# Patient Record
Sex: Male | Born: 1955
Health system: Southern US, Community
[De-identification: ages and names within clinical notes are randomized; demographics above are authoritative.]

## PROBLEM LIST (undated history)

## (undated) DIAGNOSIS — I4729 Other ventricular tachycardia: Secondary | ICD-10-CM

## (undated) DIAGNOSIS — K279 Peptic ulcer, site unspecified, unspecified as acute or chronic, without hemorrhage or perforation: Secondary | ICD-10-CM

## (undated) DIAGNOSIS — I2510 Atherosclerotic heart disease of native coronary artery without angina pectoris: Secondary | ICD-10-CM

## (undated) DIAGNOSIS — E7800 Pure hypercholesterolemia, unspecified: Secondary | ICD-10-CM

## (undated) DIAGNOSIS — R001 Bradycardia, unspecified: Secondary | ICD-10-CM

## (undated) DIAGNOSIS — I639 Cerebral infarction, unspecified: Secondary | ICD-10-CM

## (undated) DIAGNOSIS — K219 Gastro-esophageal reflux disease without esophagitis: Secondary | ICD-10-CM

## (undated) DIAGNOSIS — I493 Ventricular premature depolarization: Secondary | ICD-10-CM

## (undated) DIAGNOSIS — I471 Supraventricular tachycardia, unspecified: Secondary | ICD-10-CM

## (undated) DIAGNOSIS — N201 Calculus of ureter: Secondary | ICD-10-CM

## (undated) DIAGNOSIS — I5022 Chronic systolic (congestive) heart failure: Secondary | ICD-10-CM

## (undated) DIAGNOSIS — M109 Gout, unspecified: Secondary | ICD-10-CM

## (undated) HISTORY — PX: TOOTH EXTRACTION: SUR596

## (undated) HISTORY — DX: Chronic systolic (congestive) heart failure: I50.22

## (undated) HISTORY — PX: COLONOSCOPY: SHX174

## (undated) HISTORY — DX: Bradycardia, unspecified: R00.1

## (undated) HISTORY — DX: Atherosclerotic heart disease of native coronary artery without angina pectoris: I25.10

## (undated) HISTORY — DX: Other ventricular tachycardia: I47.29

## (undated) HISTORY — DX: Ventricular premature depolarization: I49.3

## (undated) HISTORY — DX: Supraventricular tachycardia, unspecified: I47.10

---
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
## 2001-11-01 ENCOUNTER — Ambulatory Visit (HOSPITAL_COMMUNITY): Admission: RE | Admit: 2001-11-01 | Discharge: 2001-11-01 | Payer: Self-pay | Admitting: Internal Medicine

## 2001-11-01 ENCOUNTER — Encounter: Payer: Self-pay | Admitting: Internal Medicine

## 2004-10-05 ENCOUNTER — Other Ambulatory Visit: Admission: RE | Admit: 2004-10-05 | Discharge: 2004-10-05 | Payer: Self-pay | Admitting: Dermatology

## 2004-10-08 ENCOUNTER — Emergency Department (HOSPITAL_COMMUNITY): Admission: EM | Admit: 2004-10-08 | Discharge: 2004-10-08 | Payer: Self-pay | Admitting: Emergency Medicine

## 2006-03-27 ENCOUNTER — Ambulatory Visit (HOSPITAL_COMMUNITY): Admission: RE | Admit: 2006-03-27 | Discharge: 2006-03-27 | Payer: Self-pay | Admitting: Family Medicine

## 2006-03-29 ENCOUNTER — Encounter: Payer: Self-pay | Admitting: Orthopedic Surgery

## 2006-03-29 ENCOUNTER — Ambulatory Visit (HOSPITAL_COMMUNITY): Admission: RE | Admit: 2006-03-29 | Discharge: 2006-03-29 | Payer: Self-pay | Admitting: Family Medicine

## 2006-04-21 ENCOUNTER — Inpatient Hospital Stay (HOSPITAL_COMMUNITY): Admission: EM | Admit: 2006-04-21 | Discharge: 2006-04-25 | Payer: Self-pay | Admitting: Emergency Medicine

## 2006-07-23 ENCOUNTER — Emergency Department (HOSPITAL_COMMUNITY): Admission: EM | Admit: 2006-07-23 | Discharge: 2006-07-23 | Payer: Self-pay | Admitting: Emergency Medicine

## 2006-12-06 ENCOUNTER — Ambulatory Visit (HOSPITAL_COMMUNITY): Admission: RE | Admit: 2006-12-06 | Discharge: 2006-12-06 | Payer: Self-pay | Admitting: General Surgery

## 2008-11-25 ENCOUNTER — Ambulatory Visit (HOSPITAL_COMMUNITY): Admission: RE | Admit: 2008-11-25 | Discharge: 2008-11-25 | Payer: Self-pay | Admitting: Family Medicine

## 2008-11-25 ENCOUNTER — Encounter: Payer: Self-pay | Admitting: Orthopedic Surgery

## 2008-12-03 ENCOUNTER — Ambulatory Visit: Payer: Self-pay | Admitting: Orthopedic Surgery

## 2008-12-03 DIAGNOSIS — S93409A Sprain of unspecified ligament of unspecified ankle, initial encounter: Secondary | ICD-10-CM | POA: Insufficient documentation

## 2008-12-08 ENCOUNTER — Encounter: Payer: Self-pay | Admitting: Orthopedic Surgery

## 2009-01-05 ENCOUNTER — Ambulatory Visit: Payer: Self-pay | Admitting: Orthopedic Surgery

## 2009-01-08 ENCOUNTER — Encounter: Payer: Self-pay | Admitting: Orthopedic Surgery

## 2009-04-06 ENCOUNTER — Ambulatory Visit: Payer: Self-pay | Admitting: Orthopedic Surgery

## 2009-04-06 DIAGNOSIS — G5620 Lesion of ulnar nerve, unspecified upper limb: Secondary | ICD-10-CM | POA: Insufficient documentation

## 2009-04-06 DIAGNOSIS — G5600 Carpal tunnel syndrome, unspecified upper limb: Secondary | ICD-10-CM | POA: Insufficient documentation

## 2009-04-06 DIAGNOSIS — M542 Cervicalgia: Secondary | ICD-10-CM | POA: Insufficient documentation

## 2009-04-06 DIAGNOSIS — R209 Unspecified disturbances of skin sensation: Secondary | ICD-10-CM | POA: Insufficient documentation

## 2009-04-07 ENCOUNTER — Encounter: Payer: Self-pay | Admitting: Orthopedic Surgery

## 2009-04-08 ENCOUNTER — Encounter: Payer: Self-pay | Admitting: Orthopedic Surgery

## 2009-04-08 ENCOUNTER — Encounter (INDEPENDENT_AMBULATORY_CARE_PROVIDER_SITE_OTHER): Payer: Self-pay | Admitting: *Deleted

## 2009-04-15 ENCOUNTER — Encounter: Payer: Self-pay | Admitting: Orthopedic Surgery

## 2009-05-29 ENCOUNTER — Telehealth: Payer: Self-pay | Admitting: Orthopedic Surgery

## 2010-11-20 ENCOUNTER — Emergency Department (HOSPITAL_COMMUNITY): Admission: EM | Admit: 2010-11-20 | Discharge: 2010-11-20 | Payer: Self-pay | Admitting: Emergency Medicine

## 2011-05-13 NOTE — H&P (Signed)
NAMEDIMAS, SCHECK               ACCOUNT NO.:  [id number]   MEDICAL RECORD NO.:  [id number]          PATIENT TYPE:  AMB   LOCATION:                                FACILITY:  [facility]   PHYSICIAN:  Dalia Heading, M.D.  DATE OF BIRTH:  26-Oct-1956   DATE OF ADMISSION:  12/06/2006  DATE OF DISCHARGE:  LH                              HISTORY & PHYSICAL   CHIEF COMPLAINT:  Hematochezia.   HISTORY OF PRESENT ILLNESS:  The patient is a 55 year old white male who  is referred for endoscopic evaluation.  He needs colonoscopy for  hematochezia.  He has been having intermittent blood per rectum over the  past year.  No abdominal pain, weight loss, nausea, vomiting, diarrhea,  constipation, or melena have been noted.  He has never had a  colonoscopy.  There is no family history of colon carcinoma.   PAST MEDICAL HISTORY:  Reflux disease.   PAST SURGICAL HISTORY:  Unremarkable.   CURRENT MEDICATIONS:  Prilosec.   ALLERGIES:  No known drug allergies.   REVIEW OF SYSTEMS:  Noncontributory.   PHYSICAL EXAMINATION:  GENERAL:  The patient is a well-developed, well-  nourished white male in no acute distress.  LUNGS:  Clear to auscultation with equal breath sounds bilaterally.  HEART:  Regular rate and rhythm without S3, S4, or murmurs.  ABDOMEN:  Soft, nontender, nondistended.  No hepatosplenomegaly or  masses are noted.  RECTAL:  Deferred to the procedure.   IMPRESSION:  Hematochezia.   PLAN:  The patient is scheduled for a colonoscopy on December 06, 2006.  The risks and benefits of the procedure including bleeding and  perforation were fully explained to the patient, who gave informed  consent.      Dalia Heading, M.D.  Electronically Signed     MAJ/MEDQ  D:  11/28/2006  T:  11/29/2006  Job:  [id number]   cc:   [facility]  Fax: [phone]   Patrica Duel, M.D.  Fax: [phone]

## 2011-05-13 NOTE — H&P (Signed)
NAMEMEDARD, DECUIR               ACCOUNT NO.:  [id number]   MEDICAL RECORD NO.:  [id number]           PATIENT TYPE:   LOCATION:                                FACILITY:  [facility]   PHYSICIAN:  Dirk Dress. Katrinka Blazing, M.D.   DATE OF BIRTH:  1956-09-09   DATE OF ADMISSION:  DATE OF DISCHARGE:  LH                                HISTORY & PHYSICAL   A 55 year old male admitted for treatment of suspected perforated viscous in  the retroperitoneum.  The patient gives a history of having acute onset of  upper abdominal pain which was quite severe.  The pain progressed.  It  appeared to be worse with inspiration.  It then extended down into his groin  bilaterally.  When he was seen in the emergency room, he had been  symptomatic for about 24 hours.  He was noted to be afebrile with a white  count of 11.7 and stable labs including an amylase and lipase.  CT of the  abdomen showed inflammatory process in the retroperitoneum and the anterior  pararenal space suggestive of possibly duodenal perforation but there was no  extra luminal gas.  CT of the pelvis showed extension of the inflammatory  process into the anterior pararenal space down into the mid abdomen.  The  appendix was normal.  There is no evidence of diverticulitis of the sigmoid  colon, though there was diverticulosis.  Because of the extent of the  inflammation, the patient is admitted.  Relevant history is that he had been  treated with a Medrol Dosepak and stopped taking it about four days prior to  onset of symptoms.  He was taking some Prilosec OTC but was taking it  intermittently.  He has a history of gastroesophageal reflux disease and  gives a history that suggests that he has had gastritis in the past.   PAST HISTORY:  He has no other known medical illness.   MEDICATIONS:  Prilosec OTC p.r.n. which he takes on a regular basis.   He has no known drug allergies.   No history of surgery.   REVIEW OF SYSTEMS:  Negative.   PHYSICAL EXAMINATION:  GENERAL:  The patient does not appear to be in any  acute distress.  VITAL SIGNS:  Blood pressure 1104/60, pulse 60, respirations 18, temperature  97, O2 sat is 99%.  HEENT:  Unremarkable.  NECK:  Supple without JVD, bruit, adenopathy, or thyromegaly.  CHEST:  Clear to auscultation.  HEART:  Regular rate and rhythm without murmur, gallop, or rub.  ABDOMEN:  Distended, moderate epigastric tenderness to deep palpation.  Mild  to moderate tenderness in the right flank and the right CVA region to  palpation and percussion.  Mild tenderness to palpation in the hypogastrium,  more prominent in the left lower quadrant.  He has good active bowel sounds.  EXTREMITIES:  No cyanosis, clubbing, or edema.  NEUROLOGIC:  No focal motor, sensory, or cerebellar deficit.   IMPRESSION:  Suspected acute perforation of duodenal ulcer in  retroperitoneum with a contained perforation probably exacerbated by  prednisone therapy.  RECOMMENDATIONS:  1.  Agree with treatment with Zosyn and Protonix.  Would increase Protonix      to b.i.d.  2.  After a couple of days, we should probably add some Carafate.  3.  If his abdominal distention worsens, nasogastric decompression would be      of benefit.  4.  Since there is no evidence of inflammation __________  it is unlikely      that this represents a gastric ulcer, so nasogastric decompression is      probably not going to be of any benefit.  5.  Would be very conservative in instituting any type of diet.  6.  Would suggest that he have a repeat CT in three days which will be      Monday, to verify healing.  If there is evidence of healing, would treat      him for a total of about six days and discharge him on Protonix.  If he      is totally asymptomatic, it probably will be helpful to do an EGD with      evaluation of the duodenum with minimal __________  .  This decision      will be made based on the findings on the followup  CT.      Dirk Dress. Katrinka Blazing, M.D.  Electronically Signed     LCS/MEDQ  D:  04/22/2006  T:  04/22/2006  Job:  [id number]

## 2011-05-13 NOTE — Discharge Summary (Signed)
Jeffrey Allen, Jeffrey Allen               ACCOUNT NO.:  [id number]   MEDICAL RECORD NO.:  [id number]          PATIENT TYPE:  INP   LOCATION:  A325                          FACILITY:  [facility]   PHYSICIAN:  Patrica Duel, M.D.    DATE OF BIRTH:  Oct 06, 1956   DATE OF ADMISSION:  04/21/2006  DATE OF DISCHARGE:  05/01/2007LH                                 DISCHARGE SUMMARY   DISCHARGE DIAGNOSES:  1.  Probable posterior duodenal perforation with retroperitoneal      inflammation, with dramatic recovery.  Currently asymptomatic.  CT much      improved.  2.  Mild hyperlipidemia  3.  __________ primarily involving his shoulders from recent fall, improved.  4.  History of gastroesophageal reflux disease, p.r.n. proton pump inhibitor      treatment only.   The patient presented to the emergency department for evaluation of a 24-  hour history of increasingly severe lower abdominal pain.  It was somewhat  colicky in nature and associated with having to burp.  He had a normal  bowel movement the morning of admission and denies melena, hematemesis or  hematochezia or significant nausea or vomiting.  He did not have any fever,  chills, genitourinary symptoms, chest pain, headache or neurologic deficits.   In the emergency department the patient had normal vital signs and O2  saturations.  A CBC revealed mild leukocytosis with white count of 11,700  and left shift but no band forms noted.  H&H were normal.  Chemistries were  also normal as well as amylase and lipase.  A CT scan of the pelvis not only  revealed a significant inflammatory process within the retroperitoneum  primarily within the anterior perirenal space.  Findings were suspicious for  perforation, potentially of a duodenal ulcer.  The pancreas was normal with  preserved peripancreatic fat planes. There was no evidence of extraluminal  gas.  A pelvic CT revealed extensive inflammatory process of the upper and  midpelvis as noted.  There was  no evidence of diverticulitis, and the  appendix was normal though the tip was not well-visualized.   He was admitted with the above findings.   COURSE IN THE HOSPITAL:  The patient was seen in consultation by Dr. Katrinka Blazing  while still in the emergency department.  He agreed with the planned  management of broad-spectrum antibiotics and PPIs.  No surgical intervention  was warranted.  The patient has been kept n.p.o. and has steadily improved.  His abdominal symptoms have completely resolved and his major complaint at  this time is being hungry.   A repeat CT scan of the abdomen and pelvis on April 30 revealed near-total  resolution of the inflammatory process as noted.  Incidentally noted were  bilateral pleural effusions of questionable significance.  The patient has  no pulmonary complaints whatsoever.   Currently the patient is stable and p.o.'s have been begun and have been  tolerated, and he is strongly requesting discharge so he can plant his  tobacco.  I think that this is reasonable as he clinically appears to be at  his  baseline status.   DISPOSITION:  Will continue PPIs in the form of AcipHex.  Dr. Katrinka Blazing will  comment prior to his discharge.  His diet will be liquids only for the next  three to four days, and he will call should he have any problems whatsoever.      Patrica Duel, M.D.  Electronically Signed     MC/MEDQ  D:  04/25/2006  T:  04/25/2006  Job:  [id number]

## 2011-05-13 NOTE — H&P (Signed)
Jeffrey Allen, Jeffrey Allen               ACCOUNT NO.:  [id number]   MEDICAL RECORD NO.:  [id number]          PATIENT TYPE:  INP   LOCATION:  A325                          FACILITY:  [facility]   PHYSICIAN:  Patrica Duel, M.D.    DATE OF BIRTH:  04/19/1956   DATE OF ADMISSION:  04/21/2006  DATE OF DISCHARGE:  LH                                HISTORY & PHYSICAL   CHIEF COMPLAINT:  Abdominal pain.   HISTORY OF PRESENT ILLNESS:  This is a 55 year old male with a relatively  benign past history except for hyperlipidemia recently begun on statin  therapy and orthopedic problems primarily involving his shoulders from a  recent fall.  He also has gastroesophageal reflux disease and takes p.r.n.  PPI's only and has been well controlled.   The patient presented to the emergency department for evaluation of a 24-  hour history of increasingly severe lower abdominal pain.  It was somewhat  colicky in nature and associated with having to burp.  The patient had a  normal bowel movement this morning and denies melena, hematemesis, or  significant nausea or vomiting.  He also denies any fever or chills,  genitourinary symptoms, chest pain, shortness of breath, headache, or  neurologic deficits.   In the emergency department, the patient was found to have normal vital  signs and O2 saturations.  A CBC revealed a leukocytosis with white count  11,700 and a left shift but no band forms noted.  H&H normal.  Chemistries  normal.  Amylase/lipase also normal.  A CT scan of the pelvis and abdomen  revealed a significant inflammatory process within the retroperitoneum,  primarily in the anterior pararenal space.  Findings were suspicious for  perforation potentially of a duodenal ulcer.  Of note the pancreas appeared  to be normal with preserved peripancreatic fat planes.  No definite evidence  of extraluminal gas was noted.  The pelvic CT revealed extension of the  inflammatory process into the upper and mid  pelvis as noted above.  There is  no evidence of diverticulitis and the appendix was normal though the tip is  not well visualized.   The patient is admitted with ongoing abdominal pain with findings as noted  above.  Of note the patient currently is hungry and his pain is rated 3/10.   CURRENT MEDICATIONS:  Current medications include prednisone recently given  for possible cervical radiculopathy.  Also Prilosec p.r.n.   ALLERGIES:  NONE KNOWN.   PAST HISTORY:  As noted above.   SOCIAL HISTORY:  Is a smoker.  Denies significant use of alcohol or illicit  drugs.   FAMILY HISTORY:  Noncontributory.   REVIEW OF SYSTEMS:  Negative except as mentioned.   PHYSICAL EXAMINATION:  GENERAL:  This is a very pleasant fully alert male  who is in no acute distress at this time.  VITAL SIGNS:  Temperature is 97.7 degrees, BP 119/85, pulse 55 regular,  respirations 18, O2 saturation 99%.  HEENT:  Normocephalic, atraumatic.  Pupils equal.  Ears, nose and throat are  benign.  There is no scleral icterus.  The neck is supple without masses,  thyromegaly, or lymphadenopathy.  CHEST:  Lungs are clear to A&P.  Heart sounds normal without murmurs, rubs,  or gallops.  ABDOMEN:  Mildly distended.  There is mild tenderness of both of the lower  quadrants.  There is no significant guarding or rebound.  Bowel sounds are  intact.  RECTAL:  Exam performed by ER MD revealed no significant inflammation of  prostate.  Stool reportedly Hemoccult negative.  EXTREMITIES:  No clubbing, cyanosis, or edema.  NEUROLOGIC:  Exam is within normal limits.   ASSESSMENT:  Abdominal pain with CT findings as noted, question perforation  though his exam is relatively benign, though CT findings are certainly  significant.   PLAN:  We will administer empiric Zosyn and PPI IV, hydrate and prompt  surgical consult.  Consider upper endoscopy, less likely an exploratory  laparotomy, pending his process.  We will follow and  treat expectantly.      Patrica Duel, M.D.  Electronically Signed     MC/MEDQ  D:  04/21/2006  T:  04/21/2006  Job:  [id number]

## 2015-09-04 ENCOUNTER — Encounter (HOSPITAL_COMMUNITY): Payer: Self-pay | Admitting: Emergency Medicine

## 2015-09-04 ENCOUNTER — Emergency Department (HOSPITAL_COMMUNITY): Payer: BLUE CROSS/BLUE SHIELD

## 2015-09-04 ENCOUNTER — Emergency Department (HOSPITAL_COMMUNITY)
Admission: EM | Admit: 2015-09-04 | Discharge: 2015-09-05 | Disposition: A | Discharge status: Home / Self Care | Payer: BLUE CROSS/BLUE SHIELD | Attending: Emergency Medicine | Admitting: Emergency Medicine

## 2015-09-04 DIAGNOSIS — K219 Gastro-esophageal reflux disease without esophagitis: Secondary | ICD-10-CM | POA: Insufficient documentation

## 2015-09-04 DIAGNOSIS — Z79899 Other long term (current) drug therapy: Secondary | ICD-10-CM | POA: Insufficient documentation

## 2015-09-04 DIAGNOSIS — N201 Calculus of ureter: Secondary | ICD-10-CM | POA: Diagnosis not present

## 2015-09-04 DIAGNOSIS — Z87891 Personal history of nicotine dependence: Secondary | ICD-10-CM | POA: Insufficient documentation

## 2015-09-04 DIAGNOSIS — R109 Unspecified abdominal pain: Secondary | ICD-10-CM | POA: Diagnosis present

## 2015-09-04 HISTORY — DX: Gastro-esophageal reflux disease without esophagitis: K21.9

## 2015-09-04 LAB — URINALYSIS, ROUTINE W REFLEX MICROSCOPIC
Glucose, UA: NEGATIVE mg/dL
Ketones, ur: NEGATIVE mg/dL
LEUKOCYTES UA: NEGATIVE
Nitrite: NEGATIVE
PROTEIN: 30 mg/dL — AB
Specific Gravity, Urine: >1.03 — ABNORMAL HIGH (ref 1.005–1.030)
UROBILINOGEN UA: 0.2 mg/dL (ref 0.0–1.0)
pH: 6 (ref 5.0–8.0)

## 2015-09-04 LAB — URINE MICROSCOPIC-ADD ON

## 2015-09-04 NOTE — ED Notes (Signed)
Patient c/o left flank pain that start with nausea and vomiting. Per patient nausea and vomiting from pain. Patient states pain radiates from left flank into abd. Denies any pain with urination, blood in urine, or decrease in urine flow. Per patient last BM this morning, normal-no blood noted.

## 2015-09-04 NOTE — ED Provider Notes (Signed)
CSN: [id number]     Arrival date & time 09/04/15  1800 History  This chart was scribed for Jeffrey Fuel, MD by Starleen Arms, ED Scribe. This patient was seen in room APA03/APA03 and the patient's care was started at 11:52 PM.   Chief Complaint  Patient presents with  . Flank Pain   The history is provided by the patient. No language interpreter was used.   HPI Comments: Jeffrey Allen is a 59 y.o. male who presents to the Emergency Department complaining of sudden, 20/10 left flank pain onset 3:30 pm, alleviated with position.  Associated symptoms include nausea and one episode of vomiting.  All symptoms resolved at 7:30 pm.    PCP: Dr. Nancy Fetter  Past Medical History  Diagnosis Date  . GERD (gastroesophageal reflux disease)    History reviewed. No pertinent past surgical history. History reviewed. No pertinent family history. Social History  Substance Use Topics  . Smoking status: Former Smoker -- 0.02 packs/day for 25 years    Types: Cigarettes    Quit date: 12/26/2006  . Smokeless tobacco: Never Used  . Alcohol Use: Yes     Comment: ocassional    Review of Systems A complete 10 system review of systems was obtained and all systems are negative except as noted in the HPI and PMH.    Allergies  Prednisone  Home Medications   Prior to Admission medications   Medication Sig Start Date End Date Taking? Authorizing Provider  omeprazole (PRILOSEC OTC) 20 MG tablet Take 20 mg by mouth daily.   Yes Historical Provider, MD   BP 164/100 mmHg  Pulse 65  Temp(Src) 98.3 F (36.8 C) (Oral)  Resp 20  Ht 5\' 7"  (1.702 m)  Wt 195 lb (88.451 kg)  BMI 30.53 kg/m2  SpO2 99% Physical Exam  Constitutional: He is oriented to person, place, and time. He appears well-developed and well-nourished. No distress.  HENT:  Head: Normocephalic and atraumatic.  Eyes: Conjunctivae and EOM are normal. Pupils are equal, round, and reactive to light.  Neck: Normal range of motion. Neck supple. No JVD  present.  Cardiovascular: Normal rate, regular rhythm and normal heart sounds.   No murmur heard. Pulmonary/Chest: Effort normal and breath sounds normal. He has no wheezes. He has no rales. He exhibits no tenderness.  Abdominal: Soft. Bowel sounds are normal. He exhibits no distension and no mass. There is tenderness. There is no rebound and no guarding.  Mild tenderness LLQ.  No rebound or guarding.   Musculoskeletal: Normal range of motion. He exhibits no edema.  Lymphadenopathy:    He has no cervical adenopathy.  Neurological: He is alert and oriented to person, place, and time. No cranial nerve deficit. He exhibits normal muscle tone. Coordination normal.  Skin: Skin is warm and dry. No rash noted.  Psychiatric: He has a normal mood and affect. His behavior is normal. Judgment and thought content normal.  Nursing note and vitals reviewed.   ED Course  Procedures (including critical care time)  DIAGNOSTIC STUDIES: Oxygen Saturation is 99% on RA, normal by my interpretation.    COORDINATION OF CARE:  12:00 AM Discussed dx of kidney stone.  Will prescribe anti-emetic and pain medication.  Discussed return precautions.  Will provide referrall to urology for f/u.  Patient acknowledges and agrees with plan.    Labs Review Results for orders placed or performed during the hospital encounter of 09/04/15  Urinalysis, Routine w reflex microscopic (not at [facility])  Result Value Ref  Range   Color, Urine BROWN (A) YELLOW   APPearance HAZY (A) CLEAR   Specific Gravity, Urine >1.030 (H) 1.005 - 1.030   pH 6.0 5.0 - 8.0   Glucose, UA NEGATIVE NEGATIVE mg/dL   Hgb urine dipstick LARGE (A) NEGATIVE   Bilirubin Urine SMALL (A) NEGATIVE   Ketones, ur NEGATIVE NEGATIVE mg/dL   Protein, ur 30 (A) NEGATIVE mg/dL   Urobilinogen, UA 0.2 0.0 - 1.0 mg/dL   Nitrite NEGATIVE NEGATIVE   Leukocytes, UA NEGATIVE NEGATIVE  Urine microscopic-add on  Result Value Ref Range   Squamous Epithelial / LPF RARE  RARE   WBC, UA 0-2 <3 WBC/hpf   RBC / HPF TOO NUMEROUS TO COUNT <3 RBC/hpf   Bacteria, UA FEW (A) RARE   Ct Renal Stone Study  09/04/2015   CLINICAL DATA:  LEFT flank pain which began earlier today.  EXAM: CT ABDOMEN AND PELVIS WITHOUT CONTRAST  TECHNIQUE: Multidetector CT imaging of the abdomen and pelvis was performed following the standard protocol without IV contrast.  COMPARISON:  CT abdomen pelvis with contrast 04/24/2006.  FINDINGS: Normal RIGHT-sided Ing system.  No RIGHT renal abnormalities.  Moderate LEFT hydronephrosis and hydroureter secondary to a partially obstructing 3 mm stone LEFT mid ureter. No other calculi are seen. Otherwise normal LEFT kidney.  Unremarkable unenhanced appearance to the upper abdominal organs. No bowel obstruction. Normal appendix. Unremarkable bladder. Fat containing inguinal hernias.  IMPRESSION: LEFT hydronephrosis and hydroureter secondary to a partially obstructing 3 mm LEFT ureteral stone.   Electronically Signed   By: Staci Righter M.D.   On: 09/04/2015 21:29     Imaging Review Ct Renal Stone Study  09/04/2015   CLINICAL DATA:  LEFT flank pain which began earlier today.  EXAM: CT ABDOMEN AND PELVIS WITHOUT CONTRAST  TECHNIQUE: Multidetector CT imaging of the abdomen and pelvis was performed following the standard protocol without IV contrast.  COMPARISON:  CT abdomen pelvis with contrast 04/24/2006.  FINDINGS: Normal RIGHT-sided Ing system.  No RIGHT renal abnormalities.  Moderate LEFT hydronephrosis and hydroureter secondary to a partially obstructing 3 mm stone LEFT mid ureter. No other calculi are seen. Otherwise normal LEFT kidney.  Unremarkable unenhanced appearance to the upper abdominal organs. No bowel obstruction. Normal appendix. Unremarkable bladder. Fat containing inguinal hernias.  IMPRESSION: LEFT hydronephrosis and hydroureter secondary to a partially obstructing 3 mm LEFT ureteral stone.   Electronically Signed   By: Staci Righter M.D.   On:  09/04/2015 21:29   I have personally reviewed and evaluated these images and lab results as part of my medical decision-making.   MDM   Final diagnoses:  Ureteral calculus    Flank pain consistent with ureteral colic. CT renal stone study is obtained showing 3 mm calculus in the left mid ureter. He has been pain-free since arriving in the ED, so no analgesia 6 had been given. The stone is small enough that it should pass without any kind of intervention. He is discharged with prescriptions for naproxen, oxycodone-acetaminophen, tamsulosin, and ondansetron. He is referred to urology for follow-up.  I personally performed the services described in this documentation, which was scribed in my presence. The recorded information has been reviewed and is accurate.     Jeffrey Fuel, MD 69/62/95 [id number]

## 2015-09-04 NOTE — ED Notes (Signed)
Pt states he was having sudden onset of sharp flank pain on the left side this afternoon. Not currently having pain.

## 2015-09-05 MED ORDER — ONDANSETRON HCL 4 MG PO TABS: 4.0000 mg | ORAL_TABLET | Freq: Four times a day (QID) | ORAL | Status: DC | PRN

## 2015-09-05 MED ORDER — OXYCODONE-ACETAMINOPHEN 5-325 MG PO TABS: 1.0000 | ORAL_TABLET | ORAL | Status: DC | PRN

## 2015-09-05 MED ORDER — TAMSULOSIN HCL 0.4 MG PO CAPS: 0.4000 mg | ORAL_CAPSULE | Freq: Every day | ORAL | Status: DC

## 2015-09-05 MED ORDER — NAPROXEN 500 MG PO TABS: 500.0000 mg | ORAL_TABLET | Freq: Two times a day (BID) | ORAL | Status: DC

## 2015-09-05 MED ORDER — IBUPROFEN 800 MG PO TABS
800.0000 mg | ORAL_TABLET | Freq: Once | ORAL | Status: AC
  Administered 2015-09-05: 800 mg via ORAL
  Filled 2015-09-05: qty 1

## 2015-09-05 NOTE — Discharge Instructions (Signed)
Return if pain is not being adequately controlled at home, or if you start running a fever.  Kidney Stones Kidney stones (urolithiasis) are deposits that form inside your kidneys. The intense pain is caused by the stone moving through the urinary tract. When the stone moves, the ureter goes into spasm around the stone. The stone is usually passed in the urine.  CAUSES   A disorder that makes certain neck glands produce too much parathyroid hormone (primary hyperparathyroidism).  A buildup of uric acid crystals, similar to gout in your joints.  Narrowing (stricture) of the ureter.  A kidney obstruction present at birth (congenital obstruction).  Previous surgery on the kidney or ureters.  Numerous kidney infections. SYMPTOMS   Feeling sick to your stomach (nauseous).  Throwing up (vomiting).  Blood in the urine (hematuria).  Pain that usually spreads (radiates) to the groin.  Frequency or urgency of urination. DIAGNOSIS   Taking a history and physical exam.  Blood or urine tests.  CT scan.  Occasionally, an examination of the inside of the urinary bladder (cystoscopy) is performed. TREATMENT   Observation.  Increasing your fluid intake.  Extracorporeal shock wave lithotripsy--This is a noninvasive procedure that uses shock waves to break up kidney stones.  Surgery may be needed if you have severe pain or persistent obstruction. There are various surgical procedures. Most of the procedures are performed with the use of small instruments. Only small incisions are needed to accommodate these instruments, so recovery time is minimized. The size, location, and chemical composition are all important variables that will determine the proper choice of action for you. Talk to your health care provider to better understand your situation so that you will minimize the risk of injury to yourself and your kidney.  HOME CARE INSTRUCTIONS   Drink enough water and fluids to keep your  urine clear or pale yellow. This will help you to pass the stone or stone fragments.  Strain all urine through the provided strainer. Keep all particulate matter and stones for your health care provider to see. The stone causing the pain may be as small as a grain of salt. It is very important to use the strainer each and every time you pass your urine. The collection of your stone will allow your health care provider to analyze it and verify that a stone has actually passed. The stone analysis will often identify what you can do to reduce the incidence of recurrences.  Only take over-the-counter or prescription medicines for pain, discomfort, or fever as directed by your health care provider.  Make a follow-up appointment with your health care provider as directed.  Get follow-up X-rays if required. The absence of pain does not always mean that the stone has passed. It may have only stopped moving. If the urine remains completely obstructed, it can cause loss of kidney function or even complete destruction of the kidney. It is your responsibility to make sure X-rays and follow-ups are completed. Ultrasounds of the kidney can show blockages and the status of the kidney. Ultrasounds are not associated with any radiation and can be performed easily in a matter of minutes. SEEK MEDICAL CARE IF:  You experience pain that is progressive and unresponsive to any pain medicine you have been prescribed. SEEK IMMEDIATE MEDICAL CARE IF:   Pain cannot be controlled with the prescribed medicine.  You have a fever or shaking chills.  The severity or intensity of pain increases over 18 hours and is not relieved by  pain medicine.  You develop a new onset of abdominal pain.  You feel faint or pass out.  You are unable to urinate. MAKE SURE YOU:   Understand these instructions.  Will watch your condition.  Will get help right away if you are not doing well or get worse. Document Released: 12/12/2005  Document Revised: 08/14/2013 Document Reviewed: 05/15/2013 [facility] Patient Information 2015 [place], [place]. This information is not intended to replace advice given to you by your health care provider. Make sure you discuss any questions you have with your health care provider.  Tamsulosin capsules What is this medicine? TAMSULOSIN (tam SOO loe sin) is used to treat enlargement of the prostate gland in men, a condition called benign prostatic hyperplasia or BPH. It is not for use in women. It works by relaxing muscles in the prostate and bladder neck. This improves urine flow and reduces BPH symptoms. This medicine may be used for other purposes; ask your health care provider or pharmacist if you have questions. COMMON BRAND NAME(S): Flomax What should I tell my health care provider before I take this medicine? They need to know if you have any of the following conditions: -advanced kidney disease -advanced liver disease -low blood pressure -prostate cancer -an unusual or allergic reaction to tamsulosin, sulfa drugs, other medicines, foods, dyes, or preservatives -pregnant or trying to get pregnant -breast-feeding How should I use this medicine? Take this medicine by mouth about 30 minutes after the same meal every day. Follow the directions on the prescription label. Swallow the capsules whole with a glass of water. Do not crush, chew, or open capsules. Do not take your medicine more often than directed. Do not stop taking your medicine unless your doctor tells you to. Talk to your pediatrician regarding the use of this medicine in children. Special care may be needed. Overdosage: If you think you have taken too much of this medicine contact a poison control center or emergency room at once. NOTE: This medicine is only for you. Do not share this medicine with others. What if I miss a dose? If you miss a dose, take it as soon as you can. If it is almost time for your next dose, take only  that dose. Do not take double or extra doses. If you stop taking your medicine for several days or more, ask your doctor or health care professional what dose you should start back on. What may interact with this medicine? -cimetidine -fluoxetine -ketoconazole -medicines for erectile disfunction like sildenafil, tadalafil, vardenafil -medicines for high blood pressure -other alpha-blockers like alfuzosin, doxazosin, phentolamine, phenoxybenzamine, prazosin, terazosin -warfarin This list may not describe all possible interactions. Give your health care provider a list of all the medicines, herbs, non-prescription drugs, or dietary supplements you use. Also tell them if you smoke, drink alcohol, or use illegal drugs. Some items may interact with your medicine. What should I watch for while using this medicine? Visit your doctor or health care professional for regular check ups. You will need lab work done before you start this medicine and regularly while you are taking it. Check your blood pressure as directed. Ask your health care professional what your blood pressure should be, and when you should contact him or her. This medicine may make you feel dizzy or lightheaded. This is more likely to happen after the first dose, after an increase in dose, or during hot weather or exercise. Drinking alcohol and taking some medicines can make this worse. Do not drive,  use machinery, or do anything that needs mental alertness until you know how this medicine affects you. Do not sit or stand up quickly. If you begin to feel dizzy, sit down until you feel better. These effects can decrease once your body adjusts to the medicine. Contact your doctor or health care professional right away if you have an erection that lasts longer than 4 hours or if it becomes painful. This may be a sign of a serious problem and must be treated right away to prevent permanent damage. If you are thinking of having cataract surgery,  tell your eye surgeon that you have taken this medicine. What side effects may I notice from receiving this medicine? Side effects that you should report to your doctor or health care professional as soon as possible: -allergic reactions like skin rash or itching, hives, swelling of the lips, mouth, tongue, or throat -breathing problems -change in vision -feeling faint or lightheaded -irregular heartbeat -prolonged or painful erection -weakness Side effects that usually do not require medical attention (report to your doctor or health care professional if they continue or are bothersome): -back pain -change in sex drive or performance -constipation, nausea or vomiting -cough -drowsy -runny or stuffy nose -trouble sleeping This list may not describe all possible side effects. Call your doctor for medical advice about side effects. You may report side effects to FDA at 1-800-FDA-1088. Where should I keep my medicine? Keep out of the reach of children. Store at room temperature between 15 and 30 degrees C (59 and 86 degrees F). Throw away any unused medicine after the expiration date. NOTE: This sheet is a summary. It may not cover all possible information. If you have questions about this medicine, talk to your doctor, pharmacist, or health care provider.  2015, Elsevier/Gold Standard. (2012-12-12 14:11:34)  Naproxen and naproxen sodium oral immediate-release tablets What is this medicine? NAPROXEN (na PROX en) is a non-steroidal anti-inflammatory drug (NSAID). It is used to reduce swelling and to treat pain. This medicine may be used for dental pain, headache, or painful monthly periods. It is also used for painful joint and muscular problems such as arthritis, tendinitis, bursitis, and gout. This medicine may be used for other purposes; ask your health care provider or pharmacist if you have questions. COMMON BRAND NAME(S): Aflaxen, Aleve, Aleve Arthritis, All Day Relief, Anaprox,  Anaprox DS, Naprosyn What should I tell my health care provider before I take this medicine? They need to know if you have any of these conditions: -asthma -cigarette smoker -drink more than 3 alcohol containing drinks a day -heart disease or circulation problems such as heart failure or leg edema (fluid retention) -high blood pressure -kidney disease -liver disease -stomach bleeding or ulcers -an unusual or allergic reaction to naproxen, aspirin, other NSAIDs, other medicines, foods, dyes, or preservatives -pregnant or trying to get pregnant -breast-feeding How should I use this medicine? Take this medicine by mouth with a glass of water. Follow the directions on the prescription label. Take it with food if your stomach gets upset. Try to not lie down for at least 10 minutes after you take it. Take your medicine at regular intervals. Do not take your medicine more often than directed. Long-term, continuous use may increase the risk of heart attack or stroke. A special MedGuide will be given to you by the pharmacist with each prescription and refill. Be sure to read this information carefully each time. Talk to your pediatrician regarding the use of this medicine in  children. Special care may be needed. Overdosage: If you think you have taken too much of this medicine contact a poison control center or emergency room at once. NOTE: This medicine is only for you. Do not share this medicine with others. What if I miss a dose? If you miss a dose, take it as soon as you can. If it is almost time for your next dose, take only that dose. Do not take double or extra doses. What may interact with this medicine? -alcohol -aspirin -cidofovir -diuretics -lithium -methotrexate -other drugs for inflammation like ketorolac or prednisone -pemetrexed -probenecid -warfarin This list may not describe all possible interactions. Give your health care provider a list of all the medicines, herbs,  non-prescription drugs, or dietary supplements you use. Also tell them if you smoke, drink alcohol, or use illegal drugs. Some items may interact with your medicine. What should I watch for while using this medicine? Tell your doctor or health care professional if your pain does not get better. Talk to your doctor before taking another medicine for pain. Do not treat yourself. This medicine does not prevent heart attack or stroke. In fact, this medicine may increase the chance of a heart attack or stroke. The chance may increase with longer use of this medicine and in people who have heart disease. If you take aspirin to prevent heart attack or stroke, talk with your doctor or health care professional. Do not take other medicines that contain aspirin, ibuprofen, or naproxen with this medicine. Side effects such as stomach upset, nausea, or ulcers may be more likely to occur. Many medicines available without a prescription should not be taken with this medicine. This medicine can cause ulcers and bleeding in the stomach and intestines at any time during treatment. Do not smoke cigarettes or drink alcohol. These increase irritation to your stomach and can make it more susceptible to damage from this medicine. Ulcers and bleeding can happen without warning symptoms and can cause death. You may get drowsy or dizzy. Do not drive, use machinery, or do anything that needs mental alertness until you know how this medicine affects you. Do not stand or sit up quickly, especially if you are an older patient. This reduces the risk of dizzy or fainting spells. This medicine can cause you to bleed more easily. Try to avoid damage to your teeth and gums when you brush or floss your teeth. What side effects may I notice from receiving this medicine? Side effects that you should report to your doctor or health care professional as soon as possible: -black or bloody stools, blood in the urine or vomit -blurred  vision -chest pain -difficulty breathing or wheezing -nausea or vomiting -severe stomach pain -skin rash, skin redness, blistering or peeling skin, hives, or itching -slurred speech or weakness on one side of the body -swelling of eyelids, throat, lips -unexplained weight gain or swelling -unusually weak or tired -yellowing of eyes or skin Side effects that usually do not require medical attention (report to your doctor or health care professional if they continue or are bothersome): -constipation -headache -heartburn This list may not describe all possible side effects. Call your doctor for medical advice about side effects. You may report side effects to FDA at 1-800-FDA-1088. Where should I keep my medicine? Keep out of the reach of children. Store at room temperature between 15 and 30 degrees C (59 and 86 degrees F). Keep container tightly closed. Throw away any unused medicine after the expiration date.  NOTE: This sheet is a summary. It may not cover all possible information. If you have questions about this medicine, talk to your doctor, pharmacist, or health care provider.  2015, Elsevier/Gold Standard. (2009-12-14 20:10:16)  Acetaminophen; Oxycodone tablets What is this medicine? ACETAMINOPHEN; OXYCODONE (a set a MEE noe fen; ox i KOE done) is a pain reliever. It is used to treat mild to moderate pain. This medicine may be used for other purposes; ask your health care provider or pharmacist if you have questions. COMMON BRAND NAME(S): Endocet, Magnacet, Narvox, Percocet, Perloxx, Primalev, Primlev, Roxicet, Xolox What should I tell my health care provider before I take this medicine? They need to know if you have any of these conditions: -brain tumor -Crohn's disease, inflammatory bowel disease, or ulcerative colitis -drug abuse or addiction -head injury -heart or circulation problems -if you often drink alcohol -kidney disease or problems going to the bathroom -liver  disease -lung disease, asthma, or breathing problems -an unusual or allergic reaction to acetaminophen, oxycodone, other opioid analgesics, other medicines, foods, dyes, or preservatives -pregnant or trying to get pregnant -breast-feeding How should I use this medicine? Take this medicine by mouth with a full glass of water. Follow the directions on the prescription label. Take your medicine at regular intervals. Do not take your medicine more often than directed. Talk to your pediatrician regarding the use of this medicine in children. Special care may be needed. Patients over 9 years old may have a stronger reaction and need a smaller dose. Overdosage: If you think you have taken too much of this medicine contact a poison control center or emergency room at once. NOTE: This medicine is only for you. Do not share this medicine with others. What if I miss a dose? If you miss a dose, take it as soon as you can. If it is almost time for your next dose, take only that dose. Do not take double or extra doses. What may interact with this medicine? -alcohol -antihistamines -barbiturates like amobarbital, butalbital, butabarbital, methohexital, pentobarbital, phenobarbital, thiopental, and secobarbital -benztropine -drugs for bladder problems like solifenacin, trospium, oxybutynin, tolterodine, hyoscyamine, and methscopolamine -drugs for breathing problems like ipratropium and tiotropium -drugs for certain stomach or intestine problems like propantheline, homatropine methylbromide, glycopyrrolate, atropine, belladonna, and dicyclomine -general anesthetics like etomidate, ketamine, nitrous oxide, propofol, desflurane, enflurane, halothane, isoflurane, and sevoflurane -medicines for depression, anxiety, or psychotic disturbances -medicines for sleep -muscle relaxants -naltrexone -narcotic medicines (opiates) for pain -phenothiazines like perphenazine, thioridazine, chlorpromazine, mesoridazine,  fluphenazine, prochlorperazine, promazine, and trifluoperazine -scopolamine -tramadol -trihexyphenidyl This list may not describe all possible interactions. Give your health care provider a list of all the medicines, herbs, non-prescription drugs, or dietary supplements you use. Also tell them if you smoke, drink alcohol, or use illegal drugs. Some items may interact with your medicine. What should I watch for while using this medicine? Tell your doctor or health care professional if your pain does not go away, if it gets worse, or if you have new or a different type of pain. You may develop tolerance to the medicine. Tolerance means that you will need a higher dose of the medication for pain relief. Tolerance is normal and is expected if you take this medicine for a long time. Do not suddenly stop taking your medicine because you may develop a severe reaction. Your body becomes used to the medicine. This does NOT mean you are addicted. Addiction is a behavior related to getting and using a drug for a non-medical  reason. If you have pain, you have a medical reason to take pain medicine. Your doctor will tell you how much medicine to take. If your doctor wants you to stop the medicine, the dose will be slowly lowered over time to avoid any side effects. You may get drowsy or dizzy. Do not drive, use machinery, or do anything that needs mental alertness until you know how this medicine affects you. Do not stand or sit up quickly, especially if you are an older patient. This reduces the risk of dizzy or fainting spells. Alcohol may interfere with the effect of this medicine. Avoid alcoholic drinks. There are different types of narcotic medicines (opiates) for pain. If you take more than one type at the same time, you may have more side effects. Give your health care provider a list of all medicines you use. Your doctor will tell you how much medicine to take. Do not take more medicine than directed. Call  emergency for help if you have problems breathing. The medicine will cause constipation. Try to have a bowel movement at least every 2 to 3 days. If you do not have a bowel movement for 3 days, call your doctor or health care professional. Do not take Tylenol (acetaminophen) or medicines that have acetaminophen with this medicine. Too much acetaminophen can be very dangerous. Many nonprescription medicines contain acetaminophen. Always read the labels carefully to avoid taking more acetaminophen. What side effects may I notice from receiving this medicine? Side effects that you should report to your doctor or health care professional as soon as possible: -allergic reactions like skin rash, itching or hives, swelling of the face, lips, or tongue -breathing difficulties, wheezing -confusion -light headedness or fainting spells -severe stomach pain -unusually weak or tired -yellowing of the skin or the whites of the eyes Side effects that usually do not require medical attention (report to your doctor or health care professional if they continue or are bothersome): -dizziness -drowsiness -nausea -vomiting This list may not describe all possible side effects. Call your doctor for medical advice about side effects. You may report side effects to FDA at 1-800-FDA-1088. Where should I keep my medicine? Keep out of the reach of children. This medicine can be abused. Keep your medicine in a safe place to protect it from theft. Do not share this medicine with anyone. Selling or giving away this medicine is dangerous and against the law. Store at room temperature between 20 and 25 degrees C (68 and 77 degrees F). Keep container tightly closed. Protect from light. This medicine may cause accidental overdose and death if it is taken by other adults, children, or pets. Flush any unused medicine down the toilet to reduce the chance of harm. Do not use the medicine after the expiration date. NOTE: This sheet  is a summary. It may not cover all possible information. If you have questions about this medicine, talk to your doctor, pharmacist, or health care provider.  2015, [employer]vier/Gold Standard. (2013-08-05 13:17:35)  Ondansetron tablets What is this medicine? ONDANSETRON (on DAN se tron) is used to treat nausea and vomiting caused by chemotherapy. It is also used to prevent or treat nausea and vomiting after surgery. This medicine may be used for other purposes; ask your health care provider or pharmacist if you have questions. COMMON BRAND NAME(S): Zofran What should I tell my health care provider before I take this medicine? They need to know if you have any of these conditions: -heart disease -history of irregular  heartbeat -liver disease -low levels of magnesium or potassium in the blood -an unusual or allergic reaction to ondansetron, granisetron, other medicines, foods, dyes, or preservatives -pregnant or trying to get pregnant -breast-feeding How should I use this medicine? Take this medicine by mouth with a glass of water. Follow the directions on your prescription label. Take your doses at regular intervals. Do not take your medicine more often than directed. Talk to your pediatrician regarding the use of this medicine in children. Special care may be needed. Overdosage: If you think you have taken too much of this medicine contact a poison control center or emergency room at once. NOTE: This medicine is only for you. Do not share this medicine with others. What if I miss a dose? If you miss a dose, take it as soon as you can. If it is almost time for your next dose, take only that dose. Do not take double or extra doses. What may interact with this medicine? Do not take this medicine with any of the following medications: -apomorphine -certain medicines for fungal infections like fluconazole, itraconazole, ketoconazole, posaconazole,  voriconazole -cisapride -dofetilide -dronedarone -pimozide -thioridazine -ziprasidone This medicine may also interact with the following medications: -carbamazepine -certain medicines for depression, anxiety, or psychotic disturbances -fentanyl -linezolid -MAOIs like Carbex, Eldepryl, Marplan, Nardil, and Parnate -methylene blue (injected into a vein) -other medicines that prolong the QT interval (cause an abnormal heart rhythm) -phenytoin -rifampicin -tramadol This list may not describe all possible interactions. Give your health care provider a list of all the medicines, herbs, non-prescription drugs, or dietary supplements you use. Also tell them if you smoke, drink alcohol, or use illegal drugs. Some items may interact with your medicine. What should I watch for while using this medicine? Check with your doctor or health care professional right away if you have any sign of an allergic reaction. What side effects may I notice from receiving this medicine? Side effects that you should report to your doctor or health care professional as soon as possible: -allergic reactions like skin rash, itching or hives, swelling of the face, lips or tongue -breathing problems -confusion -dizziness -fast or irregular heartbeat -feeling faint or lightheaded, falls -fever and chills -loss of balance or coordination -seizures -sweating -swelling of the hands or feet -tightness in the chest -tremors -unusually weak or tired Side effects that usually do not require medical attention (report to your doctor or health care professional if they continue or are bothersome): -constipation or diarrhea -headache This list may not describe all possible side effects. Call your doctor for medical advice about side effects. You may report side effects to FDA at 1-800-FDA-1088. Where should I keep my medicine? Keep out of the reach of children. Store between 2 and 30 degrees C (36 and 86 degrees F).  Throw away any unused medicine after the expiration date. NOTE: This sheet is a summary. It may not cover all possible information. If you have questions about this medicine, talk to your doctor, pharmacist, or health care provider.  2015, Elsevier/Gold Standard. (2013-09-18 16:27:45)

## 2015-09-05 NOTE — ED Notes (Signed)
Pt states understanding of care given and discharge instructions.  To follow up with urology.  Pt ambulated from ED

## 2015-09-08 MED FILL — Oxycodone w/ Acetaminophen Tab 5-325 MG: ORAL | Qty: 6 | Status: AC

## 2015-09-08 MED FILL — Ondansetron HCl Tab 4 MG: ORAL | Qty: 4 | Status: AC

## 2016-05-18 ENCOUNTER — Telehealth: Payer: Self-pay

## 2016-05-18 NOTE — Telephone Encounter (Signed)
Pt is due for his 10 yr colonoscopy. Please call either his wife at [phone] or his cell [phone]

## 2016-05-20 ENCOUNTER — Telehealth: Payer: Self-pay

## 2016-05-20 NOTE — Telephone Encounter (Signed)
Triaged pt. He was busy and will call later with insurance information.

## 2016-06-08 NOTE — Telephone Encounter (Signed)
Letter mailed to pt to call to complete triage.

## 2016-06-15 NOTE — Telephone Encounter (Signed)
Letter was mailed to pt on 06/08/2016 to call to complete triage,

## 2016-11-01 ENCOUNTER — Telehealth: Payer: Self-pay

## 2016-11-01 NOTE — Telephone Encounter (Signed)
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 Please call [phone]

## 2016-11-10 NOTE — Telephone Encounter (Signed)
Pt is busy and will call me back later.

## 2016-11-11 ENCOUNTER — Telehealth: Payer: Self-pay

## 2016-11-11 NOTE — Telephone Encounter (Signed)
See separate triage.  

## 2016-11-23 NOTE — Telephone Encounter (Signed)
Gastroenterology Pre-Procedure Review  Request Date: 11/11/2016 Requesting Physician:   PATIENT REVIEW QUESTIONS: The patient responded to the following health history questions as indicated:    1. Diabetes Melitis: no 2. Joint replacements in the past 12 months: no 3. Major health problems in the past 3 months: no 4. Has an artificial valve or MVP: no 5. Has a defibrillator: no 6. Has been advised in past to take antibiotics in advance of a procedure like teeth cleaning: no 7. Family history of colon cancer: no  8. Alcohol Use: occasionally 9. History of sleep apnea: no  10. History of coronary artery or other vascular stents placed within the last 12 months: no    MEDICATIONS & ALLERGIES:    Patient reports the following regarding taking any blood thinners:   Plavix? no Aspirin? no Coumadin? no Brilinta? no Xarelto? no Eliquis? no Pradaxa? no Savaysa? no Effient? no  Patient confirms/reports the following medications:  Current Outpatient Prescriptions  Medication Sig Dispense Refill  . allopurinol (ZYLOPRIM) 100 MG tablet Take 100 mg by mouth daily.    Marland Kitchen omeprazole (PRILOSEC OTC) 20 MG tablet Take 20 mg by mouth daily.    . naproxen (NAPROSYN) 500 MG tablet Take 1 tablet (500 mg total) by mouth 2 (two) times daily. (Patient not taking: Reported on 11/11/2016) 30 tablet 0  . tamsulosin (FLOMAX) 0.4 MG CAPS capsule Take 1 capsule (0.4 mg total) by mouth daily. (Patient not taking: Reported on 11/11/2016) 10 capsule 0   No current facility-administered medications for this visit.     Patient confirms/reports the following allergies:  Allergies  Allergen Reactions  . Prednisone Other (See Comments)    Peptic ulcers      No orders of the defined types were placed in this encounter.   AUTHORIZATION INFORMATION Primary Insurance:   ID #:   Group #:  Pre-Cert / Auth required:  Pre-Cert / Auth #:   Secondary Insurance:  ID #:  Group #:  Pre-Cert / Auth required:   Pre-Cert / Auth #:   SCHEDULE INFORMATION: Procedure has been scheduled as follows:  Date:              Time:   Location:   This Gastroenterology Pre-Precedure Review Form is being routed to the following provider(s): R. Garfield Cornea, MD

## 2016-11-23 NOTE — Telephone Encounter (Signed)
Ok to schedule.

## 2016-11-24 ENCOUNTER — Other Ambulatory Visit: Payer: Self-pay

## 2016-11-24 DIAGNOSIS — Z1211 Encounter for screening for malignant neoplasm of colon: Secondary | ICD-10-CM

## 2016-11-24 MED ORDER — PEG 3350-KCL-NA BICARB-NACL 420 G PO SOLR: 4000.0000 mL | ORAL | 0 refills | Status: DC

## 2016-11-24 NOTE — Telephone Encounter (Signed)
Pt has been scheduled for 12/07/2016 at 9:15 Am with Dr. Gala Romney.  Rx sent to the pharmacy and instructions mailed to pt.

## 2016-12-07 ENCOUNTER — Encounter (HOSPITAL_COMMUNITY): Payer: Self-pay | Admitting: *Deleted

## 2016-12-07 ENCOUNTER — Ambulatory Visit (HOSPITAL_COMMUNITY)
Admission: RE | Admit: 2016-12-07 | Discharge: 2016-12-07 | Disposition: A | Discharge status: Home / Self Care | Payer: BLUE CROSS/BLUE SHIELD | Source: Ambulatory Visit | Attending: Internal Medicine | Admitting: Internal Medicine

## 2016-12-07 ENCOUNTER — Encounter (HOSPITAL_COMMUNITY)
Admission: RE | Disposition: A | Discharge status: Home / Self Care | Payer: Self-pay | Source: Ambulatory Visit | Attending: Internal Medicine

## 2016-12-07 DIAGNOSIS — K5730 Diverticulosis of large intestine without perforation or abscess without bleeding: Secondary | ICD-10-CM | POA: Diagnosis not present

## 2016-12-07 DIAGNOSIS — Z87891 Personal history of nicotine dependence: Secondary | ICD-10-CM | POA: Insufficient documentation

## 2016-12-07 DIAGNOSIS — Z1212 Encounter for screening for malignant neoplasm of rectum: Secondary | ICD-10-CM

## 2016-12-07 DIAGNOSIS — Z1211 Encounter for screening for malignant neoplasm of colon: Secondary | ICD-10-CM | POA: Diagnosis not present

## 2016-12-07 DIAGNOSIS — Z8711 Personal history of peptic ulcer disease: Secondary | ICD-10-CM | POA: Insufficient documentation

## 2016-12-07 DIAGNOSIS — M109 Gout, unspecified: Secondary | ICD-10-CM | POA: Diagnosis not present

## 2016-12-07 DIAGNOSIS — Z79899 Other long term (current) drug therapy: Secondary | ICD-10-CM | POA: Diagnosis not present

## 2016-12-07 DIAGNOSIS — D122 Benign neoplasm of ascending colon: Secondary | ICD-10-CM | POA: Diagnosis not present

## 2016-12-07 DIAGNOSIS — K635 Polyp of colon: Secondary | ICD-10-CM | POA: Diagnosis not present

## 2016-12-07 DIAGNOSIS — D125 Benign neoplasm of sigmoid colon: Secondary | ICD-10-CM | POA: Diagnosis not present

## 2016-12-07 DIAGNOSIS — K219 Gastro-esophageal reflux disease without esophagitis: Secondary | ICD-10-CM | POA: Insufficient documentation

## 2016-12-07 HISTORY — DX: Gout, unspecified: M10.9

## 2016-12-07 HISTORY — DX: Peptic ulcer, site unspecified, unspecified as acute or chronic, without hemorrhage or perforation: K27.9

## 2016-12-07 HISTORY — PX: COLONOSCOPY: SHX5424

## 2016-12-07 SURGERY — COLONOSCOPY
Anesthesia: Moderate Sedation

## 2016-12-07 MED ORDER — SODIUM CHLORIDE 0.9 % IV SOLN
INTRAVENOUS | Status: DC
  Administered 2016-12-07: 09:00:00 via INTRAVENOUS

## 2016-12-07 MED ORDER — MIDAZOLAM HCL 5 MG/5ML IJ SOLN
INTRAMUSCULAR | Status: DC | PRN
  Administered 2016-12-07: 1 mg via INTRAVENOUS
  Administered 2016-12-07: 2 mg via INTRAVENOUS

## 2016-12-07 MED ORDER — MEPERIDINE HCL 100 MG/ML IJ SOLN
INTRAMUSCULAR | Status: DC | PRN
  Administered 2016-12-07: 25 mg via INTRAVENOUS
  Administered 2016-12-07: 50 mg via INTRAVENOUS

## 2016-12-07 MED ORDER — SIMETHICONE 40 MG/0.6ML PO SUSP
ORAL | Status: DC | PRN
  Administered 2016-12-07: 2.5 mL

## 2016-12-07 MED ORDER — ONDANSETRON HCL 4 MG/2ML IJ SOLN
INTRAMUSCULAR | Status: AC
  Filled 2016-12-07: qty 2

## 2016-12-07 MED ORDER — MEPERIDINE HCL 100 MG/ML IJ SOLN
INTRAMUSCULAR | Status: AC
  Filled 2016-12-07: qty 2

## 2016-12-07 MED ORDER — MIDAZOLAM HCL 5 MG/5ML IJ SOLN
INTRAMUSCULAR | Status: AC
  Filled 2016-12-07: qty 10

## 2016-12-07 NOTE — Op Note (Signed)
[facility] Patient Name: Jeffrey Allen Procedure Date: 12/07/2016 9:09 AM MRN: [id number] Date of Birth: 02-17-1956 Attending MD: Norvel Richards , MD CSN: [id number] Age: 60 Admit Type: Outpatient Procedure:                ileo-colonoscopy with multiple snare polyptomies Indications:              Screening for colorectal malignant neoplasm Providers:                Norvel Richards, MD, Charlyne Petrin RN, RN,                            Purcell Nails. Sterling, Merchant [employer] officer Referring MD:              Medicines:                Midazolam 3 mg IV, Meperidine 75 mg IV Complications:            No immediate complications. Estimated Blood Loss:     Estimated blood loss was minimal. Procedure:                Pre-Anesthesia Assessment:                           - Prior to the procedure, a History and Physical                            was performed, and patient medications and                            allergies were reviewed. The patient's tolerance of                            previous anesthesia was also reviewed. The risks                            and benefits of the procedure and the sedation                            options and risks were discussed with the patient.                            All questions were answered, and informed consent                            was obtained. Prior Anticoagulants: The patient has                            taken no previous anticoagulant or antiplatelet                            agents. ASA Grade Assessment: II - A patient with                            mild systemic disease. After reviewing the risks  and benefits, the patient was deemed in                            satisfactory condition to undergo the procedure.                           After obtaining informed consent, the colonoscope                            was passed under direct vision. Throughout the                            procedure, the  patient's blood pressure, pulse, and                            oxygen saturations were monitored continuously. The                            Colonoscope was introduced through the anus and                            advanced to the 5 cm into the ileum. The                            colonoscopy was performed without difficulty. The                            patient tolerated the procedure well. The quality                            of the bowel preparation was adequate. The terminal                            ileum, ileocecal valve, appendiceal orifice, and                            rectum were photographed. Scope In: 9:23:53 AM Scope Out: 9:45:50 AM Scope Withdrawal Time: 0 hours 15 minutes 56 seconds  Total Procedure Duration: 0 hours 21 minutes 57 seconds  Findings:      The perianal and digital rectal examinations were normal.      Scattered small and large-mouthed diverticula were found in the sigmoid       colon and descending colon.      Six semi-pedunculated polyps were found in the sigmoid colon and       ascending colon (3 in each segment). The polyps were 4 to 6 mm in size.       These polyps were removed with a cold snare. Resection and retrieval       were complete. Estimated blood loss was minimal. The distal 5 cm of       terminal ileal mucosa appeared normal as well. Impression:               - Diverticulosis in the sigmoid colon and in the  descending colon.                           - Six 4 to 6 mm polyps in the sigmoid colon and in                            the ascending colon, removed with a cold snare.                            Resected and retrieved. Moderate Sedation:      Moderate (conscious) sedation was administered by the endoscopy nurse       and supervised by the endoscopist. The following parameters were       monitored: oxygen saturation, heart rate, blood pressure, respiratory       rate, EKG, adequacy of pulmonary  ventilation, and response to care.       Total physician intraservice time was 28 minutes. Recommendation:           - Repeat colonoscopy date to be determined after                            pending pathology results are reviewed for                            surveillance based on pathology results.                           - Return to GI office (date not yet determined). Procedure Code(s):        --- Professional ---                           [phone], Colonoscopy, flexible; with removal of                            tumor(s), polyp(s), or other lesion(s) by snare                            technique                           99152, Moderate sedation services provided by the                            same physician or other qualified health care                            professional performing the diagnostic or                            therapeutic service that the sedation supports,                            requiring the presence of an independent trained                            observer  to assist in the monitoring of the                            patient's level of consciousness and physiological                            status; initial 15 minutes of intraservice time,                            patient age 70 years or older                           [phone], Moderate sedation services; each additional                            15 minutes intraservice time Diagnosis Code(s):        --- Professional ---                           Z12.11, Encounter for screening for malignant                            neoplasm of colon                           D12.5, Benign neoplasm of sigmoid colon                           D12.2, Benign neoplasm of ascending colon                           K57.30, Diverticulosis of large intestine without                            perforation or abscess without bleeding CPT copyright 2016 American Medical Association. All rights reserved. The codes  documented in this report are preliminary and upon coder review may  be revised to meet current compliance requirements. Cristopher Estimable. Verenis Nicosia, MD Norvel Richards, MD 12/07/2016 9:57:19 AM This report has been signed electronically. Number of Addenda: 0

## 2016-12-07 NOTE — H&P (Signed)
@LOGO @   Primary Care Physician:  Sandi Mariscal, MD Primary Gastroenterologist:  Dr. Gala Romney  Pre-Procedure History & Physical: HPI:  Jeffrey Allen is a 60 y.o. male is here for a screening colonoscopy. Colonoscopy 10 years ago. No bowel symptoms. No family history of colon cancer.  Past Medical History:  Diagnosis Date  . GERD (gastroesophageal reflux disease)   . Gout   . Peptic ulcer disease     Past Surgical History:  Procedure Laterality Date  . COLONOSCOPY      Prior to Admission medications   Medication Sig Start Date End Date Taking? Authorizing Provider  allopurinol (ZYLOPRIM) 300 MG tablet Take 150 mg by mouth daily. 11/14/16  Yes Historical Provider, MD  cyclobenzaprine (FLEXERIL) 10 MG tablet Take 10 mg by mouth daily as needed for muscle spasms.  11/14/16  Yes Historical Provider, MD  omeprazole (PRILOSEC OTC) 20 MG tablet Take 20 mg by mouth daily.   Yes Historical Provider, MD  vitamin B-12 (CYANOCOBALAMIN) 1000 MCG tablet Take 1,000 mcg by mouth daily.   Yes Historical Provider, MD  colchicine 0.6 MG tablet Take 0.6 mg by mouth daily.    Historical Provider, MD  meloxicam (MOBIC) 15 MG tablet Take 15 mg by mouth daily as needed for pain.  11/14/16   Historical Provider, MD    Allergies as of 11/24/2016 - Review Complete 11/11/2016  Allergen Reaction Noted  . Prednisone Other (See Comments) 09/04/2015    Family History  Problem Relation Age of Onset  . Colon cancer Neg Hx     Social History   Social History  . Marital status: Married    Spouse name: N/A  . Number of children: N/A  . Years of education: N/A   Occupational History  . Not on file.   Social History Main Topics  . Smoking status: Former Smoker    Packs/day: 0.02    Years: 25.00    Types: Cigarettes    Quit date: 12/26/2006  . Smokeless tobacco: Never Used  . Alcohol use 8.4 oz/week    14 Cans of beer per week  . Drug use: No  . Sexual activity: Not on file   Other Topics Concern  .  Not on file   Social History Narrative  . No narrative on file    Review of Systems: See HPI, otherwise negative ROS  Physical Exam: BP (!) 155/94   Pulse 77   Temp 97.8 F (36.6 C) (Oral)   Resp 17   Ht 5\' 7"  (1.702 m)   Wt 195 lb (88.5 kg)   SpO2 96%   BMI 30.54 kg/m  General:   Alert,  Well-developed, well-nourished, pleasant and cooperative in NAD Head:  Normocephalic and atraumatic. Neck:  Supple; no masses or thyromegaly. Lungs:  Clear throughout to auscultation.   No wheezes, crackles, or rhonchi. No acute distress. Heart:  Regular rate and rhythm; no murmurs, clicks, rubs,  or gallops. Abdomen:  Soft, nontender and nondistended. No masses, hepatosplenomegaly or hernias noted. Normal bowel sounds, without guarding, and without rebound.    Impression/Plan: Jeffrey Allen is now here to undergo a screening colonoscopy.  Average risk screening examination.       Risks, benefits, limitations, imponderables and alternatives regarding colonoscopy have been reviewed with the patient. Questions have been answered. All parties agreeable.     Notice:  This dictation was prepared with Dragon dictation along with smaller phrase technology. Any transcriptional errors that result from this process are unintentional  and may not be corrected upon review.

## 2016-12-07 NOTE — Discharge Instructions (Signed)
°Colonoscopy °Discharge Instructions ° °Read the instructions outlined below and refer to this sheet in the next few weeks. These discharge instructions provide you with general information on caring for yourself after you leave the hospital. Your doctor may also give you specific instructions. While your treatment has been planned according to the most current medical practices available, unavoidable complications occasionally occur. If you have any problems or questions after discharge, call Dr. Rourk at [phone]. °ACTIVITY °· You may resume your regular activity, but move at a slower pace for the next 24 hours.  °· Take frequent rest periods for the next 24 hours.  °· Walking will help get rid of the air and reduce the bloated feeling in your belly (abdomen).  °· No driving for 24 hours (because of the medicine (anesthesia) used during the test).   °· Do not sign any important legal documents or operate any machinery for 24 hours (because of the anesthesia used during the test).  °NUTRITION °· Drink plenty of fluids.  °· You may resume your normal diet as instructed by your doctor.  °· Begin with a light meal and progress to your normal diet. Heavy or fried foods are harder to digest and may make you feel sick to your stomach (nauseated).  °· Avoid alcoholic beverages for 24 hours or as instructed.  °MEDICATIONS °· You may resume your normal medications unless your doctor tells you otherwise.  °WHAT YOU CAN EXPECT TODAY °· Some feelings of bloating in the abdomen.  °· Passage of more gas than usual.  °· Spotting of blood in your stool or on the toilet paper.  °IF YOU HAD POLYPS REMOVED DURING THE COLONOSCOPY: °· No aspirin products for 7 days or as instructed.  °· No alcohol for 7 days or as instructed.  °· Eat a soft diet for the next 24 hours.  °FINDING OUT THE RESULTS OF YOUR TEST °Not all test results are available during your visit. If your test results are not back during the visit, make an appointment  with your caregiver to find out the results. Do not assume everything is normal if you have not heard from your caregiver or the medical facility. It is important for you to follow up on all of your test results.  °SEEK IMMEDIATE MEDICAL ATTENTION IF: °· You have more than a spotting of blood in your stool.  °· Your belly is swollen (abdominal distention).  °· You are nauseated or vomiting.  °· You have a temperature over 101.  °· You have abdominal pain or discomfort that is severe or gets worse throughout the day.  ° ° °Colon polyp and diverticulosis information provided ° °Further recommendations to follow pending review of pathology report ° ° °Diverticulosis °Diverticulosis is the condition that develops when small pouches (diverticula) form in the wall of your colon. Your colon, or large intestine, is where water is absorbed and stool is formed. The pouches form when the inside layer of your colon pushes through weak spots in the outer layers of your colon. °CAUSES  °No one knows exactly what causes diverticulosis. °RISK FACTORS °· Being older than 50. Your risk for this condition increases with age. Diverticulosis is rare in people younger than 40 years. By age 80, almost everyone has it. °· Eating a low-fiber diet. °· Being frequently constipated. °· Being overweight. °· Not getting enough exercise. °· Smoking. °· Taking over-the-counter pain medicines, like aspirin and ibuprofen. °SYMPTOMS  °Most people with diverticulosis do not have symptoms. °DIAGNOSIS  °  Because diverticulosis often has no symptoms, health care providers often discover the condition during an exam for other colon problems. In many cases, a health care provider will diagnose diverticulosis while using a flexible scope to examine the colon (colonoscopy). °TREATMENT  °If you have never developed an infection related to diverticulosis, you may not need treatment. If you have had an infection before, treatment may include: °· Eating more  fruits, vegetables, and grains. °· Taking a fiber supplement. °· Taking a live bacteria supplement (probiotic). °· Taking medicine to relax your colon. °HOME CARE INSTRUCTIONS  °· Drink at least 6-8 glasses of water each day to prevent constipation. °· Try not to strain when you have a bowel movement. °· Keep all follow-up appointments. °If you have had an infection before:  °· Increase the fiber in your diet as directed by your health care provider or dietitian. °· Take a dietary fiber supplement if your health care provider approves. °· Only take medicines as directed by your health care provider. °SEEK MEDICAL CARE IF:  °· You have abdominal pain. °· You have bloating. °· You have cramps. °· You have not gone to the bathroom in 3 days. °SEEK IMMEDIATE MEDICAL CARE IF:  °· Your pain gets worse. °· Your bloating becomes very bad. °· You have a fever or chills, and your symptoms suddenly get worse. °· You begin vomiting. °· You have bowel movements that are bloody or black. °MAKE SURE YOU: °· Understand these instructions. °· Will watch your condition. °· Will get help right away if you are not doing well or get worse. °This information is not intended to replace advice given to you by your health care provider. Make sure you discuss any questions you have with your health care provider. °Document Released: 09/08/2004 Document Revised: 12/17/2013 Document Reviewed: 11/06/2013 °Elsevier Interactive Patient Education © 2017 Elsevier Inc. ° °Colon Polyps °Introduction °Polyps are tissue growths inside the body. Polyps can grow in many places, including the large intestine (colon). A polyp may be a round bump or a mushroom-shaped growth. You could have one polyp or several. °Most colon polyps are noncancerous (benign). However, some colon polyps can become cancerous over time. °What are the causes? °The exact cause of colon polyps is not known. °What increases the risk? °This condition is more likely to develop in  people who: °· Have a family history of colon cancer or colon polyps. °· Are older than 50 or older than 45 if they are African American. °· Have inflammatory bowel disease, such as ulcerative colitis or Crohn disease. °· Are overweight. °· Smoke cigarettes. °· Do not get enough exercise. °· Drink too much alcohol. °· Eat a diet that is: °¨ High in fat and red meat. °¨ Low in fiber. °· Had childhood cancer that was treated with abdominal radiation. °What are the signs or symptoms? °Most polyps do not cause symptoms. If you have symptoms, they may include: °· Blood coming from your rectum when having a bowel movement. °· Blood in your stool. The stool may look dark red or black. °· A change in bowel habits, such as constipation or diarrhea. °How is this diagnosed? °This condition is diagnosed with a colonoscopy. This is a procedure that uses a lighted, flexible scope to look at the inside of your colon. °How is this treated? °Treatment for this condition involves removing any polyps that are found. Those polyps will then be tested for cancer. If cancer is found, your health care provider will talk to you about   options for colon cancer treatment. °Follow these instructions at home: °Diet °· Eat plenty of fiber, such as fruits, vegetables, and whole grains. °· Eat foods that are high in calcium and vitamin D, such as milk, cheese, yogurt, eggs, liver, fish, and broccoli. °· Limit foods high in fat, red meats, and processed meats, such as hot dogs, sausage, bacon, and lunch meats. °· Maintain a healthy weight, or lose weight if recommended by your health care provider. °General instructions °· Do not smoke cigarettes. °· Do not drink alcohol excessively. °· Keep all follow-up visits as told by your health care provider. This is important. This includes keeping regularly scheduled colonoscopies. Talk to your health care provider about when you need a colonoscopy. °· Exercise every day or as told by your health care  provider. °Contact a health care provider if: °· You have new or worsening bleeding during a bowel movement. °· You have new or increased blood in your stool. °· You have a change in bowel habits. °· You unexpectedly lose weight. °This information is not intended to replace advice given to you by your health care provider. Make sure you discuss any questions you have with your health care provider. °Document Released: 09/07/2004 Document Revised: 05/19/2016 Document Reviewed: 11/02/2015 °© 2017 Elsevier ° °

## 2016-12-09 ENCOUNTER — Encounter: Payer: Self-pay | Admitting: Internal Medicine

## 2016-12-12 ENCOUNTER — Encounter (HOSPITAL_COMMUNITY): Payer: Self-pay | Admitting: Internal Medicine

## 2016-12-23 DIAGNOSIS — E559 Vitamin D deficiency, unspecified: Secondary | ICD-10-CM | POA: Diagnosis not present

## 2016-12-23 DIAGNOSIS — R7303 Prediabetes: Secondary | ICD-10-CM | POA: Diagnosis not present

## 2016-12-23 DIAGNOSIS — E7800 Pure hypercholesterolemia, unspecified: Secondary | ICD-10-CM | POA: Diagnosis not present

## 2016-12-23 DIAGNOSIS — J209 Acute bronchitis, unspecified: Secondary | ICD-10-CM | POA: Diagnosis not present

## 2017-02-23 DIAGNOSIS — D225 Melanocytic nevi of trunk: Secondary | ICD-10-CM | POA: Diagnosis not present

## 2017-02-23 DIAGNOSIS — X32XXXA Exposure to sunlight, initial encounter: Secondary | ICD-10-CM | POA: Diagnosis not present

## 2017-02-23 DIAGNOSIS — L918 Other hypertrophic disorders of the skin: Secondary | ICD-10-CM | POA: Diagnosis not present

## 2017-02-23 DIAGNOSIS — L821 Other seborrheic keratosis: Secondary | ICD-10-CM | POA: Diagnosis not present

## 2017-02-23 DIAGNOSIS — L570 Actinic keratosis: Secondary | ICD-10-CM | POA: Diagnosis not present

## 2017-02-24 DIAGNOSIS — M79642 Pain in left hand: Secondary | ICD-10-CM | POA: Diagnosis not present

## 2017-02-24 DIAGNOSIS — R7303 Prediabetes: Secondary | ICD-10-CM | POA: Diagnosis not present

## 2017-02-24 DIAGNOSIS — E7800 Pure hypercholesterolemia, unspecified: Secondary | ICD-10-CM | POA: Diagnosis not present

## 2017-02-24 DIAGNOSIS — E291 Testicular hypofunction: Secondary | ICD-10-CM | POA: Diagnosis not present

## 2017-02-24 DIAGNOSIS — M79641 Pain in right hand: Secondary | ICD-10-CM | POA: Diagnosis not present

## 2017-02-24 DIAGNOSIS — M109 Gout, unspecified: Secondary | ICD-10-CM | POA: Diagnosis not present

## 2017-02-24 DIAGNOSIS — E559 Vitamin D deficiency, unspecified: Secondary | ICD-10-CM | POA: Diagnosis not present

## 2017-04-18 DIAGNOSIS — E7800 Pure hypercholesterolemia, unspecified: Secondary | ICD-10-CM | POA: Diagnosis not present

## 2017-04-18 DIAGNOSIS — R7303 Prediabetes: Secondary | ICD-10-CM | POA: Diagnosis not present

## 2017-04-18 DIAGNOSIS — M25511 Pain in right shoulder: Secondary | ICD-10-CM | POA: Diagnosis not present

## 2017-04-18 DIAGNOSIS — E559 Vitamin D deficiency, unspecified: Secondary | ICD-10-CM | POA: Diagnosis not present

## 2017-05-18 DIAGNOSIS — M7581 Other shoulder lesions, right shoulder: Secondary | ICD-10-CM | POA: Diagnosis not present

## 2017-05-18 DIAGNOSIS — M7582 Other shoulder lesions, left shoulder: Secondary | ICD-10-CM | POA: Diagnosis not present

## 2017-05-18 DIAGNOSIS — S83242A Other tear of medial meniscus, current injury, left knee, initial encounter: Secondary | ICD-10-CM | POA: Diagnosis not present

## 2017-06-25 DIAGNOSIS — I639 Cerebral infarction, unspecified: Secondary | ICD-10-CM

## 2017-06-25 HISTORY — DX: Cerebral infarction, unspecified: I63.9

## 2017-07-10 DIAGNOSIS — M7582 Other shoulder lesions, left shoulder: Secondary | ICD-10-CM | POA: Diagnosis not present

## 2017-07-10 DIAGNOSIS — M7581 Other shoulder lesions, right shoulder: Secondary | ICD-10-CM | POA: Diagnosis not present

## 2017-07-19 DIAGNOSIS — M109 Gout, unspecified: Secondary | ICD-10-CM | POA: Diagnosis not present

## 2017-07-19 DIAGNOSIS — J22 Unspecified acute lower respiratory infection: Secondary | ICD-10-CM | POA: Diagnosis not present

## 2017-07-19 DIAGNOSIS — R05 Cough: Secondary | ICD-10-CM | POA: Diagnosis not present

## 2017-11-15 DIAGNOSIS — R0602 Shortness of breath: Secondary | ICD-10-CM | POA: Diagnosis not present

## 2017-11-15 DIAGNOSIS — J209 Acute bronchitis, unspecified: Secondary | ICD-10-CM | POA: Diagnosis not present

## 2017-11-15 DIAGNOSIS — R6889 Other general symptoms and signs: Secondary | ICD-10-CM | POA: Diagnosis not present

## 2017-11-15 DIAGNOSIS — R509 Fever, unspecified: Secondary | ICD-10-CM | POA: Diagnosis not present

## 2017-11-23 DIAGNOSIS — R05 Cough: Secondary | ICD-10-CM | POA: Diagnosis not present

## 2018-02-15 DIAGNOSIS — R0602 Shortness of breath: Secondary | ICD-10-CM | POA: Diagnosis not present

## 2018-02-15 DIAGNOSIS — R05 Cough: Secondary | ICD-10-CM | POA: Diagnosis not present

## 2018-02-15 DIAGNOSIS — J988 Other specified respiratory disorders: Secondary | ICD-10-CM | POA: Diagnosis not present

## 2018-02-15 DIAGNOSIS — R52 Pain, unspecified: Secondary | ICD-10-CM | POA: Diagnosis not present

## 2018-06-18 ENCOUNTER — Emergency Department (HOSPITAL_COMMUNITY): Payer: BLUE CROSS/BLUE SHIELD

## 2018-06-18 ENCOUNTER — Observation Stay (HOSPITAL_COMMUNITY)
Admission: EM | Admit: 2018-06-18 | Discharge: 2018-06-20 | Disposition: A | Discharge status: Home / Self Care | Payer: BLUE CROSS/BLUE SHIELD | Attending: Internal Medicine | Admitting: Internal Medicine

## 2018-06-18 ENCOUNTER — Other Ambulatory Visit: Payer: Self-pay

## 2018-06-18 ENCOUNTER — Encounter (HOSPITAL_COMMUNITY): Payer: Self-pay | Admitting: Emergency Medicine

## 2018-06-18 DIAGNOSIS — G459 Transient cerebral ischemic attack, unspecified: Secondary | ICD-10-CM | POA: Diagnosis not present

## 2018-06-18 DIAGNOSIS — Z79899 Other long term (current) drug therapy: Secondary | ICD-10-CM | POA: Insufficient documentation

## 2018-06-18 DIAGNOSIS — E785 Hyperlipidemia, unspecified: Secondary | ICD-10-CM | POA: Diagnosis not present

## 2018-06-18 DIAGNOSIS — I639 Cerebral infarction, unspecified: Secondary | ICD-10-CM | POA: Diagnosis not present

## 2018-06-18 DIAGNOSIS — M109 Gout, unspecified: Secondary | ICD-10-CM

## 2018-06-18 DIAGNOSIS — R4789 Other speech disturbances: Secondary | ICD-10-CM | POA: Diagnosis not present

## 2018-06-18 DIAGNOSIS — R4781 Slurred speech: Secondary | ICD-10-CM | POA: Diagnosis not present

## 2018-06-18 DIAGNOSIS — I10 Essential (primary) hypertension: Secondary | ICD-10-CM | POA: Diagnosis not present

## 2018-06-18 DIAGNOSIS — Z87891 Personal history of nicotine dependence: Secondary | ICD-10-CM | POA: Insufficient documentation

## 2018-06-18 DIAGNOSIS — R209 Unspecified disturbances of skin sensation: Secondary | ICD-10-CM | POA: Diagnosis present

## 2018-06-18 DIAGNOSIS — R4702 Dysphasia: Secondary | ICD-10-CM | POA: Diagnosis not present

## 2018-06-18 DIAGNOSIS — R4701 Aphasia: Secondary | ICD-10-CM | POA: Diagnosis not present

## 2018-06-18 DIAGNOSIS — K219 Gastro-esophageal reflux disease without esophagitis: Secondary | ICD-10-CM

## 2018-06-18 DIAGNOSIS — E782 Mixed hyperlipidemia: Secondary | ICD-10-CM

## 2018-06-18 DIAGNOSIS — I6789 Other cerebrovascular disease: Secondary | ICD-10-CM | POA: Diagnosis not present

## 2018-06-18 LAB — COMPREHENSIVE METABOLIC PANEL
ALBUMIN: 4.1 g/dL (ref 3.5–5.0)
ALK PHOS: 67 U/L (ref 38–126)
ALT: 20 U/L (ref 17–63)
AST: 18 U/L (ref 15–41)
Anion gap: 9 (ref 5–15)
BUN: 27 mg/dL — AB (ref 6–20)
CALCIUM: 9.4 mg/dL (ref 8.9–10.3)
CO2: 24 mmol/L (ref 22–32)
CREATININE: 1.03 mg/dL (ref 0.61–1.24)
Chloride: 107 mmol/L (ref 101–111)
GFR calc Af Amer: >60 mL/min (ref >60)
GFR calc non Af Amer: >60 mL/min (ref >60)
GLUCOSE: 94 mg/dL (ref 65–99)
Potassium: 4 mmol/L (ref 3.5–5.1)
SODIUM: 140 mmol/L (ref 135–145)
Total Bilirubin: 0.7 mg/dL (ref 0.3–1.2)
Total Protein: 7.2 g/dL (ref 6.5–8.1)

## 2018-06-18 LAB — PROTIME-INR
INR: 1.02
PROTHROMBIN TIME: 13.3 s (ref 11.4–15.2)

## 2018-06-18 LAB — I-STAT CHEM 8, ED
BUN: 27 mg/dL — AB (ref 6–20)
CALCIUM ION: 1.24 mmol/L (ref 1.15–1.40)
CREATININE: 1.1 mg/dL (ref 0.61–1.24)
Chloride: 106 mmol/L (ref 101–111)
Glucose, Bld: 92 mg/dL (ref 65–99)
HEMATOCRIT: 45 % (ref 39.0–52.0)
Hemoglobin: 15.3 g/dL (ref 13.0–17.0)
Potassium: 4 mmol/L (ref 3.5–5.1)
SODIUM: 142 mmol/L (ref 135–145)
TCO2: 24 mmol/L (ref 22–32)

## 2018-06-18 LAB — CBC
HCT: 46.2 % (ref 39.0–52.0)
Hemoglobin: 15.7 g/dL (ref 13.0–17.0)
MCH: 31.2 pg (ref 26.0–34.0)
MCHC: 34 g/dL (ref 30.0–36.0)
MCV: 91.7 fL (ref 78.0–100.0)
PLATELETS: 189 10*3/uL (ref 150–400)
RBC: 5.04 MIL/uL (ref 4.22–5.81)
RDW: 12.9 % (ref 11.5–15.5)
WBC: 8.9 10*3/uL (ref 4.0–10.5)

## 2018-06-18 LAB — URINALYSIS, ROUTINE W REFLEX MICROSCOPIC
BILIRUBIN URINE: NEGATIVE
Glucose, UA: NEGATIVE mg/dL
Hgb urine dipstick: NEGATIVE
KETONES UR: NEGATIVE mg/dL
Leukocytes, UA: NEGATIVE
NITRITE: NEGATIVE
PH: 6 (ref 5.0–8.0)
Protein, ur: NEGATIVE mg/dL
Specific Gravity, Urine: 1.03 (ref 1.005–1.030)

## 2018-06-18 LAB — RAPID URINE DRUG SCREEN, HOSP PERFORMED
AMPHETAMINES: NOT DETECTED
Benzodiazepines: NOT DETECTED
Cocaine: NOT DETECTED
OPIATES: NOT DETECTED
TETRAHYDROCANNABINOL: NOT DETECTED

## 2018-06-18 LAB — DIFFERENTIAL
Basophils Absolute: 0 10*3/uL (ref 0.0–0.1)
Basophils Relative: 0 %
Eosinophils Absolute: 0.3 10*3/uL (ref 0.0–0.7)
Eosinophils Relative: 3 %
LYMPHS PCT: 25 %
Lymphs Abs: 2.2 10*3/uL (ref 0.7–4.0)
Monocytes Absolute: 0.6 10*3/uL (ref 0.1–1.0)
Monocytes Relative: 7 %
NEUTROS PCT: 65 %
Neutro Abs: 5.8 10*3/uL (ref 1.7–7.7)

## 2018-06-18 LAB — CBG MONITORING, ED: Glucose-Capillary: 103 mg/dL — ABNORMAL HIGH (ref 65–99)

## 2018-06-18 LAB — I-STAT TROPONIN, ED: Troponin i, poc: 0.01 ng/mL (ref 0.00–0.08)

## 2018-06-18 LAB — ETHANOL: Alcohol, Ethyl (B): <10 mg/dL (ref <10)

## 2018-06-18 LAB — MAGNESIUM: Magnesium: 2.2 mg/dL (ref 1.7–2.4)

## 2018-06-18 LAB — APTT: aPTT: 27 seconds (ref 24–36)

## 2018-06-18 MED ORDER — COLCHICINE 0.6 MG PO TABS
0.6000 mg | ORAL_TABLET | Freq: Every day | ORAL | Status: DC
  Administered 2018-06-19 – 2018-06-20 (×2): 0.6 mg via ORAL
  Filled 2018-06-18 (×2): qty 1

## 2018-06-18 MED ORDER — IOPAMIDOL (ISOVUE-370) INJECTION 76%
100.0000 mL | Freq: Once | INTRAVENOUS | Status: AC | PRN
  Administered 2018-06-18: 100 mL via INTRAVENOUS

## 2018-06-18 MED ORDER — STROKE: EARLY STAGES OF RECOVERY BOOK
Freq: Once | Status: DC
  Filled 2018-06-18: qty 1

## 2018-06-18 MED ORDER — ASPIRIN EC 325 MG PO TBEC
325.0000 mg | DELAYED_RELEASE_TABLET | Freq: Once | ORAL | Status: AC
  Administered 2018-06-19: 325 mg via ORAL
  Filled 2018-06-18: qty 1

## 2018-06-18 MED ORDER — VITAMIN B-12 1000 MCG PO TABS
1000.0000 ug | ORAL_TABLET | Freq: Every day | ORAL | Status: DC
  Administered 2018-06-19 – 2018-06-20 (×2): 1000 ug via ORAL
  Filled 2018-06-18 (×2): qty 1

## 2018-06-18 MED ORDER — SODIUM CHLORIDE 0.9 % IV BOLUS
1000.0000 mL | Freq: Once | INTRAVENOUS | Status: AC
  Administered 2018-06-18: 1000 mL via INTRAVENOUS

## 2018-06-18 MED ORDER — ENOXAPARIN SODIUM 40 MG/0.4ML ~~LOC~~ SOLN
40.0000 mg | SUBCUTANEOUS | Status: DC
  Administered 2018-06-19 – 2018-06-20 (×2): 40 mg via SUBCUTANEOUS
  Filled 2018-06-18 (×2): qty 0.4

## 2018-06-18 MED ORDER — ACETAMINOPHEN 325 MG PO TABS: 650.0000 mg | ORAL_TABLET | Freq: Four times a day (QID) | ORAL | Status: DC | PRN

## 2018-06-18 MED ORDER — PANTOPRAZOLE SODIUM 40 MG PO TBEC
40.0000 mg | DELAYED_RELEASE_TABLET | Freq: Every day | ORAL | Status: DC
  Administered 2018-06-19 – 2018-06-20 (×2): 40 mg via ORAL
  Filled 2018-06-18 (×6): qty 1

## 2018-06-18 MED ORDER — ONDANSETRON HCL 4 MG PO TABS: 4.0000 mg | ORAL_TABLET | Freq: Four times a day (QID) | ORAL | Status: DC | PRN

## 2018-06-18 MED ORDER — DIPHENHYDRAMINE HCL 25 MG PO CAPS: 50.0000 mg | ORAL_CAPSULE | Freq: Every evening | ORAL | Status: DC | PRN

## 2018-06-18 MED ORDER — ONDANSETRON HCL 4 MG/2ML IJ SOLN: 4.0000 mg | Freq: Four times a day (QID) | INTRAMUSCULAR | Status: DC | PRN

## 2018-06-18 MED ORDER — ACETAMINOPHEN 650 MG RE SUPP: 650.0000 mg | Freq: Four times a day (QID) | RECTAL | Status: DC | PRN

## 2018-06-18 MED ORDER — ALLOPURINOL 150 MG HALF TABLET
150.0000 mg | ORAL_TABLET | Freq: Every day | ORAL | Status: DC
  Administered 2018-06-19 – 2018-06-20 (×2): 150 mg via ORAL
  Filled 2018-06-18 (×4): qty 1

## 2018-06-18 MED ORDER — ASPIRIN 300 MG RE SUPP: 300.0000 mg | Freq: Every day | RECTAL | Status: DC

## 2018-06-18 MED ORDER — LACTATED RINGERS IV SOLN
INTRAVENOUS | Status: AC
  Administered 2018-06-19: 03:00:00 via INTRAVENOUS

## 2018-06-18 MED ORDER — ATORVASTATIN CALCIUM 40 MG PO TABS
80.0000 mg | ORAL_TABLET | Freq: Every day | ORAL | Status: DC
  Administered 2018-06-19 (×2): 80 mg via ORAL
  Filled 2018-06-18 (×2): qty 2

## 2018-06-18 MED ORDER — ASPIRIN 325 MG PO TABS
325.0000 mg | ORAL_TABLET | Freq: Every day | ORAL | Status: DC
  Administered 2018-06-19 – 2018-06-20 (×2): 325 mg via ORAL
  Filled 2018-06-18 (×2): qty 1

## 2018-06-18 NOTE — ED Notes (Signed)
Symptoms are getting better since arrived. Pt now talking and no aphasia present.

## 2018-06-18 NOTE — ED Triage Notes (Signed)
Pt working on farm all day with little water. Bystanders states he began stumbling and they helped him to ground and pt started having slurred speech and drooling. Pt alert, unable to talk but follows commands. Does try to talk. Has expressive aphasia present. edp at bedside.

## 2018-06-18 NOTE — ED Notes (Signed)
Returned from ct with pt. edp present.

## 2018-06-18 NOTE — Consult Note (Signed)
   [facility] Consult Services  Impression:  Probable heat stroke due to heat exposure/dehydration.  Cannot r/o TIA.  Currently no neurological deficits.      Recommendations:  =>  Would not consider IV tPA in this case as symptoms seem to have resolved.  However, would order STAT CTA head/neck to rule out large vessel disease/LVO. =>  If negative would recommend admission for observation with plan for Brain MRI.   =>  Hydration.  Allow mild permissive HTN for now.  Give ASA.    Please call back if CTA is abnormal.  Discussed with ED physician.   Verdis Prime, MD [facility]  ---------------------------------------------------------------------  History: 62 yo man with h/o HTN without h/o stroke who was working in a barn today in very hot conditions and had not had much to drink.  Around 17:15 he started to feel lightheaded, weak, nauseous, and felt like he had trouble speaking.  Bystanders seemed to describe that he was unable to get words out.  He also lowered himself to the ground because it felt like he might pass out.  No LOC.  No facial droop or lateralized weakness.  Speech difficulty resolved.  Currently still feels weak, nauseous, but less dizzy.    Diagnostic Testing: Labs:   Reviewed.  Unremarkable except for elevated BUN. Imaging:  Head CT no acute pathology  Vital Signs:   P 77, BP 166/114.  Exam:  NIHSS 0  Mental Status:  Alert, oriented x 4. Slightly lethargic, feels poorly. Fluent speech, intact naming, intact reading, intact repetition, intact comprehension.  Cranial Nerves:  Pupils: Equal round and reactive to light Extraocular movements: Intact in all cardinal gaze No visual field cut Facial sensation: Intact to light touch Facial movements: Intact and symmetric, although questionable flattened L nasolabial fold. Tongue midline No dysarthria  Motor Exam:  No drift x 4.  Fine finger movements normal and  equal.  Tremor/Abnormal Movements:  No tremors, myoclonus.  Sensory Exam:   Intact to touch in all extremities.  Coordination:   No ataxia to finger/nose or heel/shin.  Medical Decision Making:  - Extensive number of diagnosis or management options are considered above.   - Extensive amount of complex data reviewed.   - High risk of complication and/or morbidity or mortality are associated with differential diagnostic considerations above.  - There may be uncertain outcome and increased probability of prolonged functional impairment or high probability of severe prolonged functional impairment associated with some of these differential diagnosis.   Medical Data Reviewed:  1.Data reviewed include clinical labs, radiology,  Medical Tests;   2.Tests results discussed w/performing or interpreting physician;   3.Obtaining/reviewing old medical records;  4.Obtaining case history from another source;  5.Independent review of image, tracing or specimen.   Patient was informed the Neurology Consult would happen via telehealth (remote video) and consented to receiving care in this manner.

## 2018-06-18 NOTE — H&P (Addendum)
History and Physical    BENSYN BORNEMANN NAT:[id number] DOB: 1956/12/09 DOA: 06/18/2018  PCP: Sandi Mariscal, MD   Patient coming from: Home  Chief Complaint: Aphasia  HPI: Jeffrey Allen is a 62 y.o. male with medical history significant for GERD with peptic ulcer disease, and gout who arrived via EMS after working in his barn and tractor outside in the heat all day.  He apparently was showing some people his trailer and it was quite hot inside and he began to feel dizzy and lightheaded.  More worrisome, he had numbness to the left side of his face and trouble forming words to the point where he became completely aphasic.  He was covered with cool water and some ice and brought via EMS to the ED.  After approximately an hour, he began to regain control of his speech but he is still quite slurred.  He states that he feels weak all over but denies any focal numbness or weakness to any of his extremities.  He denies any headache, visual change, nausea, or vomiting.  He states that he has been trying to eat and drink frequently due to the heat.   ED Course: Vital signs are stable along with lab work.  Urine analysis negative for any acute abnormalities.  He has been seen by tele-neurology and CT of the head as well as CT angiogram of head and neck were performed with no acute findings.  EKG with some sinus rhythm and ventricular trigeminy.  He has thus far received a bolus of IV fluid and states that he is feeling much better.  Recommendations are for MRI in a.m. and further CVA rule out.  Review of Systems: All others reviewed and otherwise negative.  Past Medical History:  Diagnosis Date  . GERD (gastroesophageal reflux disease)   . Gout   . Peptic ulcer disease     Past Surgical History:  Procedure Laterality Date  . COLONOSCOPY    . COLONOSCOPY N/A 12/07/2016   Procedure: COLONOSCOPY;  Surgeon: Daneil Dolin, MD;  Location: [facility];  Service: Endoscopy;  Laterality: N/A;  9:15 AM     reports that he quit smoking about 11 years ago. His smoking use included cigarettes. He has a 0.50 pack-year smoking history. He has never used smokeless tobacco. He reports that he drinks about 8.4 oz of alcohol per week. He reports that he does not use drugs.  Allergies  Allergen Reactions  . Prednisone Other (See Comments)    Peptic ulcers      Family History  Problem Relation Age of Onset  . Colon cancer Neg Hx     Prior to Admission medications   Medication Sig Start Date End Date Taking? Authorizing Provider  allopurinol (ZYLOPRIM) 300 MG tablet Take 150 mg by mouth daily. 11/14/16   [provider]  colchicine 0.6 MG tablet Take 0.6 mg by mouth daily.    [provider]  cyclobenzaprine (FLEXERIL) 10 MG tablet Take 10 mg by mouth daily as needed for muscle spasms.  11/14/16   [provider]  meloxicam (MOBIC) 15 MG tablet Take 15 mg by mouth daily as needed for pain.  11/14/16   [provider]  omeprazole (PRILOSEC OTC) 20 MG tablet Take 20 mg by mouth daily.    [provider]  vitamin B-12 (CYANOCOBALAMIN) 1000 MCG tablet Take 1,000 mcg by mouth daily.    [provider]    Physical Exam: Vitals:   06/18/18 2130  06/18/18 2200 06/18/18 2215 06/18/18 2230  BP: (!) 131/94 130/67 (!) 147/99 (!) 157/95  Pulse: (!) 59 71 66 68  Resp: 17 20 (!) 28 (!) 28  Temp:      TempSrc:      SpO2: 97% 97% 95% 99%    Constitutional: NAD, calm, comfortable Vitals:   06/18/18 2130 06/18/18 2200 06/18/18 2215 06/18/18 2230  BP: (!) 131/94 130/67 (!) 147/99 (!) 157/95  Pulse: (!) 59 71 66 68  Resp: 17 20 (!) 28 (!) 28  Temp:      TempSrc:      SpO2: 97% 97% 95% 99%   Eyes: lids and conjunctivae normal ENMT: Mucous membranes are moist.  Neck: normal, supple Respiratory: clear to auscultation bilaterally. Normal respiratory effort. No accessory muscle use.  Cardiovascular: Regular rate and rhythm, no murmurs. No extremity  edema. Abdomen: no tenderness, no distention. Bowel sounds positive.  Musculoskeletal:  No joint deformity upper and lower extremities.   Neuro: No weakness or sensory deficit on gross examination of all 4 extremities.  Cranial nerves II through XII appear to be grossly intact.  Patient does have some left lower quadrant facial numbness that is slight. Skin: no rashes, lesions, ulcers.  Psychiatric: Normal judgment and insight. Alert and oriented x 3. Normal mood.   Labs on Admission: I have personally reviewed following labs and imaging studies  CBC: Recent Labs  Lab 06/18/18 1822 06/18/18 1830  WBC 8.9  --   NEUTROABS 5.8  --   HGB 15.7 15.3  HCT 46.2 45.0  MCV 91.7  --   PLT 189  --    Basic Metabolic Panel: Recent Labs  Lab 06/18/18 1822 06/18/18 1830  NA 140 142  K 4.0 4.0  CL 107 106  CO2 24  --   GLUCOSE 94 92  BUN 27* 27*  CREATININE 1.03 1.10  CALCIUM 9.4  --   MG 2.2  --    GFR: CrCl cannot be calculated (Unknown ideal weight.). Liver Function Tests: Recent Labs  Lab 06/18/18 1822  AST 18  ALT 20  ALKPHOS 67  BILITOT 0.7  PROT 7.2  ALBUMIN 4.1   No results for input(s): LIPASE, AMYLASE in the last 168 hours. No results for input(s): AMMONIA in the last 168 hours. Coagulation Profile: Recent Labs  Lab 06/18/18 1822  INR 1.02   Cardiac Enzymes: No results for input(s): CKTOTAL, CKMB, CKMBINDEX, TROPONINI in the last 168 hours. BNP (last 3 results) No results for input(s): PROBNP in the last 8760 hours. HbA1C: No results for input(s): HGBA1C in the last 72 hours. CBG: Recent Labs  Lab 06/18/18 1817  GLUCAP 103*   Lipid Profile: No results for input(s): CHOL, HDL, LDLCALC, TRIG, CHOLHDL, LDLDIRECT in the last 72 hours. Thyroid Function Tests: No results for input(s): TSH, T4TOTAL, FREET4, T3FREE, THYROIDAB in the last 72 hours. Anemia Panel: No results for input(s): VITAMINB12, FOLATE, FERRITIN, TIBC, IRON, RETICCTPCT in the last 72  hours. Urine analysis:    Component Value Date/Time   COLORURINE COLORLESS (A) 06/18/2018 1822   APPEARANCEUR CLEAR 06/18/2018 1822   LABSPEC 1.030 06/18/2018 1822   PHURINE 6.0 06/18/2018 1822   GLUCOSEU NEGATIVE 06/18/2018 1822   HGBUR NEGATIVE 06/18/2018 1822   BILIRUBINUR NEGATIVE 06/18/2018 1822   KETONESUR NEGATIVE 06/18/2018 1822   PROTEINUR NEGATIVE 06/18/2018 1822   UROBILINOGEN 0.2 09/04/2015 2156   NITRITE NEGATIVE 06/18/2018 1822   LEUKOCYTESUR NEGATIVE 06/18/2018 1822    Radiological Exams on Admission: Ct  Angio Head W Or Wo Contrast  Result Date: 06/18/2018 CLINICAL DATA:  Speech difficulty.  Weakness. EXAM: CT ANGIOGRAPHY HEAD AND NECK TECHNIQUE: Multidetector CT imaging of the head and neck was performed using the standard protocol during bolus administration of intravenous contrast. Multiplanar CT image reconstructions and MIPs were obtained to evaluate the vascular anatomy. Carotid stenosis measurements (when applicable) are obtained utilizing NASCET criteria, using the distal internal carotid diameter as the denominator. CONTRAST:  12mL ISOVUE-370 IOPAMIDOL (ISOVUE-370) INJECTION 76% COMPARISON:  Head CT 06/18/2018 FINDINGS: CTA NECK FINDINGS AORTIC ARCH: There is no calcific atherosclerosis of the aortic arch. There is no aneurysm, dissection or hemodynamically significant stenosis of the visualized ascending aorta and aortic arch. Conventional 3 vessel aortic branching pattern. The visualized proximal subclavian arteries are widely patent. RIGHT CAROTID SYSTEM: --Common carotid artery: Widely patent origin without common carotid artery dissection or aneurysm. --Internal carotid artery: No dissection, occlusion or aneurysm. No hemodynamically significant stenosis. --External carotid artery: No acute abnormality. LEFT CAROTID SYSTEM: --Common carotid artery: Widely patent origin without common carotid artery dissection or aneurysm. --Internal carotid artery:No dissection,  occlusion or aneurysm. No hemodynamically significant stenosis. --External carotid artery: No acute abnormality. VERTEBRAL ARTERIES: Left dominant configuration. Both origins are normal. No dissection, occlusion or flow-limiting stenosis to the vertebrobasilar confluence. SKELETON: There is no bony spinal canal stenosis. No lytic or blastic lesion. OTHER NECK: Normal pharynx, larynx and major salivary glands. No cervical lymphadenopathy. Unremarkable thyroid gland. UPPER CHEST: No pneumothorax or pleural effusion. No nodules or masses. CTA HEAD FINDINGS ANTERIOR CIRCULATION: --Intracranial internal carotid arteries: Normal. --Anterior cerebral arteries: Normal. Both A1 segments are present. Patent anterior communicating artery. --Middle cerebral arteries: Normal. --Posterior communicating arteries: Present bilaterally. POSTERIOR CIRCULATION: --Basilar artery: Normal. --Posterior cerebral arteries: Normal. --Superior cerebellar arteries: Normal. --Inferior cerebellar arteries: Normal anterior and posterior inferior cerebellar arteries. VENOUS SINUSES: As permitted by contrast timing, patent. ANATOMIC VARIANTS: Fetal origin of the right PCA. DELAYED PHASE: No parenchymal contrast enhancement. Review of the MIP images confirms the above findings. IMPRESSION: No emergent large vessel occlusion or hemodynamically significant stenosis of the arteries of the head and neck. Electronically Signed   By: Ulyses Jarred M.D.   On: 06/18/2018 20:20   Ct Head Wo Contrast  Result Date: 06/18/2018 CLINICAL DATA:  62 y/o  M; expressive aphasia. EXAM: CT HEAD WITHOUT CONTRAST TECHNIQUE: Contiguous axial images were obtained from the base of the skull through the vertex without intravenous contrast. COMPARISON:  None. FINDINGS: Brain: No evidence of acute infarction, hemorrhage, hydrocephalus, extra-axial collection or mass lesion/mass effect. Vascular: No hyperdense vessel or unexpected calcification. Skull: Normal. Negative for  fracture or focal lesion. Sinuses/Orbits: No acute finding. Other: None. IMPRESSION: No acute intracranial abnormality identified. Unremarkable CT of the head for age. Electronically Signed   By: Kristine Garbe M.D.   On: 06/18/2018 18:57   Ct Angio Neck W And/or Wo Contrast  Result Date: 06/18/2018 CLINICAL DATA:  Speech difficulty.  Weakness. EXAM: CT ANGIOGRAPHY HEAD AND NECK TECHNIQUE: Multidetector CT imaging of the head and neck was performed using the standard protocol during bolus administration of intravenous contrast. Multiplanar CT image reconstructions and MIPs were obtained to evaluate the vascular anatomy. Carotid stenosis measurements (when applicable) are obtained utilizing NASCET criteria, using the distal internal carotid diameter as the denominator. CONTRAST:  127mL ISOVUE-370 IOPAMIDOL (ISOVUE-370) INJECTION 76% COMPARISON:  Head CT 06/18/2018 FINDINGS: CTA NECK FINDINGS AORTIC ARCH: There is no calcific atherosclerosis of the aortic arch. There is no aneurysm, dissection  or hemodynamically significant stenosis of the visualized ascending aorta and aortic arch. Conventional 3 vessel aortic branching pattern. The visualized proximal subclavian arteries are widely patent. RIGHT CAROTID SYSTEM: --Common carotid artery: Widely patent origin without common carotid artery dissection or aneurysm. --Internal carotid artery: No dissection, occlusion or aneurysm. No hemodynamically significant stenosis. --External carotid artery: No acute abnormality. LEFT CAROTID SYSTEM: --Common carotid artery: Widely patent origin without common carotid artery dissection or aneurysm. --Internal carotid artery:No dissection, occlusion or aneurysm. No hemodynamically significant stenosis. --External carotid artery: No acute abnormality. VERTEBRAL ARTERIES: Left dominant configuration. Both origins are normal. No dissection, occlusion or flow-limiting stenosis to the vertebrobasilar confluence. SKELETON:  There is no bony spinal canal stenosis. No lytic or blastic lesion. OTHER NECK: Normal pharynx, larynx and major salivary glands. No cervical lymphadenopathy. Unremarkable thyroid gland. UPPER CHEST: No pneumothorax or pleural effusion. No nodules or masses. CTA HEAD FINDINGS ANTERIOR CIRCULATION: --Intracranial internal carotid arteries: Normal. --Anterior cerebral arteries: Normal. Both A1 segments are present. Patent anterior communicating artery. --Middle cerebral arteries: Normal. --Posterior communicating arteries: Present bilaterally. POSTERIOR CIRCULATION: --Basilar artery: Normal. --Posterior cerebral arteries: Normal. --Superior cerebellar arteries: Normal. --Inferior cerebellar arteries: Normal anterior and posterior inferior cerebellar arteries. VENOUS SINUSES: As permitted by contrast timing, patent. ANATOMIC VARIANTS: Fetal origin of the right PCA. DELAYED PHASE: No parenchymal contrast enhancement. Review of the MIP images confirms the above findings. IMPRESSION: No emergent large vessel occlusion or hemodynamically significant stenosis of the arteries of the head and neck. Electronically Signed   By: Ulyses Jarred M.D.   On: 06/18/2018 20:20    EKG: Independently reviewed. SR 75bpm. Ventricular trigeminy.  Assessment/Plan Principal Problem:   TIA (transient ischemic attack) Active Problems:   Gout   GERD (gastroesophageal reflux disease)    1. Transient aphasia suspect secondary to heat stroke versus TIA.  Maintain on full dose aspirin as well as statin for now and check lipids and A1c levels.  Brain MRI in a.m. for further evaluation. 2D echo. No LVO after CT angiogram.  PT/OT evaluation.  Maintain on IV fluid. 2. Chronic gout.  Maintain on home allopurinol and colchicine. 3. GERD with history of PUD.  Maintain on PPI.   DVT prophylaxis: Lovenox Code Status: Full Family Communication: 2 sons at bedside Disposition Plan:R/O CVA with MRI in am along with PT/OT evaluation Consults  called:None Admission status: Obs, tele   Obaloluwa Delatte Darleen Crocker DO [facility] Pager [phone]  If 7PM-7AM, please contact night-coverage [email or web address] Password [facility]  06/18/2018, 10:45 PM

## 2018-06-18 NOTE — ED Provider Notes (Signed)
[facility] EMERGENCY DEPARTMENT Provider Note   CSN: [id number] Arrival date & time: 06/18/18  1812     History   Chief Complaint Chief Complaint  Patient presents with  . Code Stroke    HPI Jeffrey Allen is a 62 y.o. male.  Patient arrives by EMS for possible code stroke.  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  He was feeling dizzy lightheaded and then he began stumbling.   the people that with him helped him to the ground and he was noted to have some difficulty speaking and slurred speech.  EMS found him to have difficult to understand speech but he would not appropriately and seemed to comprehend everything.  He had no focal weakness on either side.  He presents to the ED about an hour after symptoms began with improving speech.  Currently complains of feeling weak all over and a little bit of difficulty getting his words.  He denies any headache double vision blurry vision chest pain shortness of breath nausea vomiting diarrhea.  The history is provided by the patient and the EMS personnel.  Cerebrovascular Accident  This is a new problem. The current episode started less than 1 hour ago. The problem has been rapidly improving. Pertinent negatives include no chest pain, no abdominal pain, no headaches and no shortness of breath. Nothing aggravates the symptoms. Nothing relieves the symptoms. He has tried nothing for the symptoms. The treatment provided moderate relief.    Past Medical History:  Diagnosis Date  . GERD (gastroesophageal reflux disease)   . Gout   . Peptic ulcer disease     Patient Active Problem List   Diagnosis Date Noted  . CARPAL TUNNEL SYNDROME 04/06/2009  . CUBITAL TUNNEL SYNDROME 04/06/2009  . NECK PAIN, CHRONIC 04/06/2009  . Disturbance of skin sensation 04/06/2009  . ANKLE SPRAIN 12/03/2008    Past Surgical History:  Procedure Laterality Date  .  COLONOSCOPY    . COLONOSCOPY N/A 12/07/2016   Procedure: COLONOSCOPY;  Surgeon: Daneil Dolin, MD;  Location: [facility];  Service: Endoscopy;  Laterality: N/A;  9:15 AM        Home Medications    Prior to Admission medications   Medication Sig Start Date End Date Taking? Authorizing Provider  allopurinol (ZYLOPRIM) 300 MG tablet Take 150 mg by mouth daily. 11/14/16   [provider]  colchicine 0.6 MG tablet Take 0.6 mg by mouth daily.    [provider]  cyclobenzaprine (FLEXERIL) 10 MG tablet Take 10 mg by mouth daily as needed for muscle spasms.  11/14/16   [provider]  meloxicam (MOBIC) 15 MG tablet Take 15 mg by mouth daily as needed for pain.  11/14/16   [provider]  omeprazole (PRILOSEC OTC) 20 MG tablet Take 20 mg by mouth daily.    [provider]  vitamin B-12 (CYANOCOBALAMIN) 1000 MCG tablet Take 1,000 mcg by mouth daily.    [provider]    Family History Family History  Problem Relation Age of Onset  . Colon cancer Neg Hx     Social History Social History   Tobacco Use  . Smoking status: Former Smoker    Packs/day: 0.02    Years: 25.00    Pack years: 0.50    Types: Cigarettes    Last attempt to quit: 12/26/2006    Years  since quitting: 11.4  . Smokeless tobacco: Never Used  Substance Use Topics  . Alcohol use: Yes    Alcohol/week: 8.4 oz    Types: 14 Cans of beer per week  . Drug use: No     Allergies   Prednisone   Review of Systems Review of Systems  Constitutional: Positive for fatigue. Negative for chills and fever.  HENT: Positive for drooling. Negative for rhinorrhea and sore throat.   Eyes: Negative for pain and visual disturbance.  Respiratory: Negative for cough and shortness of breath.   Cardiovascular: Negative for chest pain.  Gastrointestinal: Negative for abdominal pain, nausea and vomiting.  Genitourinary: Negative for dysuria and frequency.  Musculoskeletal:  Negative for back pain and neck pain.  Skin: Negative for rash and wound.  Neurological: Positive for dizziness, speech difficulty, weakness and light-headedness. Negative for headaches.     Physical Exam Updated Vital Signs BP (!) 166/114 (BP Location: Left Arm)   Pulse 77   Temp 98.4 F (36.9 C) (Oral)   Resp (!) 23   SpO2 97%   Physical Exam  Constitutional: He is oriented to person, place, and time. He appears well-developed and well-nourished.  HENT:  Head: Normocephalic and atraumatic.  Eyes: Conjunctivae are normal.  Neck: Neck supple.  Cardiovascular: Normal rate and regular rhythm.  No murmur heard. Pulmonary/Chest: Effort normal and breath sounds normal. No respiratory distress.  Abdominal: Soft. There is no tenderness.  Musculoskeletal: Normal range of motion. He exhibits no edema, tenderness or deformity.  Neurological: He is alert and oriented to person, place, and time. He has normal strength. No cranial nerve deficit or sensory deficit. He displays a negative Romberg sign. Coordination normal. GCS eye subscore is 4. GCS verbal subscore is 5. GCS motor subscore is 6.  Skin: Skin is warm and dry.  Psychiatric: He has a normal mood and affect.  Nursing note and vitals reviewed.    ED Treatments / Results  Labs (all labs ordered are listed, but only abnormal results are displayed) Labs Reviewed  COMPREHENSIVE METABOLIC PANEL - Abnormal; Notable for the following components:      Result Value   BUN 27 (*)    All other components within normal limits  RAPID URINE DRUG SCREEN, HOSP PERFORMED - Abnormal; Notable for the following components:   Barbiturates   (*)    Value: Result not available. Reagent lot number recalled by manufacturer.   All other components within normal limits  URINALYSIS, ROUTINE W REFLEX MICROSCOPIC - Abnormal; Notable for the following components:   Color, Urine COLORLESS (*)    All other components within normal limits  HEMOGLOBIN A1C -  Abnormal; Notable for the following components:   Hgb A1c MFr Bld 5.8 (*)    All other components within normal limits  LIPID PANEL - Abnormal; Notable for the following components:   Cholesterol 226 (*)    Triglycerides 162 (*)    HDL 36 (*)    LDL Cholesterol 158 (*)    All other components within normal limits  BASIC METABOLIC PANEL - Abnormal; Notable for the following components:   BUN 22 (*)    All other components within normal limits  CBG MONITORING, ED - Abnormal; Notable for the following components:   Glucose-Capillary 103 (*)    All other components within normal limits  I-STAT CHEM 8, ED - Abnormal; Notable for the following components:   BUN 27 (*)    All other components within normal limits  ETHANOL  PROTIME-INR  APTT  CBC  DIFFERENTIAL  MAGNESIUM  HIV ANTIBODY (ROUTINE TESTING)  CBC  I-STAT TROPONIN, ED    EKG EKG Interpretation  Date/Time:  Monday June 18 2018 18:15:56 EDT Ventricular Rate:  75 PR Interval:    QRS Duration: 107 QT Interval:  412 QTC Calculation: 461 R Axis:   164 Text Interpretation:  Sinus rhythm Ventricular trigeminy Borderline prolonged PR interval Inferior infarct, old Baseline wander in lead(s) V2 no prior to compare with Confirmed by Aletta Edouard [phone]) on 06/18/2018 6:26:48 PM   Radiology Ct Angio Head W Or Wo Contrast  Result Date: 06/18/2018 CLINICAL DATA:  Speech difficulty.  Weakness. EXAM: CT ANGIOGRAPHY HEAD AND NECK TECHNIQUE: Multidetector CT imaging of the head and neck was performed using the standard protocol during bolus administration of intravenous contrast. Multiplanar CT image reconstructions and MIPs were obtained to evaluate the vascular anatomy. Carotid stenosis measurements (when applicable) are obtained utilizing NASCET criteria, using the distal internal carotid diameter as the denominator. CONTRAST:  129mL ISOVUE-370 IOPAMIDOL (ISOVUE-370) INJECTION 76% COMPARISON:  Head CT 06/18/2018 FINDINGS: CTA NECK  FINDINGS AORTIC ARCH: There is no calcific atherosclerosis of the aortic arch. There is no aneurysm, dissection or hemodynamically significant stenosis of the visualized ascending aorta and aortic arch. Conventional 3 vessel aortic branching pattern. The visualized proximal subclavian arteries are widely patent. RIGHT CAROTID SYSTEM: --Common carotid artery: Widely patent origin without common carotid artery dissection or aneurysm. --Internal carotid artery: No dissection, occlusion or aneurysm. No hemodynamically significant stenosis. --External carotid artery: No acute abnormality. LEFT CAROTID SYSTEM: --Common carotid artery: Widely patent origin without common carotid artery dissection or aneurysm. --Internal carotid artery:No dissection, occlusion or aneurysm. No hemodynamically significant stenosis. --External carotid artery: No acute abnormality. VERTEBRAL ARTERIES: Left dominant configuration. Both origins are normal. No dissection, occlusion or flow-limiting stenosis to the vertebrobasilar confluence. SKELETON: There is no bony spinal canal stenosis. No lytic or blastic lesion. OTHER NECK: Normal pharynx, larynx and major salivary glands. No cervical lymphadenopathy. Unremarkable thyroid gland. UPPER CHEST: No pneumothorax or pleural effusion. No nodules or masses. CTA HEAD FINDINGS ANTERIOR CIRCULATION: --Intracranial internal carotid arteries: Normal. --Anterior cerebral arteries: Normal. Both A1 segments are present. Patent anterior communicating artery. --Middle cerebral arteries: Normal. --Posterior communicating arteries: Present bilaterally. POSTERIOR CIRCULATION: --Basilar artery: Normal. --Posterior cerebral arteries: Normal. --Superior cerebellar arteries: Normal. --Inferior cerebellar arteries: Normal anterior and posterior inferior cerebellar arteries. VENOUS SINUSES: As permitted by contrast timing, patent. ANATOMIC VARIANTS: Fetal origin of the right PCA. DELAYED PHASE: No parenchymal contrast  enhancement. Review of the MIP images confirms the above findings. IMPRESSION: No emergent large vessel occlusion or hemodynamically significant stenosis of the arteries of the head and neck. Electronically Signed   By: Ulyses Jarred M.D.   On: 06/18/2018 20:20   Ct Head Wo Contrast  Result Date: 06/18/2018 CLINICAL DATA:  62 y/o  M; expressive aphasia. EXAM: CT HEAD WITHOUT CONTRAST TECHNIQUE: Contiguous axial images were obtained from the base of the skull through the vertex without intravenous contrast. COMPARISON:  None. FINDINGS: Brain: No evidence of acute infarction, hemorrhage, hydrocephalus, extra-axial collection or mass lesion/mass effect. Vascular: No hyperdense vessel or unexpected calcification. Skull: Normal. Negative for fracture or focal lesion. Sinuses/Orbits: No acute finding. Other: None. IMPRESSION: No acute intracranial abnormality identified. Unremarkable CT of the head for age. Electronically Signed   By: Kristine Garbe M.D.   On: 06/18/2018 18:57   Ct Angio Neck W And/or Wo Contrast  Result Date: 06/18/2018 CLINICAL DATA:  Speech difficulty.  Weakness. EXAM: CT ANGIOGRAPHY HEAD AND NECK TECHNIQUE: Multidetector CT imaging of the head and neck was performed using the standard protocol during bolus administration of intravenous contrast. Multiplanar CT image reconstructions and MIPs were obtained to evaluate the vascular anatomy. Carotid stenosis measurements (when applicable) are obtained utilizing NASCET criteria, using the distal internal carotid diameter as the denominator. CONTRAST:  170mL ISOVUE-370 IOPAMIDOL (ISOVUE-370) INJECTION 76% COMPARISON:  Head CT 06/18/2018 FINDINGS: CTA NECK FINDINGS AORTIC ARCH: There is no calcific atherosclerosis of the aortic arch. There is no aneurysm, dissection or hemodynamically significant stenosis of the visualized ascending aorta and aortic arch. Conventional 3 vessel aortic branching pattern. The visualized proximal subclavian  arteries are widely patent. RIGHT CAROTID SYSTEM: --Common carotid artery: Widely patent origin without common carotid artery dissection or aneurysm. --Internal carotid artery: No dissection, occlusion or aneurysm. No hemodynamically significant stenosis. --External carotid artery: No acute abnormality. LEFT CAROTID SYSTEM: --Common carotid artery: Widely patent origin without common carotid artery dissection or aneurysm. --Internal carotid artery:No dissection, occlusion or aneurysm. No hemodynamically significant stenosis. --External carotid artery: No acute abnormality. VERTEBRAL ARTERIES: Left dominant configuration. Both origins are normal. No dissection, occlusion or flow-limiting stenosis to the vertebrobasilar confluence. SKELETON: There is no bony spinal canal stenosis. No lytic or blastic lesion. OTHER NECK: Normal pharynx, larynx and major salivary glands. No cervical lymphadenopathy. Unremarkable thyroid gland. UPPER CHEST: No pneumothorax or pleural effusion. No nodules or masses. CTA HEAD FINDINGS ANTERIOR CIRCULATION: --Intracranial internal carotid arteries: Normal. --Anterior cerebral arteries: Normal. Both A1 segments are present. Patent anterior communicating artery. --Middle cerebral arteries: Normal. --Posterior communicating arteries: Present bilaterally. POSTERIOR CIRCULATION: --Basilar artery: Normal. --Posterior cerebral arteries: Normal. --Superior cerebellar arteries: Normal. --Inferior cerebellar arteries: Normal anterior and posterior inferior cerebellar arteries. VENOUS SINUSES: As permitted by contrast timing, patent. ANATOMIC VARIANTS: Fetal origin of the right PCA. DELAYED PHASE: No parenchymal contrast enhancement. Review of the MIP images confirms the above findings. IMPRESSION: No emergent large vessel occlusion or hemodynamically significant stenosis of the arteries of the head and neck. Electronically Signed   By: Ulyses Jarred M.D.   On: 06/18/2018 20:20     Procedures .Critical Care Performed by: Hayden Rasmussen, MD Authorized by: Hayden Rasmussen, MD   Critical care provider statement:    Critical care time (minutes):  30   Critical care time was exclusive of:  Separately billable procedures and treating other patients   Critical care was necessary to treat or prevent imminent or life-threatening deterioration of the following conditions:  CNS failure or compromise   Critical care was time spent personally by me on the following activities:  Development of treatment plan with patient or surrogate, discussions with consultants, evaluation of patient's response to treatment, examination of patient, obtaining history from patient or surrogate, ordering and performing treatments and interventions, ordering and review of laboratory studies, ordering and review of radiographic studies, pulse oximetry, re-evaluation of patient's condition and review of old charts   I assumed direction of critical care for this patient from another provider in my specialty: no     (including critical care time)  Medications Ordered in ED Medications  sodium chloride 0.9 % bolus 1,000 mL (has no administration in time range)     Initial Impression / Assessment and Plan / ED Course  I have reviewed the triage vital signs and the nursing notes.  Pertinent labs & imaging results that were available during my care of the patient were reviewed by me and  considered in my medical decision making (see chart for details).  Clinical Course as of Jun 18 2306  Mon Jun 18, 2018  1903 discussed with tele-neurology who did an evaluation on the patient.  Currently he would score him as a 0 for an NIH but he still feels that the speech difficulty does not totally fit with heat related illness.  He is recommending proceeding with a CTA head and neck.  I have ordered these.   [MB]  2023 Patient's angios are negative.  I reevaluated him and his speech is very fluent he is  been interactive with his family.  He states he still feels a little off but cannot really put his finger on what is different now.   [MB]  2211 Medical recommendations from neurology are for patient to be admitted for MRI and further TIA work-up.  I reviewed this with the patient and he is agreeable to stay in the hospital.  Hospitalist has been paged for admission.   [MB]    Clinical Course User Index [MB] Hayden Rasmussen, MD     Final Clinical Impressions(s) / ED Diagnoses   Final diagnoses:  TIA (transient ischemic attack)    ED Discharge Orders    None       Hayden Rasmussen, MD 06/20/18 1047

## 2018-06-19 ENCOUNTER — Observation Stay (HOSPITAL_COMMUNITY): Payer: BLUE CROSS/BLUE SHIELD

## 2018-06-19 ENCOUNTER — Observation Stay (HOSPITAL_BASED_OUTPATIENT_CLINIC_OR_DEPARTMENT_OTHER): Payer: BLUE CROSS/BLUE SHIELD

## 2018-06-19 ENCOUNTER — Other Ambulatory Visit: Payer: Self-pay

## 2018-06-19 DIAGNOSIS — G459 Transient cerebral ischemic attack, unspecified: Secondary | ICD-10-CM | POA: Diagnosis not present

## 2018-06-19 DIAGNOSIS — R209 Unspecified disturbances of skin sensation: Secondary | ICD-10-CM

## 2018-06-19 DIAGNOSIS — I340 Nonrheumatic mitral (valve) insufficiency: Secondary | ICD-10-CM

## 2018-06-19 DIAGNOSIS — I10 Essential (primary) hypertension: Secondary | ICD-10-CM | POA: Diagnosis not present

## 2018-06-19 DIAGNOSIS — R471 Dysarthria and anarthria: Secondary | ICD-10-CM | POA: Diagnosis not present

## 2018-06-19 DIAGNOSIS — R2689 Other abnormalities of gait and mobility: Secondary | ICD-10-CM | POA: Diagnosis not present

## 2018-06-19 DIAGNOSIS — E785 Hyperlipidemia, unspecified: Secondary | ICD-10-CM

## 2018-06-19 DIAGNOSIS — E782 Mixed hyperlipidemia: Secondary | ICD-10-CM

## 2018-06-19 LAB — BASIC METABOLIC PANEL
ANION GAP: 7 (ref 5–15)
BUN: 22 mg/dL — ABNORMAL HIGH (ref 6–20)
CO2: 27 mmol/L (ref 22–32)
CREATININE: 0.92 mg/dL (ref 0.61–1.24)
Calcium: 8.9 mg/dL (ref 8.9–10.3)
Chloride: 108 mmol/L (ref 98–111)
GFR calc non Af Amer: >60 mL/min (ref >60)
Glucose, Bld: 94 mg/dL (ref 70–99)
Potassium: 3.9 mmol/L (ref 3.5–5.1)
SODIUM: 142 mmol/L (ref 135–145)

## 2018-06-19 LAB — CBC
HCT: 45.6 % (ref 39.0–52.0)
HEMOGLOBIN: 15.3 g/dL (ref 13.0–17.0)
MCH: 30.9 pg (ref 26.0–34.0)
MCHC: 33.6 g/dL (ref 30.0–36.0)
MCV: 92.1 fL (ref 78.0–100.0)
PLATELETS: 174 10*3/uL (ref 150–400)
RBC: 4.95 MIL/uL (ref 4.22–5.81)
RDW: 12.8 % (ref 11.5–15.5)
WBC: 7.7 10*3/uL (ref 4.0–10.5)

## 2018-06-19 LAB — LIPID PANEL
CHOL/HDL RATIO: 6.3 ratio
Cholesterol: 226 mg/dL — ABNORMAL HIGH (ref 0–200)
HDL: 36 mg/dL — ABNORMAL LOW (ref >40)
LDL Cholesterol: 158 mg/dL — ABNORMAL HIGH (ref 0–99)
Triglycerides: 162 mg/dL — ABNORMAL HIGH (ref <150)
VLDL: 32 mg/dL (ref 0–40)

## 2018-06-19 LAB — ECHOCARDIOGRAM COMPLETE: WEIGHTICAEL: 3298.08 [oz_av]

## 2018-06-19 LAB — HEMOGLOBIN A1C
Hgb A1c MFr Bld: 5.8 % — ABNORMAL HIGH (ref 4.8–5.6)
MEAN PLASMA GLUCOSE: 119.76 mg/dL

## 2018-06-19 NOTE — Progress Notes (Signed)
OT Cancellation Note  Patient Details Name: Jeffrey Allen MRN: [id number] DOB: 11/24/1956   Cancelled Treatment:    Reason Eval/Treat Not Completed: OT screened, no needs identified, will sign off. Pt reports symptoms have resolved. Pt is at baseline independence with ADLs, no further OT services required at this time.   Guadelupe Sabin, OTR/L  [phone] 06/19/2018, 8:23 AM

## 2018-06-19 NOTE — Consult Note (Addendum)
Jeffrey A. Jeffrey Laughter, MD     [email or web address]          Jeffrey Allen is an 62 y.o. male.   ASSESSMENT/PLAN: 1. Acute onset of mutism/ severe dysarthria due to acute embolic appearing stroke involving the right frontotemporal region.  The multiple small infarcts suggest embolic phenomena either artery to artery or cardioembolic.   This could be classified as a cryptogenic stroke. The patient should have a 30 day event monitor.  Aspirin 325 is recommended.  Follow-up echocardiography.        The patient is a 62 year old right-handed white male who presents with the acute onset of speech arrest on yesterday.  The patient denies any other associated symptoms.  He did feel a sensation of pressure involving the head on the right side but no numbness or focal weakness.  The patient's symptoms gradually improved after several hours.  He does not report having chest pain, shortness of breath or palpitations.  He denies any dysphagia or diplopia.  The review of systems otherwise negative.   GENERAL:   This is a pleasant of white male who is in no acute distress.  HEENT:   This is normal.  ABDOMEN: soft  EXTREMITIES: No edema   BACK:  This is normal.  SKIN: Normal by inspection.    MENTAL STATUS: Alert and oriented. Speech, language and cognition are generally intact. Judgment and insight normal.   CRANIAL NERVES: Pupils are equal, round and reactive to light and accomodation; extra ocular movements are full, there is no significant nystagmus; visual fields are full; upper and lower facial muscles are normal in strength and symmetric, there is no flattening of the nasolabial folds; tongue is midline; uvula is midline; shoulder elevation is normal.  MOTOR: Normal tone, bulk and strength; no pronator drift.  There is no drift of the upper or lower extremities.  COORDINATION: Left finger to nose is normal, right finger to nose is normal, No rest tremor; no intention  tremor; no postural tremor; no bradykinesia.  REFLEXES: Deep tendon reflexes are symmetrical and normal. Plantar reflexes are flexor on the right but equivocal on the left.   SENSATION: Normal to light touch, temperature, and pain. There is no extinction on double simultaneous stimulation.     NIH stroke scale 0.    Blood pressure 135/83, pulse (!) 44, temperature 97.7 F (36.5 C), temperature source Oral, resp. rate 18, weight 206 lb 2.1 oz (93.5 kg), SpO2 95 %.  Past Medical History:  Diagnosis Date  . GERD (gastroesophageal reflux disease)   . Gout   . Peptic ulcer disease     Past Surgical History:  Procedure Laterality Date  . COLONOSCOPY    . COLONOSCOPY N/A 12/07/2016   Procedure: COLONOSCOPY;  Surgeon: Daneil Dolin, MD;  Location: [facility];  Service: Endoscopy;  Laterality: N/A;  9:15 AM    Family History  Problem Relation Age of Onset  . Colon cancer Neg Hx     Social History:  reports that he quit smoking about 11 years ago. His smoking use included cigarettes. He has a 0.50 pack-year smoking history. He has never used smokeless tobacco. He reports that he drinks about 8.4 oz of alcohol per week. He reports that he does not use drugs.  Allergies:  Allergies  Allergen Reactions  . Prednisone Other (See Comments)    Peptic ulcers      Medications: Prior to Admission medications   Medication Sig Start Date End Date  Taking? Authorizing Provider  allopurinol (ZYLOPRIM) 300 MG tablet Take 150 mg by mouth daily. 11/14/16  Yes [provider]  diphenhydramine-acetaminophen (TYLENOL PM) 25-500 MG TABS tablet Take 1 tablet by mouth at bedtime as needed.   Yes [provider]  omeprazole (PRILOSEC OTC) 20 MG tablet Take 20 mg by mouth every 3 (three) days.    Yes [provider]  vitamin B-12 (CYANOCOBALAMIN) 1000 MCG tablet Take 1,000 mcg by mouth daily.   Yes [provider]  vitamin C (ASCORBIC ACID) 500 MG tablet Take  500 mg by mouth daily.   Yes [provider]    Scheduled Meds: .  stroke: mapping our early stages of recovery book   Does not apply Once  . allopurinol  150 mg Oral Daily  . aspirin  300 mg Rectal Daily   Or  . aspirin  325 mg Oral Daily  . atorvastatin  80 mg Oral q1800  . colchicine  0.6 mg Oral Daily  . enoxaparin (LOVENOX) injection  40 mg Subcutaneous Q24H  . pantoprazole  40 mg Oral Daily  . vitamin B-12  1,000 mcg Oral Daily   Continuous Infusions: PRN Meds:.acetaminophen **OR** acetaminophen, diphenhydrAMINE, ondansetron **OR** ondansetron (ZOFRAN) IV     Results for orders placed or performed during the hospital encounter of 06/18/18 (from the past 48 hour(s))  CBG monitoring, ED     Status: Abnormal   Collection Time: 06/18/18  6:17 PM  Result Value Ref Range   Glucose-Capillary 103 (H) 65 - 99 mg/dL  Protime-INR     Status: None   Collection Time: 06/18/18  6:22 PM  Result Value Ref Range   Prothrombin Time 13.3 11.4 - 15.2 seconds   INR 1.02     Comment: Performed at [facility], [address] [place], [place] [address]  APTT     Status: None   Collection Time: 06/18/18  6:22 PM  Result Value Ref Range   aPTT 27 24 - 36 seconds    Comment: Performed at [facility], [address] [place], [place] [address]  CBC     Status: None   Collection Time: 06/18/18  6:22 PM  Result Value Ref Range   WBC 8.9 4.0 - 10.5 K/uL   RBC 5.04 4.22 - 5.81 MIL/uL   Hemoglobin 15.7 13.0 - 17.0 g/dL   HCT 46.2 39.0 - 52.0 %   MCV 91.7 78.0 - 100.0 fL   MCH 31.2 26.0 - 34.0 pg   MCHC 34.0 30.0 - 36.0 g/dL   RDW 12.9 11.5 - 15.5 %   Platelets 189 150 - 400 K/uL    Comment: Performed at [facility], [address] [place], [place] [address]  Differential     Status: None   Collection Time: 06/18/18  6:22 PM  Result Value Ref Range   Neutrophils Relative % 65 %   Neutro Abs 5.8 1.7 - 7.7 K/uL   Lymphocytes Relative 25 %   Lymphs Abs 2.2 0.7 - 4.0 K/uL    Monocytes Relative 7 %   Monocytes Absolute 0.6 0.1 - 1.0 K/uL   Eosinophils Relative 3 %   Eosinophils Absolute 0.3 0.0 - 0.7 K/uL   Basophils Relative 0 %   Basophils Absolute 0.0 0.0 - 0.1 K/uL    Comment: Performed at [facility], [address], [place], [place] [address]  Comprehensive metabolic panel     Status: Abnormal   Collection Time: 06/18/18  6:22 PM  Result  Value Ref Range   Sodium 140 135 - 145 mmol/L   Potassium 4.0 3.5 - 5.1 mmol/L   Chloride 107 101 - 111 mmol/L   CO2 24 22 - 32 mmol/L   Glucose, Bld 94 65 - 99 mg/dL   BUN 27 (H) 6 - 20 mg/dL   Creatinine, Ser 1.03 0.61 - 1.24 mg/dL   Calcium 9.4 8.9 - 10.3 mg/dL   Total Protein 7.2 6.5 - 8.1 g/dL   Albumin 4.1 3.5 - 5.0 g/dL   AST 18 15 - 41 U/L   ALT 20 17 - 63 U/L   Alkaline Phosphatase 67 38 - 126 U/L   Total Bilirubin 0.7 0.3 - 1.2 mg/dL   GFR calc non Af Amer >60 >60 mL/min   GFR calc Af Amer >60 >60 mL/min    Comment: (NOTE) The eGFR has been calculated using the CKD EPI equation. This calculation has not been validated in all clinical situations. eGFR's persistently <60 mL/min signify possible Chronic Kidney Disease.    Anion gap 9 5 - 15    Comment: Performed at [facility], [address] [place], [place] [address]  Urine rapid drug screen (hosp performed)     Status: Abnormal   Collection Time: 06/18/18  6:22 PM  Result Value Ref Range   Opiates NONE DETECTED NONE DETECTED   Cocaine NONE DETECTED NONE DETECTED   Benzodiazepines NONE DETECTED NONE DETECTED   Amphetamines NONE DETECTED NONE DETECTED   Tetrahydrocannabinol NONE DETECTED NONE DETECTED   Barbiturates (A) NONE DETECTED    Result not available. Reagent lot number recalled by manufacturer.    Comment: (NOTE) DRUG SCREEN FOR MEDICAL PURPOSES ONLY.  IF CONFIRMATION IS NEEDED FOR ANY PURPOSE, NOTIFY LAB WITHIN 5 DAYS. LOWEST DETECTABLE LIMITS FOR URINE DRUG SCREEN Drug Class                     Cutoff (ng/mL) Amphetamine and  metabolites    1000 Barbiturate and metabolites    200 Benzodiazepine                 620 Tricyclics and metabolites     300 Opiates and metabolites        300 Cocaine and metabolites        300 THC                            50 Performed at [facility], [address], [place], [place] [address]   Urinalysis, Routine w reflex microscopic     Status: Abnormal   Collection Time: 06/18/18  6:22 PM  Result Value Ref Range   Color, Urine COLORLESS (A) YELLOW   APPearance CLEAR CLEAR   Specific Gravity, Urine 1.030 1.005 - 1.030   pH 6.0 5.0 - 8.0   Glucose, UA NEGATIVE NEGATIVE mg/dL   Hgb urine dipstick NEGATIVE NEGATIVE   Bilirubin Urine NEGATIVE NEGATIVE   Ketones, ur NEGATIVE NEGATIVE mg/dL   Protein, ur NEGATIVE NEGATIVE mg/dL   Nitrite NEGATIVE NEGATIVE   Leukocytes, UA NEGATIVE NEGATIVE    Comment: Performed at [facility], [address] [place], [place] [address]  Magnesium     Status: None   Collection Time: 06/18/18  6:22 PM  Result Value Ref Range   Magnesium 2.2 1.7 - 2.4 mg/dL    Comment: Performed at [facility], [address], [place], [place] [address]  Ethanol     Status:  None   Collection Time: 06/18/18  6:23 PM  Result Value Ref Range   Alcohol, Ethyl (B) <10 <10 mg/dL    Comment: (NOTE) Lowest detectable limit for serum alcohol is 10 mg/dL. For medical purposes only. Performed at [facility], [address], [place], [place] [address]   I-stat troponin, ED     Status: None   Collection Time: 06/18/18  6:28 PM  Result Value Ref Range   Troponin i, poc 0.01 0.00 - 0.08 ng/mL   Comment 3            Comment: Due to the release kinetics of cTnI, a negative result within the first hours of the onset of symptoms does not rule out myocardial infarction with certainty. If myocardial infarction is still suspected, repeat the test at appropriate intervals.   I-Stat Chem 8, ED     Status: Abnormal   Collection Time: 06/18/18  6:30 PM  Result Value Ref Range     Sodium 142 135 - 145 mmol/L   Potassium 4.0 3.5 - 5.1 mmol/L   Chloride 106 101 - 111 mmol/L   BUN 27 (H) 6 - 20 mg/dL   Creatinine, Ser 1.10 0.61 - 1.24 mg/dL   Glucose, Bld 92 65 - 99 mg/dL   Calcium, Ion 1.24 1.15 - 1.40 mmol/L   TCO2 24 22 - 32 mmol/L   Hemoglobin 15.3 13.0 - 17.0 g/dL   HCT 45.0 39.0 - 52.0 %  Hemoglobin A1c     Status: Abnormal   Collection Time: 06/19/18  4:49 AM  Result Value Ref Range   Hgb A1c MFr Bld 5.8 (H) 4.8 - 5.6 %    Comment: (NOTE) Pre diabetes:          5.7%-6.4% Diabetes:              >6.4% Glycemic control for   <7.0% adults with diabetes    Mean Plasma Glucose 119.76 mg/dL    Comment: Performed at [facility], [place] [address], [place], [place] [address]  Lipid panel     Status: Abnormal   Collection Time: 06/19/18  4:49 AM  Result Value Ref Range   Cholesterol 226 (H) 0 - 200 mg/dL   Triglycerides 162 (H) <150 mg/dL    Comment: RESULTS CONFIRMED BY MANUAL DILUTION   HDL 36 (L) >40 mg/dL   Total CHOL/HDL Ratio 6.3 RATIO   VLDL 32 0 - 40 mg/dL   LDL Cholesterol 158 (H) 0 - 99 mg/dL    Comment:        Total Cholesterol/HDL:CHD Risk Coronary Heart Disease Risk Table                     Men   Women  1/2 Average Risk   3.4   3.3  Average Risk       5.0   4.4  2 X Average Risk   9.6   7.1  3 X Average Risk  23.4   11.0        Use the calculated Patient Ratio above and the CHD Risk Table to determine the patient's CHD Risk.        ATP III CLASSIFICATION (LDL):  <100     mg/dL   Optimal  100-129  mg/dL   Near or Above                    Optimal  130-159  mg/dL   Borderline  160-189  mg/dL   High  >190     mg/dL   Very High Performed at [facility], [address], [place], [place] [address]   Basic metabolic panel     Status: Abnormal   Collection Time: 06/19/18  4:49 AM  Result Value Ref Range   Sodium 142 135 - 145 mmol/L   Potassium 3.9 3.5 - 5.1 mmol/L   Chloride 108 98 - 111 mmol/L   CO2 27 22 - 32 mmol/L    Glucose, Bld 94 70 - 99 mg/dL   BUN 22 (H) 6 - 20 mg/dL   Creatinine, Ser 0.92 0.61 - 1.24 mg/dL   Calcium 8.9 8.9 - 10.3 mg/dL   GFR calc non Af Amer >60 >60 mL/min   GFR calc Af Amer >60 >60 mL/min    Comment: (NOTE) The eGFR has been calculated using the CKD EPI equation. This calculation has not been validated in all clinical situations. eGFR's persistently <60 mL/min signify possible Chronic Kidney Disease.    Anion gap 7 5 - 15    Comment: Performed at [facility], [address] [place], [place] [address]  CBC     Status: None   Collection Time: 06/19/18  4:49 AM  Result Value Ref Range   WBC 7.7 4.0 - 10.5 K/uL   RBC 4.95 4.22 - 5.81 MIL/uL   Hemoglobin 15.3 13.0 - 17.0 g/dL   HCT 45.6 39.0 - 52.0 %   MCV 92.1 78.0 - 100.0 fL   MCH 30.9 26.0 - 34.0 pg   MCHC 33.6 30.0 - 36.0 g/dL   RDW 12.8 11.5 - 15.5 %   Platelets 174 150 - 400 K/uL    Comment: Performed at [facility], [address], [place], [place] [address]    Studies/Results:  BRAIN MRI FINDINGS: MRI HEAD FINDINGS  Brain: 4 small acute cortical infarcts in the right frontal lobe extending from the operculum towards the vertex, MCA distribution. The largest measures up to 13 mm in length. No hemorrhage, hydrocephalus, or pre-existing infarct. No masslike finding or collection.  Vascular: Arterial findings below. Normal dural venous sinus flow voids  Skull and upper cervical spine: Normal marrow signal  Sinuses/Orbits: Negative  Other: Progressively motion degraded.  MRA HEAD FINDINGS  The ICAs are tortuous at the skull base. Fetal type right PCA no branch occlusion, beading, or aneurysm. Negative for stenosis.  IMPRESSION: Brain MRI:  1. Small patchy acute infarcts along the right frontal lobe, MCA territory. 2. Elsewhere normal appearance of the brain. 3. Progressively motion degraded study.  Intracranial MRA:  Negative.     CTA HEAD NECK FINDINGS: CTA NECK  FINDINGS  AORTIC ARCH: There is no calcific atherosclerosis of the aortic arch. There is no aneurysm, dissection or hemodynamically significant stenosis of the visualized ascending aorta and aortic arch. Conventional 3 vessel aortic branching pattern. The visualized proximal subclavian arteries are widely patent.  RIGHT CAROTID SYSTEM:  --Common carotid artery: Widely patent origin without common carotid artery dissection or aneurysm.  --Internal carotid artery: No dissection, occlusion or aneurysm. No hemodynamically significant stenosis.  --External carotid artery: No acute abnormality.  LEFT CAROTID SYSTEM:  --Common carotid artery: Widely patent origin without common carotid artery dissection or aneurysm.  --Internal carotid artery:No dissection, occlusion or aneurysm. No hemodynamically significant stenosis.  --External carotid artery: No acute abnormality.  VERTEBRAL ARTERIES: Left dominant configuration. Both origins are normal. No dissection, occlusion or flow-limiting stenosis to the vertebrobasilar confluence.  SKELETON: There is no bony spinal  canal stenosis. No lytic or blastic lesion.  OTHER NECK: Normal pharynx, larynx and major salivary glands. No cervical lymphadenopathy. Unremarkable thyroid gland.  UPPER CHEST: No pneumothorax or pleural effusion. No nodules or masses.  CTA HEAD FINDINGS  ANTERIOR CIRCULATION:  --Intracranial internal carotid arteries: Normal.  --Anterior cerebral arteries: Normal. Both A1 segments are present. Patent anterior communicating artery.  --Middle cerebral arteries: Normal.  --Posterior communicating arteries: Present bilaterally.  POSTERIOR CIRCULATION:  --Basilar artery: Normal.  --Posterior cerebral arteries: Normal.  --Superior cerebellar arteries: Normal.  --Inferior cerebellar arteries: Normal anterior and posterior inferior cerebellar arteries.  VENOUS SINUSES: As permitted  by contrast timing, patent.  ANATOMIC VARIANTS: Fetal origin of the right PCA.  DELAYED PHASE: No parenchymal contrast enhancement.  Review of the MIP images confirms the above findings.  IMPRESSION: No emergent large vessel occlusion or hemodynamically significant stenosis of the arteries of the head and neck.         THE BRAIN MRI MRA REVIEWED IN PERSON.  There are several small to moderate-sized infarcts mostly cortical based involving the right frontal and right temple region.  The pattern seems consistent with embolic phenomenon.  I do not appreciate reduced signal on the ADC scan.  No hemorrhage appreciated.  No abnormalities seen on FLAIR imaging. No large vessel intracranial occlusive disease is identified.  The right PCA comes off the ICA consistent with fetal circulation pattern.    EKG strips are reviewed and shows a sinus although there is 1 episode of sinus bradycardia.  Sanaz Scarlett A. Jeffrey Allen, M.D.  Diplomate, Tax adviser of Psychiatry and Neurology ( Neurology). 06/19/2018, 1:24 PM

## 2018-06-19 NOTE — Evaluation (Signed)
Physical Therapy Evaluation Patient Details Name: Jeffrey Allen MRN: [id number] DOB: December 24, 1956 Today's Date: 06/19/2018   History of Present Illness  62 year old male with a history of GERD and peptic ulcer disease presenting with acute onset of dysphasia in the afternoon of 6519.  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  He was inside for approximately 10 minutes.  When he came back out he noted some tingling on the top of his scalp followed by onset of dysphasia.  He denied any aura or loss of consciousness, he did feel some dizziness and felt like he was "staggering".  He had denied any fevers, chills, chest pain, shortness breath, palpitations, syncope.  He has not been started on any new medications.  Upon presentation to the emergency department, the patient was improving, but continued to have some dysarthria.  He had denied any focal extremity weakness, dysesthesias, or visual disturbance.  Initial CT of the head and neck was negative for large vessel occlusion or hemodynamically significant stenosis.  CT of the brain was negative.    Clinical Impression  Patient functioning at baseline for functional mobility and gait.  Plan:  Patient discharged from physical therapy to care of nursing for ambulation daily as tolerated for length of stay.    Follow Up Recommendations No PT follow up    Equipment Recommendations  None recommended by PT    Recommendations for Other Services       Precautions / Restrictions Precautions Precautions: None Restrictions Weight Bearing Restrictions: No      Mobility  Bed Mobility Overal bed mobility: Independent                Transfers Overall transfer level: Independent                  Ambulation/Gait Ambulation/Gait assistance: Independent Gait Distance (Feet): 200 Feet Assistive device: None Gait Pattern/deviations: WFL(Within Functional Limits) Gait velocity: normal   General Gait  Details: [facility]  Stairs            Wheelchair Mobility    Modified Rankin (Stroke Patients Only)       Balance Overall balance assessment: No apparent balance deficits (not formally assessed)                                           Pertinent Vitals/Pain Pain Assessment: No/denies pain    Home Living Family/patient expects to be discharged to:: Private residence Living Arrangements: Spouse/significant other Available Help at Discharge: Family Type of Home: House Home Access: Stairs to enter Entrance Stairs-Rails: None Entrance Stairs-Number of Steps: 1 Home Layout: Multi-level;Able to live on main level with bedroom/bathroom Home Equipment: Kasandra Knudsen - single point;Walker - 2 wheels Additional Comments: equipment that was left over from his father    Prior Function Level of Independence: Independent         Comments: community ambulator, drives     Hand Dominance   Dominant Hand: Right    Extremity/Trunk Assessment   Upper Extremity Assessment Upper Extremity Assessment: Defer to OT evaluation    Lower Extremity Assessment Lower Extremity Assessment: Overall WFL for tasks assessed    Cervical / Trunk Assessment Cervical / Trunk Assessment: Normal  Communication   Communication: No difficulties  Cognition Arousal/Alertness: Awake/alert Behavior During Therapy: WFL for tasks assessed/performed Overall Cognitive Status: Within Functional Limits for tasks  assessed                                        General Comments      Exercises     Assessment/Plan    PT Assessment Patent does not need any further PT services  PT Problem List         PT Treatment Interventions      PT Goals (Current goals can be found in the Care Plan section)  Acute Rehab PT Goals Patient Stated Goal: return home PT Goal Formulation: With patient Time For Goal Achievement: 2018/07/12 Potential to Achieve Goals: Good    Frequency      Barriers to discharge        Co-evaluation               AM-PAC PT "6 Clicks" Daily Activity  Outcome Measure Difficulty turning over in bed (including adjusting bedclothes, sheets and blankets)?: None Difficulty moving from lying on back to sitting on the side of the bed? : None Difficulty sitting down on and standing up from a chair with arms (e.g., wheelchair, bedside commode, etc,.)?: None Help needed moving to and from a bed to chair (including a wheelchair)?: None Help needed walking in hospital room?: None Help needed climbing 3-5 steps with a railing? : None 6 Click Score: 24    End of Session   Activity Tolerance: Patient tolerated treatment well Patient left: in bed;with call bell/phone within reach Nurse Communication: Mobility status PT Visit Diagnosis: Unsteadiness on feet (R26.81);Other abnormalities of gait and mobility (R26.89);Muscle weakness (generalized) (M62.81)    Time: 5320-2334 PT Time Calculation (min) (ACUTE ONLY): 15 min   Charges:   PT Evaluation $PT Eval Low Complexity: 1 Low PT Treatments $Gait Training: 8-22 mins   PT G Codes:        2:13 PM, 07/12/2018 Lonell Grandchild, MPT Physical Therapist with [facility] [phone] office [phone] mobile phone

## 2018-06-19 NOTE — Progress Notes (Signed)
Echocardiogram 2D Echocardiogram has been performed.  Jeffrey Allen 06/19/2018, 2:21 PM

## 2018-06-19 NOTE — Progress Notes (Signed)
SLP Cancellation Note  Patient Details Name: Jeffrey Allen MRN: [id number] DOB: 1956/02/10   Cancelled treatment:       Reason Eval/Treat Not Completed: SLP screened, no needs identified, will sign off; SLP screened Pt in room. Pt denies any changes in swallowing, speech, language, or cognition. MRI negative for acute changes. SLE will be deferred at this time. Reconsult if indicated. SLP will sign off.  Thank you,  Genene Churn, [place]    [place] 06/19/2018, 12:28 PM

## 2018-06-19 NOTE — H&P (Signed)
PROGRESS NOTE  Jeffrey Allen DUK:[id number] DOB: 02-13-1956 DOA: 06/18/2018 PCP: Sandi Mariscal, MD  Brief History:  62 year old male with a history of GERD and peptic ulcer disease presenting with acute onset of dysphasia in the afternoon of 6519.  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  He was inside for approximately 10 minutes.  When he came back out he noted some tingling on the top of his scalp followed by onset of dysphasia.  He denied any aura or loss of consciousness, he did feel some dizziness and felt like he was "staggering".  He had denied any fevers, chills, chest pain, shortness breath, palpitations, syncope.  He has not been started on any new medications.  Upon presentation to the emergency department, the patient was improving, but continued to have some dysarthria.  He had denied any focal extremity weakness, dysesthesias, or visual disturbance.  Initial CT of the head and neck was negative for large vessel occlusion or hemodynamically significant stenosis.  CT of the brain was negative.  Assessment/Plan: Dysphasia/dysarthria -suspect TIA -now completely resolved -Neurology Consult -PT/OT evaluation -Speech therapy eval -CT brain--neg -MRI brain-- -CTA H&N--neg -Echo-- -LDL--158 -HbA1C--pending -Antiplatelet--ASA 325 mg -EEG  Hyperlipidemia -Start Lipitor  Essential hypertension -Patient has been told he had hypertension in the past, but is not on any medications -Allowing permissive hypertension for now -Start antihypertensive medications after discharge  Gouty arthritis -Continue allopurinol and colchicine     Disposition Plan:   Home 6/26 if stable Family Communication:  No Family at bedside  Consultants:  neurology  Code Status:  FULL   DVT Prophylaxis:  [place] Lovenox   Procedures: As Listed in Progress Note Above  Antibiotics: None    Subjective: Patient denies fevers, chills, headache, chest pain,  dyspnea, nausea, vomiting, diarrhea, abdominal pain, dysuria, hematuria, hematochezia, and melena.   Objective: Vitals:   06/18/18 2345 06/19/18 0046 06/19/18 0244 06/19/18 0645  BP: (!) 159/84 (!) 145/94 (!) 144/97 (!) 161/92  Pulse: 70 71 65 60  Resp:      Temp:  98.2 F (36.8 C) 98.2 F (36.8 C) 97.8 F (36.6 C)  TempSrc:  Oral Oral Oral  SpO2: 95% 97% 99% 96%  Weight:  93.5 kg (206 lb 2.1 oz)      Intake/Output Summary (Last 24 hours) at 06/19/2018 3762 Last data filed at 06/19/2018 0323 Gross per 24 hour  Intake 2000 ml  Output -  Net 2000 ml   Weight change:  Exam:   General:  Pt is alert, follows commands appropriately, not in acute distress  HEENT: No icterus, No thrush, No neck mass, [place]/AT  Cardiovascular: RRR, S1/S2, no rubs, no gallops  Respiratory: CTA bilaterally, no wheezing, no crackles, no rhonchi  Abdomen: Soft/+BS, non tender, non distended, no guarding  Extremities: No edema, No lymphangitis, No petechiae, No rashes, no synovitis  Neuro:  CN II-XII intact, strength 4/5 in RUE, RLE, strength 4/5 LUE, LLE; sensation intact bilateral; no dysmetria; babinski equivocal     Data Reviewed: I have personally reviewed following labs and imaging studies Basic Metabolic Panel: Recent Labs  Lab 06/18/18 1822 06/18/18 1830 06/19/18 0449  NA 140 142 142  K 4.0 4.0 3.9  CL 107 106 108  CO2 24  --  27  GLUCOSE 94 92 94  BUN 27* 27* 22*  CREATININE 1.03 1.10 0.92  CALCIUM 9.4  --  8.9  MG  2.2  --   --    Liver Function Tests: Recent Labs  Lab 06/18/18 1822  AST 18  ALT 20  ALKPHOS 67  BILITOT 0.7  PROT 7.2  ALBUMIN 4.1   No results for input(s): LIPASE, AMYLASE in the last 168 hours. No results for input(s): AMMONIA in the last 168 hours. Coagulation Profile: Recent Labs  Lab 06/18/18 1822  INR 1.02   CBC: Recent Labs  Lab 06/18/18 1822 06/18/18 1830 06/19/18 0449  WBC 8.9  --  7.7  NEUTROABS 5.8  --   --   HGB 15.7 15.3 15.3    HCT 46.2 45.0 45.6  MCV 91.7  --  92.1  PLT 189  --  174   Cardiac Enzymes: No results for input(s): CKTOTAL, CKMB, CKMBINDEX, TROPONINI in the last 168 hours. BNP: Invalid input(s): POCBNP CBG: Recent Labs  Lab 06/18/18 1817  GLUCAP 103*   HbA1C: No results for input(s): HGBA1C in the last 72 hours. Urine analysis:    Component Value Date/Time   COLORURINE COLORLESS (A) 06/18/2018 1822   APPEARANCEUR CLEAR 06/18/2018 1822   LABSPEC 1.030 06/18/2018 1822   PHURINE 6.0 06/18/2018 1822   GLUCOSEU NEGATIVE 06/18/2018 1822   HGBUR NEGATIVE 06/18/2018 1822   BILIRUBINUR NEGATIVE 06/18/2018 1822   KETONESUR NEGATIVE 06/18/2018 1822   PROTEINUR NEGATIVE 06/18/2018 1822   UROBILINOGEN 0.2 09/04/2015 2156   NITRITE NEGATIVE 06/18/2018 1822   LEUKOCYTESUR NEGATIVE 06/18/2018 1822   Sepsis Labs: @LABRCNTIP (procalcitonin:4,lacticidven:4) )No results found for this or any previous visit (from the past 240 hour(s)).   Scheduled Meds: .  stroke: mapping our early stages of recovery book   Does not apply Once  . allopurinol  150 mg Oral Daily  . aspirin  300 mg Rectal Daily   Or  . aspirin  325 mg Oral Daily  . atorvastatin  80 mg Oral q1800  . colchicine  0.6 mg Oral Daily  . enoxaparin (LOVENOX) injection  40 mg Subcutaneous Q24H  . pantoprazole  40 mg Oral Daily  . vitamin B-12  1,000 mcg Oral Daily   Continuous Infusions: . lactated ringers 100 mL/hr at 06/19/18 1607    Procedures/Studies: Ct Angio Head W Or Wo Contrast  Result Date: 06/18/2018 CLINICAL DATA:  Speech difficulty.  Weakness. EXAM: CT ANGIOGRAPHY HEAD AND NECK TECHNIQUE: Multidetector CT imaging of the head and neck was performed using the standard protocol during bolus administration of intravenous contrast. Multiplanar CT image reconstructions and MIPs were obtained to evaluate the vascular anatomy. Carotid stenosis measurements (when applicable) are obtained utilizing NASCET criteria, using the distal  internal carotid diameter as the denominator. CONTRAST:  166mL ISOVUE-370 IOPAMIDOL (ISOVUE-370) INJECTION 76% COMPARISON:  Head CT 06/18/2018 FINDINGS: CTA NECK FINDINGS AORTIC ARCH: There is no calcific atherosclerosis of the aortic arch. There is no aneurysm, dissection or hemodynamically significant stenosis of the visualized ascending aorta and aortic arch. Conventional 3 vessel aortic branching pattern. The visualized proximal subclavian arteries are widely patent. RIGHT CAROTID SYSTEM: --Common carotid artery: Widely patent origin without common carotid artery dissection or aneurysm. --Internal carotid artery: No dissection, occlusion or aneurysm. No hemodynamically significant stenosis. --External carotid artery: No acute abnormality. LEFT CAROTID SYSTEM: --Common carotid artery: Widely patent origin without common carotid artery dissection or aneurysm. --Internal carotid artery:No dissection, occlusion or aneurysm. No hemodynamically significant stenosis. --External carotid artery: No acute abnormality. VERTEBRAL ARTERIES: Left dominant configuration. Both origins are normal. No dissection, occlusion or flow-limiting stenosis to the vertebrobasilar confluence. SKELETON: There  is no bony spinal canal stenosis. No lytic or blastic lesion. OTHER NECK: Normal pharynx, larynx and major salivary glands. No cervical lymphadenopathy. Unremarkable thyroid gland. UPPER CHEST: No pneumothorax or pleural effusion. No nodules or masses. CTA HEAD FINDINGS ANTERIOR CIRCULATION: --Intracranial internal carotid arteries: Normal. --Anterior cerebral arteries: Normal. Both A1 segments are present. Patent anterior communicating artery. --Middle cerebral arteries: Normal. --Posterior communicating arteries: Present bilaterally. POSTERIOR CIRCULATION: --Basilar artery: Normal. --Posterior cerebral arteries: Normal. --Superior cerebellar arteries: Normal. --Inferior cerebellar arteries: Normal anterior and posterior inferior  cerebellar arteries. VENOUS SINUSES: As permitted by contrast timing, patent. ANATOMIC VARIANTS: Fetal origin of the right PCA. DELAYED PHASE: No parenchymal contrast enhancement. Review of the MIP images confirms the above findings. IMPRESSION: No emergent large vessel occlusion or hemodynamically significant stenosis of the arteries of the head and neck. Electronically Signed   By: Ulyses Jarred M.D.   On: 06/18/2018 20:20   Ct Head Wo Contrast  Result Date: 06/18/2018 CLINICAL DATA:  62 y/o  M; expressive aphasia. EXAM: CT HEAD WITHOUT CONTRAST TECHNIQUE: Contiguous axial images were obtained from the base of the skull through the vertex without intravenous contrast. COMPARISON:  None. FINDINGS: Brain: No evidence of acute infarction, hemorrhage, hydrocephalus, extra-axial collection or mass lesion/mass effect. Vascular: No hyperdense vessel or unexpected calcification. Skull: Normal. Negative for fracture or focal lesion. Sinuses/Orbits: No acute finding. Other: None. IMPRESSION: No acute intracranial abnormality identified. Unremarkable CT of the head for age. Electronically Signed   By: Kristine Garbe M.D.   On: 06/18/2018 18:57   Ct Angio Neck W And/or Wo Contrast  Result Date: 06/18/2018 CLINICAL DATA:  Speech difficulty.  Weakness. EXAM: CT ANGIOGRAPHY HEAD AND NECK TECHNIQUE: Multidetector CT imaging of the head and neck was performed using the standard protocol during bolus administration of intravenous contrast. Multiplanar CT image reconstructions and MIPs were obtained to evaluate the vascular anatomy. Carotid stenosis measurements (when applicable) are obtained utilizing NASCET criteria, using the distal internal carotid diameter as the denominator. CONTRAST:  183mL ISOVUE-370 IOPAMIDOL (ISOVUE-370) INJECTION 76% COMPARISON:  Head CT 06/18/2018 FINDINGS: CTA NECK FINDINGS AORTIC ARCH: There is no calcific atherosclerosis of the aortic arch. There is no aneurysm, dissection or  hemodynamically significant stenosis of the visualized ascending aorta and aortic arch. Conventional 3 vessel aortic branching pattern. The visualized proximal subclavian arteries are widely patent. RIGHT CAROTID SYSTEM: --Common carotid artery: Widely patent origin without common carotid artery dissection or aneurysm. --Internal carotid artery: No dissection, occlusion or aneurysm. No hemodynamically significant stenosis. --External carotid artery: No acute abnormality. LEFT CAROTID SYSTEM: --Common carotid artery: Widely patent origin without common carotid artery dissection or aneurysm. --Internal carotid artery:No dissection, occlusion or aneurysm. No hemodynamically significant stenosis. --External carotid artery: No acute abnormality. VERTEBRAL ARTERIES: Left dominant configuration. Both origins are normal. No dissection, occlusion or flow-limiting stenosis to the vertebrobasilar confluence. SKELETON: There is no bony spinal canal stenosis. No lytic or blastic lesion. OTHER NECK: Normal pharynx, larynx and major salivary glands. No cervical lymphadenopathy. Unremarkable thyroid gland. UPPER CHEST: No pneumothorax or pleural effusion. No nodules or masses. CTA HEAD FINDINGS ANTERIOR CIRCULATION: --Intracranial internal carotid arteries: Normal. --Anterior cerebral arteries: Normal. Both A1 segments are present. Patent anterior communicating artery. --Middle cerebral arteries: Normal. --Posterior communicating arteries: Present bilaterally. POSTERIOR CIRCULATION: --Basilar artery: Normal. --Posterior cerebral arteries: Normal. --Superior cerebellar arteries: Normal. --Inferior cerebellar arteries: Normal anterior and posterior inferior cerebellar arteries. VENOUS SINUSES: As permitted by contrast timing, patent. ANATOMIC VARIANTS: Fetal origin of the right PCA. DELAYED  PHASE: No parenchymal contrast enhancement. Review of the MIP images confirms the above findings. IMPRESSION: No emergent large vessel occlusion  or hemodynamically significant stenosis of the arteries of the head and neck. Electronically Signed   By: Ulyses Jarred M.D.   On: 06/18/2018 20:20    Orson Eva, DO  [facility] Pager [phone]  If 7PM-7AM, please contact night-coverage [email or web address] Password [email or web address] 06/19/2018, 8:21 AM   LOS: 0 days

## 2018-06-20 ENCOUNTER — Observation Stay (HOSPITAL_COMMUNITY)
Admit: 2018-06-20 | Discharge: 2018-06-20 | Disposition: A | Discharge status: Home / Self Care | Payer: BLUE CROSS/BLUE SHIELD | Attending: Internal Medicine | Admitting: Internal Medicine

## 2018-06-20 DIAGNOSIS — R569 Unspecified convulsions: Secondary | ICD-10-CM | POA: Diagnosis not present

## 2018-06-20 DIAGNOSIS — I639 Cerebral infarction, unspecified: Secondary | ICD-10-CM

## 2018-06-20 LAB — HIV ANTIBODY (ROUTINE TESTING W REFLEX): HIV Screen 4th Generation wRfx: NONREACTIVE

## 2018-06-20 MED ORDER — ATORVASTATIN CALCIUM 80 MG PO TABS: 80.0000 mg | ORAL_TABLET | Freq: Every day | ORAL | 2 refills | Status: DC

## 2018-06-20 MED ORDER — ASPIRIN 325 MG PO TABS: 325.0000 mg | ORAL_TABLET | Freq: Every day | ORAL | Status: DC

## 2018-06-20 NOTE — Discharge Summary (Signed)
Physician Discharge Summary  Jeffrey Allen JJH:[id number] DOB: May 02, 1956 DOA: 06/18/2018  PCP: Sandi Mariscal, MD  Admit date: 06/18/2018 Discharge date: 06/20/2018  Time spent: 45 minutes  Recommendations for Outpatient Follow-up:  -Will be discharged home today. -30 day event monitor will be arranged thru the cardiology office. -Advised follow up with PCP in 2 weeks and with neurologist in 3 months.  Discharge Diagnoses:  Principal Problem:   Acute CVA (cerebrovascular accident) [facility]) Active Problems:   Disturbance of skin sensation   Gout   GERD (gastroesophageal reflux disease)   Essential hypertension   Hyperlipidemia   Discharge Condition: Stable and improved  Filed Weights   06/19/18 0046  Weight: 93.5 kg (206 lb 2.1 oz)    History of present illness:  As per Dr. Manuella Ghazi on 6/24: Jeffrey Allen is a 62 y.o. male with medical history significant for GERD with peptic ulcer disease, and gout who arrived via EMS after working in his barn and tractor outside in the heat all day.  He apparently was showing some people his trailer and it was quite hot inside and he began to feel dizzy and lightheaded.  More worrisome, he had numbness to the left side of his face and trouble forming words to the point where he became completely aphasic.  He was covered with cool water and some ice and brought via EMS to the ED.  After approximately an hour, he began to regain control of his speech but he is still quite slurred.  He states that he feels weak all over but denies any focal numbness or weakness to any of his extremities.  He denies any headache, visual change, nausea, or vomiting.  He states that he has been trying to eat and drink frequently due to the heat.   ED Course: Vital signs are stable along with lab work.  Urine analysis negative for any acute abnormalities.  He has been seen by tele-neurology and CT of the head as well as CT angiogram of head and neck were performed with no  acute findings.  EKG with some sinus rhythm and ventricular trigeminy.  He has thus far received a bolus of IV fluid and states that he is feeling much better.  Recommendations are for MRI in a.m. and further CVA rule out.    Hospital Course:   Acute CVA -MRI shows small patchy acute infarcts along the right frontal lobe in the MCA territory.  MRA was negative. -2D echo: Ejection fraction of 55 to 60%, normal wall motion, normal left ventricular diastolic function parameters. -CTA of the neck and head showed no emergent large vessel occlusion or hemodynamically significant stenosis. -Seen by neurology who is recommended full dose aspirin as well as a 30-day event monitor that will be arranged through [facility] cardi[facility] office. -LDL is 158, has been started on high intensity statin: Lipitor 80 mg daily. -Seen by physical therapy who recommends no follow-up.  Hyperlipidemia -LDL 158, total cholesterol 226, HDL 36 -Newly started on atorvastatin 80 mg daily.  History of arthritis -Continue allopurinol.  Benign essential hypertension -Not on any medications currently, will require close monitoring in the outpatient setting. -Blood pressure currently well controlled in the 120/80 range.  Procedures:  As above  Consultations:  Neurology  Discharge Instructions  Discharge Instructions    Diet - low sodium heart healthy   Complete by:  As directed    Increase activity slowly   Complete by:  As directed  Allergies as of 06/20/2018      Reactions   Prednisone Other (See Comments)   Peptic ulcers      Medication List    TAKE these medications   allopurinol 300 MG tablet Commonly known as:  ZYLOPRIM Take 150 mg by mouth daily.   aspirin 325 MG tablet Take 1 tablet (325 mg total) by mouth daily. Start taking on:  06/21/2018   atorvastatin 80 MG tablet Commonly known as:  LIPITOR Take 1 tablet (80 mg total) by mouth daily at 6 PM.     diphenhydramine-acetaminophen 25-500 MG Tabs tablet Commonly known as:  TYLENOL PM Take 1 tablet by mouth at bedtime as needed.   omeprazole 20 MG tablet Commonly known as:  PRILOSEC OTC Take 20 mg by mouth every 3 (three) days.   vitamin B-12 1000 MCG tablet Commonly known as:  CYANOCOBALAMIN Take 1,000 mcg by mouth daily.   vitamin C 500 MG tablet Commonly known as:  ASCORBIC ACID Take 500 mg by mouth daily.      Allergies  Allergen Reactions  . Prednisone Other (See Comments)    Peptic ulcers     Follow-up Information    Sandi Mariscal, MD. Schedule an appointment as soon as possible for a visit in 2 week(s).   Specialty:  Internal Medicine Contact information: [place] [address] [phone]            The results of significant diagnostics from this hospitalization (including imaging, microbiology, ancillary and laboratory) are listed below for reference.    Significant Diagnostic Studies: Ct Angio Head W Or Wo Contrast  Result Date: 06/18/2018 CLINICAL DATA:  Speech difficulty.  Weakness. EXAM: CT ANGIOGRAPHY HEAD AND NECK TECHNIQUE: Multidetector CT imaging of the head and neck was performed using the standard protocol during bolus administration of intravenous contrast. Multiplanar CT image reconstructions and MIPs were obtained to evaluate the vascular anatomy. Carotid stenosis measurements (when applicable) are obtained utilizing NASCET criteria, using the distal internal carotid diameter as the denominator. CONTRAST:  139mL ISOVUE-370 IOPAMIDOL (ISOVUE-370) INJECTION 76% COMPARISON:  Head CT 06/18/2018 FINDINGS: CTA NECK FINDINGS AORTIC ARCH: There is no calcific atherosclerosis of the aortic arch. There is no aneurysm, dissection or hemodynamically significant stenosis of the visualized ascending aorta and aortic arch. Conventional 3 vessel aortic branching pattern. The visualized proximal subclavian arteries are widely patent. RIGHT CAROTID  SYSTEM: --Common carotid artery: Widely patent origin without common carotid artery dissection or aneurysm. --Internal carotid artery: No dissection, occlusion or aneurysm. No hemodynamically significant stenosis. --External carotid artery: No acute abnormality. LEFT CAROTID SYSTEM: --Common carotid artery: Widely patent origin without common carotid artery dissection or aneurysm. --Internal carotid artery:No dissection, occlusion or aneurysm. No hemodynamically significant stenosis. --External carotid artery: No acute abnormality. VERTEBRAL ARTERIES: Left dominant configuration. Both origins are normal. No dissection, occlusion or flow-limiting stenosis to the vertebrobasilar confluence. SKELETON: There is no bony spinal canal stenosis. No lytic or blastic lesion. OTHER NECK: Normal pharynx, larynx and major salivary glands. No cervical lymphadenopathy. Unremarkable thyroid gland. UPPER CHEST: No pneumothorax or pleural effusion. No nodules or masses. CTA HEAD FINDINGS ANTERIOR CIRCULATION: --Intracranial internal carotid arteries: Normal. --Anterior cerebral arteries: Normal. Both A1 segments are present. Patent anterior communicating artery. --Middle cerebral arteries: Normal. --Posterior communicating arteries: Present bilaterally. POSTERIOR CIRCULATION: --Basilar artery: Normal. --Posterior cerebral arteries: Normal. --Superior cerebellar arteries: Normal. --Inferior cerebellar arteries: Normal anterior and posterior inferior cerebellar arteries. VENOUS SINUSES: As permitted by contrast timing, patent. ANATOMIC VARIANTS:  Fetal origin of the right PCA. DELAYED PHASE: No parenchymal contrast enhancement. Review of the MIP images confirms the above findings. IMPRESSION: No emergent large vessel occlusion or hemodynamically significant stenosis of the arteries of the head and neck. Electronically Signed   By: Ulyses Jarred M.D.   On: 06/18/2018 20:20   Ct Head Wo Contrast  Result Date: 06/18/2018 CLINICAL DATA:   62 y/o  M; expressive aphasia. EXAM: CT HEAD WITHOUT CONTRAST TECHNIQUE: Contiguous axial images were obtained from the base of the skull through the vertex without intravenous contrast. COMPARISON:  None. FINDINGS: Brain: No evidence of acute infarction, hemorrhage, hydrocephalus, extra-axial collection or mass lesion/mass effect. Vascular: No hyperdense vessel or unexpected calcification. Skull: Normal. Negative for fracture or focal lesion. Sinuses/Orbits: No acute finding. Other: None. IMPRESSION: No acute intracranial abnormality identified. Unremarkable CT of the head for age. Electronically Signed   By: Kristine Garbe M.D.   On: 06/18/2018 18:57   Ct Angio Neck W And/or Wo Contrast  Result Date: 06/18/2018 CLINICAL DATA:  Speech difficulty.  Weakness. EXAM: CT ANGIOGRAPHY HEAD AND NECK TECHNIQUE: Multidetector CT imaging of the head and neck was performed using the standard protocol during bolus administration of intravenous contrast. Multiplanar CT image reconstructions and MIPs were obtained to evaluate the vascular anatomy. Carotid stenosis measurements (when applicable) are obtained utilizing NASCET criteria, using the distal internal carotid diameter as the denominator. CONTRAST:  139mL ISOVUE-370 IOPAMIDOL (ISOVUE-370) INJECTION 76% COMPARISON:  Head CT 06/18/2018 FINDINGS: CTA NECK FINDINGS AORTIC ARCH: There is no calcific atherosclerosis of the aortic arch. There is no aneurysm, dissection or hemodynamically significant stenosis of the visualized ascending aorta and aortic arch. Conventional 3 vessel aortic branching pattern. The visualized proximal subclavian arteries are widely patent. RIGHT CAROTID SYSTEM: --Common carotid artery: Widely patent origin without common carotid artery dissection or aneurysm. --Internal carotid artery: No dissection, occlusion or aneurysm. No hemodynamically significant stenosis. --External carotid artery: No acute abnormality. LEFT CAROTID SYSTEM:  --Common carotid artery: Widely patent origin without common carotid artery dissection or aneurysm. --Internal carotid artery:No dissection, occlusion or aneurysm. No hemodynamically significant stenosis. --External carotid artery: No acute abnormality. VERTEBRAL ARTERIES: Left dominant configuration. Both origins are normal. No dissection, occlusion or flow-limiting stenosis to the vertebrobasilar confluence. SKELETON: There is no bony spinal canal stenosis. No lytic or blastic lesion. OTHER NECK: Normal pharynx, larynx and major salivary glands. No cervical lymphadenopathy. Unremarkable thyroid gland. UPPER CHEST: No pneumothorax or pleural effusion. No nodules or masses. CTA HEAD FINDINGS ANTERIOR CIRCULATION: --Intracranial internal carotid arteries: Normal. --Anterior cerebral arteries: Normal. Both A1 segments are present. Patent anterior communicating artery. --Middle cerebral arteries: Normal. --Posterior communicating arteries: Present bilaterally. POSTERIOR CIRCULATION: --Basilar artery: Normal. --Posterior cerebral arteries: Normal. --Superior cerebellar arteries: Normal. --Inferior cerebellar arteries: Normal anterior and posterior inferior cerebellar arteries. VENOUS SINUSES: As permitted by contrast timing, patent. ANATOMIC VARIANTS: Fetal origin of the right PCA. DELAYED PHASE: No parenchymal contrast enhancement. Review of the MIP images confirms the above findings. IMPRESSION: No emergent large vessel occlusion or hemodynamically significant stenosis of the arteries of the head and neck. Electronically Signed   By: Ulyses Jarred M.D.   On: 06/18/2018 20:20   Mr Jodene Nam Head Wo Contrast  Result Date: 06/19/2018 CLINICAL DATA:  TIA.  Initial exam EXAM: MRI HEAD WITHOUT CONTRAST MRA HEAD WITHOUT CONTRAST TECHNIQUE: Multiplanar, multiecho pulse sequences of the brain and surrounding structures were obtained without intravenous contrast. Angiographic images of the head were obtained using MRA technique  without contrast.  COMPARISON:  03/29/2006 brain MRI head CT and CTA from yesterday FINDINGS: MRI HEAD FINDINGS Brain: 4 small acute cortical infarcts in the right frontal lobe extending from the operculum towards the vertex, MCA distribution. The largest measures up to 13 mm in length. No hemorrhage, hydrocephalus, or pre-existing infarct. No masslike finding or collection. Vascular: Arterial findings below. Normal dural venous sinus flow voids Skull and upper cervical spine: Normal marrow signal Sinuses/Orbits: Negative Other: Progressively motion degraded. MRA HEAD FINDINGS The ICAs are tortuous at the skull base. Fetal type right PCA no branch occlusion, beading, or aneurysm. Negative for stenosis. IMPRESSION: Brain MRI: 1. Small patchy acute infarcts along the right frontal lobe, MCA territory. 2. Elsewhere normal appearance of the brain. 3. Progressively motion degraded study. Intracranial MRA: Negative. Electronically Signed   By: Monte Fantasia M.D.   On: 06/19/2018 11:41   Mr Brain Wo Contrast  Result Date: 06/19/2018 CLINICAL DATA:  TIA.  Initial exam EXAM: MRI HEAD WITHOUT CONTRAST MRA HEAD WITHOUT CONTRAST TECHNIQUE: Multiplanar, multiecho pulse sequences of the brain and surrounding structures were obtained without intravenous contrast. Angiographic images of the head were obtained using MRA technique without contrast. COMPARISON:  03/29/2006 brain MRI head CT and CTA from yesterday FINDINGS: MRI HEAD FINDINGS Brain: 4 small acute cortical infarcts in the right frontal lobe extending from the operculum towards the vertex, MCA distribution. The largest measures up to 13 mm in length. No hemorrhage, hydrocephalus, or pre-existing infarct. No masslike finding or collection. Vascular: Arterial findings below. Normal dural venous sinus flow voids Skull and upper cervical spine: Normal marrow signal Sinuses/Orbits: Negative Other: Progressively motion degraded. MRA HEAD FINDINGS The ICAs are tortuous at  the skull base. Fetal type right PCA no branch occlusion, beading, or aneurysm. Negative for stenosis. IMPRESSION: Brain MRI: 1. Small patchy acute infarcts along the right frontal lobe, MCA territory. 2. Elsewhere normal appearance of the brain. 3. Progressively motion degraded study. Intracranial MRA: Negative. Electronically Signed   By: Monte Fantasia M.D.   On: 06/19/2018 11:41    Microbiology: No results found for this or any previous visit (from the past 240 hour(s)).   Labs: Basic Metabolic Panel: Recent Labs  Lab 06/18/18 1822 06/18/18 1830 06/19/18 0449  NA 140 142 142  K 4.0 4.0 3.9  CL 107 106 108  CO2 24  --  27  GLUCOSE 94 92 94  BUN 27* 27* 22*  CREATININE 1.03 1.10 0.92  CALCIUM 9.4  --  8.9  MG 2.2  --   --    Liver Function Tests: Recent Labs  Lab 06/18/18 1822  AST 18  ALT 20  ALKPHOS 67  BILITOT 0.7  PROT 7.2  ALBUMIN 4.1   No results for input(s): LIPASE, AMYLASE in the last 168 hours. No results for input(s): AMMONIA in the last 168 hours. CBC: Recent Labs  Lab 06/18/18 1822 06/18/18 1830 06/19/18 0449  WBC 8.9  --  7.7  NEUTROABS 5.8  --   --   HGB 15.7 15.3 15.3  HCT 46.2 45.0 45.6  MCV 91.7  --  92.1  PLT 189  --  174   Cardiac Enzymes: No results for input(s): CKTOTAL, CKMB, CKMBINDEX, TROPONINI in the last 168 hours. BNP: BNP (last 3 results) No results for input(s): BNP in the last 8760 hours.  ProBNP (last 3 results) No results for input(s): PROBNP in the last 8760 hours.  CBG: Recent Labs  Lab 06/18/18 1817  GLUCAP 103*  Signed:  Lelon Frohlich  [facility] Pager: [phone] 06/20/2018, 12:34 PM

## 2018-06-20 NOTE — Procedures (Signed)
  [place] A. Merlene Laughter, MD     [email or web address]           HISTORY: 62 YO who presents with AMS. The study is being to evaluate for seizures as the etiology.   MEDICATIONS: Scheduled Meds: Continuous Infusions: PRN Meds:.  Prior to Admission medications   Medication Sig Start Date End Date Taking? Authorizing Provider  allopurinol (ZYLOPRIM) 300 MG tablet Take 150 mg by mouth daily. 11/14/16   [provider]  aspirin 325 MG tablet Take 1 tablet (325 mg total) by mouth daily. 06/21/18   Isaac Bliss, Rayford Halsted, MD  atorvastatin (LIPITOR) 80 MG tablet Take 1 tablet (80 mg total) by mouth daily at 6 PM. 06/20/18   Isaac Bliss, Rayford Halsted, MD  diphenhydramine-acetaminophen (TYLENOL PM) 25-500 MG TABS tablet Take 1 tablet by mouth at bedtime as needed.    [provider]  omeprazole (PRILOSEC OTC) 20 MG tablet Take 20 mg by mouth every 3 (three) days.     [provider]  vitamin B-12 (CYANOCOBALAMIN) 1000 MCG tablet Take 1,000 mcg by mouth daily.    [provider]  vitamin C (ASCORBIC ACID) 500 MG tablet Take 500 mg by mouth daily.    [provider]      ANALYSIS: A 16 channel recording using standard 10 20 measurements is conducted for 22 minutes.  There is a well-formed posterior dominant rhythm of 11 Hz which attenuates with eye-opening.  There is beta activity observed in the frontal areas.  Awake and drowsy activities are observed.  There is some evidence of rudimentary spindles and K complexes.  Photic stimulation and hyperventilation are not conducted.  There is no focal or lateralized slowing.  There is no epileptiform activity is observed.   IMPRESSION: 1. This is a normal recording of the awake and drowsy states.      Aviv Rota A. Merlene Laughter, M.D.  Diplomate, Tax adviser of Psychiatry and Neurology ( Neurology).

## 2018-06-20 NOTE — Progress Notes (Signed)
EEG completed, results pending. 

## 2018-06-20 NOTE — Progress Notes (Signed)
Patient is to be discharged home and in stable condition. Patient's IV and telemetry removed, WNL. Patient given discharge instructions and verbalized understanding. Patient denies the need for a wheelchair escort out.   Celestia Khat, RN

## 2018-06-28 DIAGNOSIS — E782 Mixed hyperlipidemia: Secondary | ICD-10-CM | POA: Diagnosis not present

## 2018-06-28 DIAGNOSIS — M109 Gout, unspecified: Secondary | ICD-10-CM | POA: Diagnosis not present

## 2018-06-28 DIAGNOSIS — K219 Gastro-esophageal reflux disease without esophagitis: Secondary | ICD-10-CM | POA: Diagnosis not present

## 2018-06-28 DIAGNOSIS — R531 Weakness: Secondary | ICD-10-CM | POA: Diagnosis not present

## 2018-06-29 ENCOUNTER — Ambulatory Visit (INDEPENDENT_AMBULATORY_CARE_PROVIDER_SITE_OTHER): Payer: BLUE CROSS/BLUE SHIELD

## 2018-06-29 ENCOUNTER — Other Ambulatory Visit: Payer: Self-pay

## 2018-06-29 DIAGNOSIS — I639 Cerebral infarction, unspecified: Secondary | ICD-10-CM | POA: Diagnosis not present

## 2018-06-29 DIAGNOSIS — G464 Cerebellar stroke syndrome: Secondary | ICD-10-CM | POA: Diagnosis not present

## 2018-07-11 DIAGNOSIS — E669 Obesity, unspecified: Secondary | ICD-10-CM | POA: Diagnosis not present

## 2018-07-11 DIAGNOSIS — R6882 Decreased libido: Secondary | ICD-10-CM | POA: Diagnosis not present

## 2018-07-11 DIAGNOSIS — I10 Essential (primary) hypertension: Secondary | ICD-10-CM | POA: Diagnosis not present

## 2018-07-11 DIAGNOSIS — E559 Vitamin D deficiency, unspecified: Secondary | ICD-10-CM | POA: Diagnosis not present

## 2018-09-10 DIAGNOSIS — R6882 Decreased libido: Secondary | ICD-10-CM | POA: Diagnosis not present

## 2018-09-10 DIAGNOSIS — E669 Obesity, unspecified: Secondary | ICD-10-CM | POA: Diagnosis not present

## 2018-09-10 DIAGNOSIS — I10 Essential (primary) hypertension: Secondary | ICD-10-CM | POA: Diagnosis not present

## 2018-09-13 DIAGNOSIS — R6882 Decreased libido: Secondary | ICD-10-CM | POA: Diagnosis not present

## 2018-10-25 DIAGNOSIS — I10 Essential (primary) hypertension: Secondary | ICD-10-CM | POA: Diagnosis not present

## 2018-10-25 DIAGNOSIS — E785 Hyperlipidemia, unspecified: Secondary | ICD-10-CM | POA: Diagnosis not present

## 2018-10-25 DIAGNOSIS — Z23 Encounter for immunization: Secondary | ICD-10-CM | POA: Diagnosis not present

## 2018-10-25 DIAGNOSIS — E669 Obesity, unspecified: Secondary | ICD-10-CM | POA: Diagnosis not present

## 2018-10-25 DIAGNOSIS — R6882 Decreased libido: Secondary | ICD-10-CM | POA: Diagnosis not present

## 2018-10-25 DIAGNOSIS — E559 Vitamin D deficiency, unspecified: Secondary | ICD-10-CM | POA: Diagnosis not present

## 2019-01-24 DIAGNOSIS — G441 Vascular headache, not elsewhere classified: Secondary | ICD-10-CM | POA: Diagnosis not present

## 2019-01-24 DIAGNOSIS — E669 Obesity, unspecified: Secondary | ICD-10-CM | POA: Diagnosis not present

## 2019-01-24 DIAGNOSIS — I10 Essential (primary) hypertension: Secondary | ICD-10-CM | POA: Diagnosis not present

## 2019-01-28 DIAGNOSIS — Z713 Dietary counseling and surveillance: Secondary | ICD-10-CM | POA: Diagnosis not present

## 2019-01-28 DIAGNOSIS — E559 Vitamin D deficiency, unspecified: Secondary | ICD-10-CM | POA: Diagnosis not present

## 2019-01-28 DIAGNOSIS — G441 Vascular headache, not elsewhere classified: Secondary | ICD-10-CM | POA: Diagnosis not present

## 2019-01-28 DIAGNOSIS — I10 Essential (primary) hypertension: Secondary | ICD-10-CM | POA: Diagnosis not present

## 2019-01-28 DIAGNOSIS — E785 Hyperlipidemia, unspecified: Secondary | ICD-10-CM | POA: Diagnosis not present

## 2019-01-28 DIAGNOSIS — R6882 Decreased libido: Secondary | ICD-10-CM | POA: Diagnosis not present

## 2019-02-04 ENCOUNTER — Other Ambulatory Visit: Payer: Self-pay | Admitting: Internal Medicine

## 2019-02-04 ENCOUNTER — Other Ambulatory Visit: Payer: Self-pay | Admitting: Hematology and Oncology

## 2019-02-04 DIAGNOSIS — G441 Vascular headache, not elsewhere classified: Secondary | ICD-10-CM

## 2019-02-11 ENCOUNTER — Ambulatory Visit (HOSPITAL_COMMUNITY)
Admission: RE | Admit: 2019-02-11 | Discharge: 2019-02-11 | Disposition: A | Discharge status: Home / Self Care | Payer: BLUE CROSS/BLUE SHIELD | Source: Ambulatory Visit | Attending: Internal Medicine | Admitting: Internal Medicine

## 2019-02-11 DIAGNOSIS — R42 Dizziness and giddiness: Secondary | ICD-10-CM | POA: Diagnosis not present

## 2019-02-11 DIAGNOSIS — G441 Vascular headache, not elsewhere classified: Secondary | ICD-10-CM | POA: Insufficient documentation

## 2019-02-12 DIAGNOSIS — R509 Fever, unspecified: Secondary | ICD-10-CM | POA: Diagnosis not present

## 2019-02-12 DIAGNOSIS — J029 Acute pharyngitis, unspecified: Secondary | ICD-10-CM | POA: Diagnosis not present

## 2019-02-12 DIAGNOSIS — J069 Acute upper respiratory infection, unspecified: Secondary | ICD-10-CM | POA: Diagnosis not present

## 2019-02-12 DIAGNOSIS — M7711 Lateral epicondylitis, right elbow: Secondary | ICD-10-CM | POA: Diagnosis not present

## 2019-04-30 ENCOUNTER — Ambulatory Visit (INDEPENDENT_AMBULATORY_CARE_PROVIDER_SITE_OTHER): Payer: BLUE CROSS/BLUE SHIELD | Admitting: Internal Medicine

## 2019-04-30 DIAGNOSIS — E785 Hyperlipidemia, unspecified: Secondary | ICD-10-CM | POA: Diagnosis not present

## 2019-04-30 DIAGNOSIS — R6882 Decreased libido: Secondary | ICD-10-CM | POA: Diagnosis not present

## 2019-04-30 DIAGNOSIS — I10 Essential (primary) hypertension: Secondary | ICD-10-CM | POA: Diagnosis not present

## 2019-04-30 DIAGNOSIS — E669 Obesity, unspecified: Secondary | ICD-10-CM | POA: Diagnosis not present

## 2019-05-31 ENCOUNTER — Ambulatory Visit
Admission: EM | Admit: 2019-05-31 | Discharge: 2019-05-31 | Disposition: A | Discharge status: Home / Self Care | Payer: BC Managed Care – PPO | Attending: Emergency Medicine | Admitting: Emergency Medicine

## 2019-05-31 ENCOUNTER — Other Ambulatory Visit: Payer: Self-pay

## 2019-05-31 DIAGNOSIS — M5441 Lumbago with sciatica, right side: Secondary | ICD-10-CM | POA: Diagnosis not present

## 2019-05-31 DIAGNOSIS — R001 Bradycardia, unspecified: Secondary | ICD-10-CM

## 2019-05-31 MED ORDER — CYCLOBENZAPRINE HCL 10 MG PO TABS: 10.0000 mg | ORAL_TABLET | Freq: Every day | ORAL | 0 refills | Status: DC

## 2019-05-31 MED ORDER — ACETAMINOPHEN 500 MG PO TABS: 500.0000 mg | ORAL_TABLET | Freq: Four times a day (QID) | ORAL | 0 refills | Status: DC | PRN

## 2019-05-31 MED ORDER — METHYLPREDNISOLONE SODIUM SUCC 125 MG IJ SOLR
80.0000 mg | Freq: Once | INTRAMUSCULAR | Status: AC
  Administered 2019-05-31: 80 mg via INTRAMUSCULAR

## 2019-05-31 NOTE — ED Provider Notes (Signed)
[place]   [id number] 05/31/19 Arrival Time: 6222  CC: Back PAIN  SUBJECTIVE: History from: patient. Jeffrey Allen is a 63 y.o. male hx significant for TIA, GERD, gout, and PUD with H/O perforated ulcer, complains of back pain that began 6 days ago.  States he was riding his tractor with his grandchildren and felt right sided low back pain.  The pain was exacerbated after picking tobacco, 1-2 days ago.  Since then his pain has improved.  States it is constant 2-3/10 now, but worse with certain movements like bending forward or twisting.   Localizes the pain to the RT low back.  Has tried heating pad with minimal relief.  Denies similar symptoms in the past.  Complains of associated intermittent radiating symptoms into right leg.  Denies fever, chills, erythema, ecchymosis, effusion, weakness, numbness and tingling, saddle paresthesias, loss of bowel or bladder function.      Also mentions hx of irregular heartbeat.  Dr. Anastasio Champion is his PCP, last visit was about a month ago.  Pt states Dr. Anastasio Champion is aware of his irregular heartbeat.  Records not available in epic for review.    ROS: As per HPI.  Past Medical History:  Diagnosis Date  . GERD (gastroesophageal reflux disease)   . Gout   . Peptic ulcer disease    Past Surgical History:  Procedure Laterality Date  . COLONOSCOPY    . COLONOSCOPY N/A 12/07/2016   Procedure: COLONOSCOPY;  Surgeon: Daneil Dolin, MD;  Location: [facility];  Service: Endoscopy;  Laterality: N/A;  9:15 AM   Allergies  Allergen Reactions  . Prednisone Other (See Comments)    Peptic ulcers     No current facility-administered medications on file prior to encounter.    Current Outpatient Medications on File Prior to Encounter  Medication Sig Dispense Refill  . allopurinol (ZYLOPRIM) 300 MG tablet Take 150 mg by mouth daily.  2  . aspirin 325 MG tablet Take 1 tablet (325 mg total) by mouth daily.    Marland Kitchen atorvastatin (LIPITOR) 80 MG tablet  Take 1 tablet (80 mg total) by mouth daily at 6 PM. 30 tablet 2  . diphenhydramine-acetaminophen (TYLENOL PM) 25-500 MG TABS tablet Take 1 tablet by mouth at bedtime as needed.    Marland Kitchen omeprazole (PRILOSEC OTC) 20 MG tablet Take 20 mg by mouth every 3 (three) days.     . vitamin B-12 (CYANOCOBALAMIN) 1000 MCG tablet Take 1,000 mcg by mouth daily.    . vitamin C (ASCORBIC ACID) 500 MG tablet Take 500 mg by mouth daily.     Social History   Socioeconomic History  . Marital status: Married    Spouse name: Not on file  . Number of children: Not on file  . Years of education: Not on file  . Highest education level: Not on file  Occupational History  . Not on file  Social Needs  . Financial resource strain: Not on file  . Food insecurity:    Worry: Not on file    Inability: Not on file  . Transportation needs:    Medical: Not on file    Non-medical: Not on file  Tobacco Use  . Smoking status: Former Smoker    Packs/day: 0.02    Years: 25.00    Pack years: 0.50    Types: Cigarettes    Last attempt to quit: 12/26/2006    Years since quitting: 12.4  . Smokeless tobacco: Never Used  Substance and Sexual  Activity  . Alcohol use: Yes    Alcohol/week: 14.0 standard drinks    Types: 14 Cans of beer per week  . Drug use: No  . Sexual activity: Not on file  Lifestyle  . Physical activity:    Days per week: Not on file    Minutes per session: Not on file  . Stress: Not on file  Relationships  . Social connections:    Talks on phone: Not on file    Gets together: Not on file    Attends religious service: Not on file    Active member of club or organization: Not on file    Attends meetings of clubs or organizations: Not on file    Relationship status: Not on file  . Intimate partner violence:    Fear of current or ex partner: Not on file    Emotionally abused: Not on file    Physically abused: Not on file    Forced sexual activity: Not on file  Other Topics Concern  . Not on file   Social History Narrative  . Not on file   Family History  Problem Relation Age of Onset  . Colon cancer Neg Hx     OBJECTIVE:  Vitals:   05/31/19 1049  BP: (!) 153/94  Pulse: (!) 55  Resp: 18  Temp: 98.4 F (36.9 C)  SpO2: 98%    General appearance: ALERT; in no acute distress.  Head: NCAT Lungs: Normal respiratory effort; CTAB CV: Irregularly irregular rhythm Musculoskeletal: Back  Inspection: Skin warm, dry, clear and intact without obvious erythema, effusion, or ecchymosis.  Palpation: TTP over RT low back paravertebral muscles ROM: LROM about the back Strength: 5/5 hip flexion, RT hip flexion with discomfort, 5/5 knee abduction, 5/5 knee adduction, 5/5 knee flexion, 5/5 knee extension, 5/5 dorsiflexion, 5/5 plantar flexion DTR: patella DTF intact 2+ Skin: warm and dry Neurologic: Ambulates without difficulty; Sensation intact about the lower extremities Psychological: alert and cooperative; normal mood and affect  ASSESSMENT & PLAN:  1. Acute right-sided low back pain with right-sided sciatica   2. Bradycardia     Meds ordered this encounter  Medications  . methylPREDNISolone sodium succinate (SOLU-MEDROL) 125 mg/2 mL injection 80 mg  . acetaminophen (TYLENOL) 500 MG tablet    Sig: Take 1 tablet (500 mg total) by mouth every 6 (six) hours as needed.    Dispense:  30 tablet    Refill:  0    Order Specific Question:   Supervising Provider    Answer:   Raylene Everts [[id number]]  . cyclobenzaprine (FLEXERIL) 10 MG tablet    Sig: Take 1 tablet (10 mg total) by mouth at bedtime.    Dispense:  15 tablet    Refill:  0    Order Specific Question:   Supervising Provider    Answer:   Raylene Everts [[id number]]   Steroid shot given in office Continue conservative management of rest, ice, heat, and gentle stretches Avoid painful activities Take tylenol extra strength as prescribed for pain Take cyclobenzaprine at nighttime for symptomatic relief. Avoid driving  or operating heavy machinery while using medication. Follow up with PCP next week for recheck for back pain and low heart rate Return or go to the ER if you have any new or worsening symptoms (fever, chills, chest pain, abdominal pain, changes in bowel or bladder habits, loss of bowel or bladder function, pain radiating into lower legs, etc...)   Reviewed expectations re: course of current  medical issues. Questions answered. Outlined signs and symptoms indicating need for more acute intervention. Patient verbalized understanding. After Visit Summary given.    Lestine Box, PA-C 05/31/19 1135

## 2019-05-31 NOTE — ED Triage Notes (Signed)
Pt states he had back pain after mowing then after day in tobacco field, had sudden severe pain that radiates to right leg

## 2019-05-31 NOTE — Discharge Instructions (Addendum)
Steroid shot given in office Continue conservative management of rest, ice, heat, and gentle stretches Avoid painful activities Take tylenol extra strength as prescribed for pain Take cyclobenzaprine at nighttime for symptomatic relief. Avoid driving or operating heavy machinery while using medication. Follow up with PCP next week for recheck for back pain and low heart rate Return or go to the ER if you have any new or worsening symptoms (fever, chills, chest pain, abdominal pain, changes in bowel or bladder habits, loss of bowel or bladder function, pain radiating into lower legs, etc...)

## 2019-08-01 DIAGNOSIS — E559 Vitamin D deficiency, unspecified: Secondary | ICD-10-CM | POA: Diagnosis not present

## 2019-08-01 DIAGNOSIS — R7302 Impaired glucose tolerance (oral): Secondary | ICD-10-CM | POA: Diagnosis not present

## 2019-08-01 DIAGNOSIS — E785 Hyperlipidemia, unspecified: Secondary | ICD-10-CM | POA: Diagnosis not present

## 2019-08-01 DIAGNOSIS — E669 Obesity, unspecified: Secondary | ICD-10-CM | POA: Diagnosis not present

## 2019-08-01 DIAGNOSIS — M720 Palmar fascial fibromatosis [Dupuytren]: Secondary | ICD-10-CM | POA: Diagnosis not present

## 2019-08-01 DIAGNOSIS — Z0000 Encounter for general adult medical examination without abnormal findings: Secondary | ICD-10-CM | POA: Diagnosis not present

## 2019-08-01 DIAGNOSIS — I10 Essential (primary) hypertension: Secondary | ICD-10-CM | POA: Diagnosis not present

## 2019-08-01 DIAGNOSIS — Z125 Encounter for screening for malignant neoplasm of prostate: Secondary | ICD-10-CM | POA: Diagnosis not present

## 2019-08-01 DIAGNOSIS — R5383 Other fatigue: Secondary | ICD-10-CM | POA: Diagnosis not present

## 2019-08-26 ENCOUNTER — Encounter: Payer: Self-pay | Admitting: Orthopedic Surgery

## 2019-08-26 ENCOUNTER — Other Ambulatory Visit: Payer: Self-pay

## 2019-08-26 ENCOUNTER — Ambulatory Visit (INDEPENDENT_AMBULATORY_CARE_PROVIDER_SITE_OTHER): Payer: BC Managed Care – PPO | Admitting: Orthopedic Surgery

## 2019-08-26 VITALS — BP 154/69 | HR 64 | Temp 97.6°F | Ht 67.0 in | Wt 205.0 lb

## 2019-08-26 DIAGNOSIS — M720 Palmar fascial fibromatosis [Dupuytren]: Secondary | ICD-10-CM

## 2019-08-26 NOTE — Progress Notes (Signed)
Jeffrey Allen  08/26/2019  HISTORY SECTION :  Chief Complaint  Patient presents with  . Hand Problem    bilateral palms    HPI The patient presents for evaluation of evaluation of cordlike structures in the palm of both hands.  History of carpal tunnel syndrome which did not require surgery was treated nonoperatively successfully  He has pain in the left hand in the palm near the Dupuytren's contracture of the ring finger left palm no such symptoms on the right  Review of Systems  All other systems reviewed and are negative.    Past Medical History:  Diagnosis Date  . GERD (gastroesophageal reflux disease)   . Gout   . Peptic ulcer disease     Past Surgical History:  Procedure Laterality Date  . COLONOSCOPY    . COLONOSCOPY N/A 12/07/2016   Procedure: COLONOSCOPY;  Surgeon: Daneil Dolin, MD;  Location: [facility];  Service: Endoscopy;  Laterality: N/A;  9:15 AM     Allergies  Allergen Reactions  . Prednisone Other (See Comments)    Peptic ulcers       Current Outpatient Medications:  .  acetaminophen (TYLENOL) 500 MG tablet, Take 1 tablet (500 mg total) by mouth every 6 (six) hours as needed. (Patient not taking: Reported on 08/26/2019), Disp: 30 tablet, Rfl: 0 .  allopurinol (ZYLOPRIM) 300 MG tablet, Take 150 mg by mouth daily., Disp: , Rfl: 2 .  aspirin 325 MG tablet, Take 1 tablet (325 mg total) by mouth daily. (Patient not taking: Reported on 08/26/2019), Disp: , Rfl:  .  atorvastatin (LIPITOR) 80 MG tablet, Take 1 tablet (80 mg total) by mouth daily at 6 PM. (Patient not taking: Reported on 08/26/2019), Disp: 30 tablet, Rfl: 2 .  cyclobenzaprine (FLEXERIL) 10 MG tablet, Take 1 tablet (10 mg total) by mouth at bedtime. (Patient not taking: Reported on 08/26/2019), Disp: 15 tablet, Rfl: 0 .  diphenhydramine-acetaminophen (TYLENOL PM) 25-500 MG TABS tablet, Take 1 tablet by mouth at bedtime as needed., Disp: , Rfl:  .  omeprazole (PRILOSEC OTC) 20 MG tablet,  Take 20 mg by mouth every 3 (three) days. , Disp: , Rfl:  .  vitamin B-12 (CYANOCOBALAMIN) 1000 MCG tablet, Take 1,000 mcg by mouth daily., Disp: , Rfl:  .  vitamin C (ASCORBIC ACID) 500 MG tablet, Take 500 mg by mouth daily., Disp: , Rfl:    PHYSICAL EXAM SECTION: 1) BP (!) 154/69   Pulse 64   Ht 5\' 7"  (1.702 m)   Wt 205 lb (93 kg)   BMI 32.11 kg/m   Body mass index is 32.11 kg/m. General appearance: Well-developed well-nourished no gross deformities  2) Cardiovascular normal pulse and perfusion in the upper extremities normal color without edema  3) Neurologically deep tendon reflexes are equal and normal, no sensation loss or deficits no pathologic reflexes  4) Psychological: Awake alert and oriented x3 mood and affect normal  5) Skin no lacerations or ulcerations no nodularity no palpable masses, no erythema or nodularity  6) Musculoskeletal: Palm right hand left hand primarily the ring finger cordlike structures consistent with Dupuytren's no contractures or loss of motion he does have some nodules consistent with osteoarthritis of the interphalangeal joints  MEDICAL DECISION SECTION:  Encounter Diagnosis  Name Primary?  . Dupuytren's contracture of both hands Yes    Imaging None  Plan:  (Rx., Inj., surg., Frx, MRI/CT, XR:2) Patient will be referred to hand surgery for further management   2:52  PM Arther Abbott, MD  08/26/2019

## 2019-09-11 DIAGNOSIS — M79641 Pain in right hand: Secondary | ICD-10-CM | POA: Diagnosis not present

## 2019-09-11 DIAGNOSIS — G5621 Lesion of ulnar nerve, right upper limb: Secondary | ICD-10-CM | POA: Diagnosis not present

## 2019-09-11 DIAGNOSIS — G5603 Carpal tunnel syndrome, bilateral upper limbs: Secondary | ICD-10-CM | POA: Diagnosis not present

## 2019-09-11 DIAGNOSIS — G5622 Lesion of ulnar nerve, left upper limb: Secondary | ICD-10-CM | POA: Diagnosis not present

## 2019-09-11 DIAGNOSIS — M79642 Pain in left hand: Secondary | ICD-10-CM | POA: Diagnosis not present

## 2019-10-09 DIAGNOSIS — G5602 Carpal tunnel syndrome, left upper limb: Secondary | ICD-10-CM | POA: Diagnosis not present

## 2019-10-09 DIAGNOSIS — M79641 Pain in right hand: Secondary | ICD-10-CM | POA: Diagnosis not present

## 2019-10-09 DIAGNOSIS — R52 Pain, unspecified: Secondary | ICD-10-CM | POA: Diagnosis not present

## 2019-10-09 DIAGNOSIS — G5603 Carpal tunnel syndrome, bilateral upper limbs: Secondary | ICD-10-CM | POA: Diagnosis not present

## 2019-10-09 DIAGNOSIS — M720 Palmar fascial fibromatosis [Dupuytren]: Secondary | ICD-10-CM | POA: Diagnosis not present

## 2019-11-07 ENCOUNTER — Other Ambulatory Visit: Payer: Self-pay

## 2019-11-07 ENCOUNTER — Ambulatory Visit (INDEPENDENT_AMBULATORY_CARE_PROVIDER_SITE_OTHER): Payer: BC Managed Care – PPO | Admitting: Internal Medicine

## 2019-11-07 ENCOUNTER — Encounter (INDEPENDENT_AMBULATORY_CARE_PROVIDER_SITE_OTHER): Payer: Self-pay | Admitting: Internal Medicine

## 2019-11-07 VITALS — BP 128/70 | HR 78 | Temp 97.5°F | Resp 18 | Ht 67.0 in | Wt 209.0 lb

## 2019-11-07 DIAGNOSIS — E559 Vitamin D deficiency, unspecified: Secondary | ICD-10-CM | POA: Diagnosis not present

## 2019-11-07 DIAGNOSIS — E66811 Obesity, class 1: Secondary | ICD-10-CM

## 2019-11-07 DIAGNOSIS — M1A0790 Idiopathic chronic gout, unspecified ankle and foot, without tophus (tophi): Secondary | ICD-10-CM | POA: Diagnosis not present

## 2019-11-07 DIAGNOSIS — Z23 Encounter for immunization: Secondary | ICD-10-CM

## 2019-11-07 DIAGNOSIS — E291 Testicular hypofunction: Secondary | ICD-10-CM | POA: Insufficient documentation

## 2019-11-07 DIAGNOSIS — Z1159 Encounter for screening for other viral diseases: Secondary | ICD-10-CM | POA: Diagnosis not present

## 2019-11-07 DIAGNOSIS — R7303 Prediabetes: Secondary | ICD-10-CM | POA: Diagnosis not present

## 2019-11-07 DIAGNOSIS — E782 Mixed hyperlipidemia: Secondary | ICD-10-CM

## 2019-11-07 DIAGNOSIS — E669 Obesity, unspecified: Secondary | ICD-10-CM

## 2019-11-07 DIAGNOSIS — I639 Cerebral infarction, unspecified: Secondary | ICD-10-CM

## 2019-11-07 DIAGNOSIS — I10 Essential (primary) hypertension: Secondary | ICD-10-CM | POA: Diagnosis not present

## 2019-11-07 HISTORY — DX: Prediabetes: R73.03

## 2019-11-07 HISTORY — DX: Obesity, class 1: E66.811

## 2019-11-07 HISTORY — DX: Testicular hypofunction: E29.1

## 2019-11-07 HISTORY — DX: Obesity, unspecified: E66.9

## 2019-11-07 NOTE — Patient Instructions (Signed)
[facility] Dietary Recommendations for Weight Loss What to Avoid . Avoid added sugars o Often added sugar can be found in processed foods such as many condiments, dry cereals, cakes, cookies, chips, crisps, crackers, candies, sweetened drinks, etc.  o Read labels and AVOID/DECREASE use of foods with the following in their ingredient list: Sugar, fructose, high fructose corn syrup, sucrose, glucose, maltose, dextrose, molasses, cane sugar, brown sugar, any type of syrup, agave nectar, etc.   . Avoid snacking in between meals . Avoid foods made with flour o If you are going to eat food made with flour, choose those made with whole-grains; and, minimize your consumption as much as is tolerable . Avoid processed foods o These foods are generally stocked in the middle of the grocery store. Focus on shopping on the perimeter of the grocery.  What to Include . Vegetables o GREEN LEAFY VEGETABLES: Kale, spinach, mustard greens, collard greens, cabbage, broccoli, etc. o OTHER: Asparagus, cauliflower, eggplant, carrots, peas, Brussel sprouts, tomatoes, bell peppers, zucchini, beets, cucumbers, etc. . Grains, seeds, and legumes o Beans: kidney beans, black eyed peas, garbanzo beans, black beans, pinto beans, etc. o Whole, unrefined grains: brown rice, barley, bulgur, oatmeal, etc. . Healthy fats  o Avoid highly processed fats such as vegetable oil o Examples of healthy fats: avocado, olives, virgin olive oil, dark chocolate (?72% Cocoa), nuts (peanuts, almonds, walnuts, cashews, pecans, etc.) . Low - Moderate Intake of Animal Sources of Protein o Meat sources: chicken, turkey, salmon, tuna. Limit to 4 ounces of meat at one time. o Consider limiting dairy sources, but when choosing dairy focus on: PLAIN Greek yogurt, cottage cheese, high-protein milk . Fruit o Choose berries  When to Eat . Intermittent Fasting: o Choosing not to eat for a specific time period, but DO FOCUS ON HYDRATION  when fasting o Multiple Techniques: - Time Restricted Eating: eat 3 meals in a day, each meal lasting no more than 60 minutes, no snacks between meals - 16-18 hour fast: fast for 16 to 18 hours up to 7 days a week. Often suggested to start with 2-3 nonconsecutive days per week.  . Remember the time you sleep is counted as fasting.  . Examples of eating schedule: Fast from 7:00pm-11:00am. Eat between 11:00am-7:00pm.  - 24-hour fast: fast for 24 hours up to every other day. Often suggested to start with 1 day per week . Remember the time you sleep is counted as fasting . Examples of eating schedule:  o Eating day: eat 2-3 meals on your eating day. If doing 2 meals, each meal should last no more than 90 minutes. If doing 3 meals, each meal should last no more than 60 minutes. Finish last meal by 7:00pm. o Fasting day: Fast until 7:00pm.  o IF YOU FEEL UNWELL FOR ANY REASON/IN ANY WAY WHEN FASTING, STOP FASTING BY EATING A NUTRITIOUS SNACK OR LIGHT MEAL o ALWAYS FOCUS ON HYDRATION DURING FASTS - Acceptable Hydration sources: water, broths, tea/coffee (black tea/coffee is best but using a small amount of whole-fat dairy products in coffee/tea is acceptable).  - Poor Hydration Sources: anything with sugar or artificial sweeteners added to it  These recommendations have been developed for patients that are actively receiving medical care from either Dr.  or Jeffrey Gray, DNP, NP-C at  [facility]. These recommendations are developed for patients with specific medical conditions and are not meant to be distributed or used by others that are not actively receiving care from either provider listed   above at  [facility]. It is not appropriate to participate in the above eating plans without proper medical supervision.   Reference: Fung, J. The obesity code. [place]: [place]; 2016.   

## 2019-11-07 NOTE — Progress Notes (Signed)
Metrics: Intervention Frequency ACO  Documented Smoking Status Yearly  Screened one or more times in 24 months  Cessation Counseling or  Active cessation medication Past 24 months  Past 24 months   Guideline developer: UpToDate (See UpToDate for funding source) Date Released: 2014       Wellness Office Visit  Subjective:  Patient ID: Jeffrey Allen, male    DOB: [birth year]  Age: 63 y.o. MRN: [id number]  CC: This man comes in for follow-up regarding his obesity, gout, prediabetes, hypogonadism.  He also has vitamin D deficiency. HPI  He has been doing reasonably well and now he is more compliant with taking testosterone therapy once a week.  He does feel better when he does take it. He continues with vitamin D3 supplementation at 10,000 units daily. Although he has significant hyperlipidemia and has had a stroke, unfortunately, he can really not tolerate statin therapy whatsoever, which is unfortunate in his case because of the history of stroke. Past Medical History:  Diagnosis Date  . GERD (gastroesophageal reflux disease)   . Gout   . Obesity (BMI 30.0-34.9) 11/07/2019  . Peptic ulcer disease   . Prediabetes 11/07/2019  . Stroke ([place])   . Testicular failure 11/07/2019      Family History  Problem Relation Age of Onset  . Diabetes Father   . 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   Social History   Tobacco Use  . Smoking status: Former Smoker    Packs/day: 0.02    Years: 25.00    Pack years: 0.50    Types: Cigarettes    Quit date: 12/26/2006    Years since quitting: 12.8  . Smokeless tobacco: Never Used  Substance Use Topics  . Alcohol use: Yes    Alcohol/week: 14.0 standard drinks    Types: 14 Cans of beer per week    Current Meds  Medication Sig  . allopurinol (ZYLOPRIM) 300 MG tablet Take 150 mg by mouth daily.  . Cholecalciferol (VITAMIN D3) 125 MCG (5000 UT)  TABS Take 2 tablets by mouth daily.  . Testosterone Cypionate 200 MG/ML SOLN Inject 100 mg as directed once a week.  . [DISCONTINUED] acetaminophen (TYLENOL) 500 MG tablet Take 1 tablet (500 mg total) by mouth every 6 (six) hours as needed.  . [DISCONTINUED] aspirin 325 MG tablet Take 1 tablet (325 mg total) by mouth daily.  . [DISCONTINUED] cyclobenzaprine (FLEXERIL) 10 MG tablet Take 1 tablet (10 mg total) by mouth at bedtime.  . [DISCONTINUED] diphenhydramine-acetaminophen (TYLENOL PM) 25-500 MG TABS tablet Take 1 tablet by mouth at bedtime as needed.  . [DISCONTINUED] omeprazole (PRILOSEC OTC) 20 MG tablet Take 20 mg by mouth every 3 (three) days.   . [DISCONTINUED] vitamin B-12 (CYANOCOBALAMIN) 1000 MCG tablet Take 1,000 mcg by mouth daily.  . [DISCONTINUED] vitamin C (ASCORBIC ACID) 500 MG tablet Take 500 mg by mouth daily.     Bio Identical Hormones  Testosterone therapy is being used off label for symptoms of testosterone deficiency and benefits that it produces based on several studies.  These benefits include decreasing body fat, increasing in lean muscle mass and increasing in bone density.  There is improvement of memory, cognition.  There is improvement in exercise tolerance and endurance.  Testosterone therapy has also been shown to be protective against coronary artery disease, cerebrovascular disease, diabetes, hypertension and degenerative joint disease. I  have discussed with the patient the FDA warnings regarding testosterone therapy, benefits and side effects and modes of administration as well as monitoring blood levels and side effects  on a regular basis The patient is agreeable that testosterone therapy should be an integral part of his/her wellness,quality of life and prevention of chronic disease.  Objective:   Today's Vitals: BP 128/70 (BP Location: Left Arm, Patient Position: Sitting, Cuff Size: Normal)   Pulse 78   Temp (!) 97.5 F (36.4 C) (Temporal)   Resp 18    Ht 5' 7"  (1.702 m)   Wt 209 lb (94.8 kg)   SpO2 98% Comment: with mask on.  BMI 32.73 kg/m  Vitals with BMI 11/07/2019 08/26/2019 05/31/2019  Height 5' 7"  5' 7"  -  Weight 209 lbs 205 lbs -  BMI 19.01 22.2 -  Systolic 411 464 314  Diastolic 70 69 94  Pulse 78 64 55     Physical Exam   He looks systemically well.  Blood pressure is well controlled.  He remains obese.  He is alert and orientated without any focal neurological signs.    Assessment   1. Mixed hyperlipidemia   2. Chronic idiopathic gout involving toe without tophus, unspecified laterality   3. Essential hypertension   4. Prediabetes   5. Testicular failure   6. Obesity (BMI 30.0-34.9)   7. Vitamin D deficiency disease   8. Encounter for hepatitis C screening test for low risk patient   9. Cerebrovascular accident (CVA), unspecified mechanism ([place])       Tests ordered Orders Placed This Encounter  Procedures  . CMP with eGFR([facility])  . Lipid Panel  . Hemoglobin A1c  . Vitamin D, 25-hydroxy  . Testosterone Total,Free,Bio, Males  . Hep C Antibody     Plan: 1. He will continue to work on nutrition to help his hyperlipidemia.  I have given him a diet sheet regarding this. 2. He will continue with testosterone therapy on a weekly basis and we will check levels today. 3. He will continue with vitamin D3 supplementation and I will check levels today also. 4. He was given influenza vaccination today. 5. Further recommendations will depend on blood results and I will see him in 3 months time for follow-up.   No orders of the defined types were placed in this encounter.   Doree Albee, MD

## 2019-11-08 LAB — COMPLETE METABOLIC PANEL WITH GFR
AG Ratio: 1.7 (calc) (ref 1.0–2.5)
ALT: 15 U/L (ref 9–46)
AST: 20 U/L (ref 10–35)
Albumin: 4.2 g/dL (ref 3.6–5.1)
Alkaline phosphatase (APISO): 56 U/L (ref 35–144)
BUN: 20 mg/dL (ref 7–25)
CO2: 23 mmol/L (ref 20–32)
Calcium: 9.2 mg/dL (ref 8.6–10.3)
Chloride: 104 mmol/L (ref 98–110)
Creat: 1.24 mg/dL (ref 0.70–1.25)
GFR, Est African American: 72 mL/min/{1.73_m2} (ref >=60)
GFR, Est Non African American: 62 mL/min/{1.73_m2} (ref >=60)
Globulin: 2.5 g/dL (calc) (ref 1.9–3.7)
Glucose, Bld: 88 mg/dL (ref 65–99)
Potassium: 4.2 mmol/L (ref 3.5–5.3)
Sodium: 139 mmol/L (ref 135–146)
Total Bilirubin: 0.7 mg/dL (ref 0.2–1.2)
Total Protein: 6.7 g/dL (ref 6.1–8.1)

## 2019-11-08 LAB — LIPID PANEL
Cholesterol: 224 mg/dL — ABNORMAL HIGH (ref <200)
HDL: 32 mg/dL — ABNORMAL LOW (ref >=40)
LDL Cholesterol (Calc): 138 mg/dL (calc) — ABNORMAL HIGH
Non-HDL Cholesterol (Calc): 192 mg/dL (calc) — ABNORMAL HIGH (ref <130)
Total CHOL/HDL Ratio: 7 (calc) — ABNORMAL HIGH (ref <5.0)
Triglycerides: 352 mg/dL — ABNORMAL HIGH (ref <150)

## 2019-11-08 LAB — HEPATITIS C ANTIBODY
Hepatitis C Ab: NONREACTIVE
SIGNAL TO CUT-OFF: 0.01 (ref <1.00)

## 2019-11-08 LAB — HEMOGLOBIN A1C
Hgb A1c MFr Bld: 5.4 % of total Hgb (ref <5.7)
Mean Plasma Glucose: 108 (calc)
eAG (mmol/L): 6 (calc)

## 2019-11-08 LAB — TESTOSTERONE TOTAL,FREE,BIO, MALES
Albumin: 4.2 g/dL (ref 3.6–5.1)
Sex Hormone Binding: 20 nmol/L — ABNORMAL LOW (ref 22–77)
Testosterone, Bioavailable: 516.3 ng/dL (ref >=110.0)
Testosterone, Free: 268.1 pg/mL — ABNORMAL HIGH (ref 46.0–224.0)
Testosterone: 1072 ng/dL — ABNORMAL HIGH (ref 250–827)

## 2019-11-08 LAB — VITAMIN D 25 HYDROXY (VIT D DEFICIENCY, FRACTURES): Vit D, 25-Hydroxy: 34 ng/mL (ref 30–100)

## 2019-11-11 ENCOUNTER — Other Ambulatory Visit (INDEPENDENT_AMBULATORY_CARE_PROVIDER_SITE_OTHER): Payer: Self-pay | Admitting: Internal Medicine

## 2019-11-11 ENCOUNTER — Encounter (INDEPENDENT_AMBULATORY_CARE_PROVIDER_SITE_OTHER): Payer: Self-pay | Admitting: Internal Medicine

## 2019-11-11 ENCOUNTER — Other Ambulatory Visit (INDEPENDENT_AMBULATORY_CARE_PROVIDER_SITE_OTHER): Payer: Self-pay

## 2019-11-11 MED ORDER — TESTOSTERONE CYPIONATE 200 MG/ML IJ SOLN: 100.0000 mg | INTRAMUSCULAR | 1 refills | Status: DC

## 2019-11-11 MED ORDER — ALLOPURINOL 300 MG PO TABS: 150.0000 mg | ORAL_TABLET | Freq: Every day | ORAL | 2 refills | Status: DC

## 2019-11-11 NOTE — Progress Notes (Unsigned)
PT WIFE CALLED TO ASK FOR REFILL ON MEDICATIONS.

## 2019-11-12 ENCOUNTER — Encounter: Payer: Self-pay | Admitting: Internal Medicine

## 2019-12-29 ENCOUNTER — Observation Stay (HOSPITAL_COMMUNITY)
Admission: EM | Admit: 2019-12-29 | Discharge: 2019-12-31 | Disposition: A | Discharge status: Home / Self Care | Payer: 59 | Attending: Internal Medicine | Admitting: Internal Medicine

## 2019-12-29 ENCOUNTER — Other Ambulatory Visit: Payer: Self-pay

## 2019-12-29 ENCOUNTER — Encounter (HOSPITAL_COMMUNITY): Payer: Self-pay | Admitting: Emergency Medicine

## 2019-12-29 ENCOUNTER — Emergency Department (HOSPITAL_COMMUNITY): Payer: 59

## 2019-12-29 DIAGNOSIS — I440 Atrioventricular block, first degree: Secondary | ICD-10-CM | POA: Diagnosis not present

## 2019-12-29 DIAGNOSIS — I214 Non-ST elevation (NSTEMI) myocardial infarction: Secondary | ICD-10-CM | POA: Diagnosis not present

## 2019-12-29 DIAGNOSIS — I252 Old myocardial infarction: Secondary | ICD-10-CM | POA: Diagnosis not present

## 2019-12-29 DIAGNOSIS — R109 Unspecified abdominal pain: Secondary | ICD-10-CM | POA: Diagnosis not present

## 2019-12-29 DIAGNOSIS — Z888 Allergy status to other drugs, medicaments and biological substances status: Secondary | ICD-10-CM | POA: Insufficient documentation

## 2019-12-29 DIAGNOSIS — E669 Obesity, unspecified: Secondary | ICD-10-CM | POA: Insufficient documentation

## 2019-12-29 DIAGNOSIS — Z8673 Personal history of transient ischemic attack (TIA), and cerebral infarction without residual deficits: Secondary | ICD-10-CM | POA: Diagnosis not present

## 2019-12-29 DIAGNOSIS — U071 COVID-19: Secondary | ICD-10-CM

## 2019-12-29 DIAGNOSIS — Z87442 Personal history of urinary calculi: Secondary | ICD-10-CM | POA: Diagnosis not present

## 2019-12-29 DIAGNOSIS — M109 Gout, unspecified: Secondary | ICD-10-CM | POA: Diagnosis not present

## 2019-12-29 DIAGNOSIS — K219 Gastro-esophageal reflux disease without esophagitis: Secondary | ICD-10-CM | POA: Diagnosis present

## 2019-12-29 DIAGNOSIS — I639 Cerebral infarction, unspecified: Secondary | ICD-10-CM | POA: Diagnosis present

## 2019-12-29 DIAGNOSIS — E66811 Obesity, class 1: Secondary | ICD-10-CM | POA: Diagnosis present

## 2019-12-29 DIAGNOSIS — I10 Essential (primary) hypertension: Secondary | ICD-10-CM | POA: Diagnosis not present

## 2019-12-29 DIAGNOSIS — Z79899 Other long term (current) drug therapy: Secondary | ICD-10-CM | POA: Insufficient documentation

## 2019-12-29 DIAGNOSIS — E782 Mixed hyperlipidemia: Secondary | ICD-10-CM | POA: Diagnosis not present

## 2019-12-29 DIAGNOSIS — R079 Chest pain, unspecified: Secondary | ICD-10-CM

## 2019-12-29 DIAGNOSIS — Z87891 Personal history of nicotine dependence: Secondary | ICD-10-CM | POA: Diagnosis not present

## 2019-12-29 DIAGNOSIS — R7303 Prediabetes: Secondary | ICD-10-CM | POA: Diagnosis present

## 2019-12-29 DIAGNOSIS — R0789 Other chest pain: Secondary | ICD-10-CM | POA: Diagnosis not present

## 2019-12-29 DIAGNOSIS — Z6831 Body mass index (BMI) 31.0-31.9, adult: Secondary | ICD-10-CM | POA: Insufficient documentation

## 2019-12-29 DIAGNOSIS — Z7982 Long term (current) use of aspirin: Secondary | ICD-10-CM | POA: Diagnosis not present

## 2019-12-29 DIAGNOSIS — E785 Hyperlipidemia, unspecified: Secondary | ICD-10-CM | POA: Diagnosis present

## 2019-12-29 DIAGNOSIS — Z833 Family history of diabetes mellitus: Secondary | ICD-10-CM | POA: Diagnosis not present

## 2019-12-29 LAB — BASIC METABOLIC PANEL
Anion gap: 8 (ref 5–15)
BUN: 16 mg/dL (ref 8–23)
CO2: 26 mmol/L (ref 22–32)
Calcium: 8.1 mg/dL — ABNORMAL LOW (ref 8.9–10.3)
Chloride: 102 mmol/L (ref 98–111)
Creatinine, Ser: 1.04 mg/dL (ref 0.61–1.24)
GFR calc Af Amer: >60 mL/min (ref >60)
GFR calc non Af Amer: >60 mL/min (ref >60)
Glucose, Bld: 95 mg/dL (ref 70–99)
Potassium: 3.9 mmol/L (ref 3.5–5.1)
Sodium: 136 mmol/L (ref 135–145)

## 2019-12-29 LAB — CBC
HCT: 58.6 % — ABNORMAL HIGH (ref 39.0–52.0)
Hemoglobin: 19 g/dL — ABNORMAL HIGH (ref 13.0–17.0)
MCH: 30.7 pg (ref 26.0–34.0)
MCHC: 32.4 g/dL (ref 30.0–36.0)
MCV: 94.7 fL (ref 80.0–100.0)
Platelets: 147 10*3/uL — ABNORMAL LOW (ref 150–400)
RBC: 6.19 MIL/uL — ABNORMAL HIGH (ref 4.22–5.81)
RDW: 13.3 % (ref 11.5–15.5)
WBC: 9.2 10*3/uL (ref 4.0–10.5)
nRBC: 0 % (ref 0.0–0.2)

## 2019-12-29 LAB — TROPONIN I (HIGH SENSITIVITY)
Troponin I (High Sensitivity): 145 ng/L (ref <18)
Troponin I (High Sensitivity): 148 ng/L (ref <18)

## 2019-12-29 LAB — HEPATIC FUNCTION PANEL
ALT: 21 U/L (ref 0–44)
AST: 21 U/L (ref 15–41)
Albumin: 4 g/dL (ref 3.5–5.0)
Alkaline Phosphatase: 56 U/L (ref 38–126)
Bilirubin, Direct: 0.1 mg/dL (ref 0.0–0.2)
Indirect Bilirubin: 0.3 mg/dL (ref 0.3–0.9)
Total Bilirubin: 0.4 mg/dL (ref 0.3–1.2)
Total Protein: 7 g/dL (ref 6.5–8.1)

## 2019-12-29 LAB — LIPASE, BLOOD: Lipase: 23 U/L (ref 11–51)

## 2019-12-29 LAB — POC SARS CORONAVIRUS 2 AG -  ED: SARS Coronavirus 2 Ag: POSITIVE — AB

## 2019-12-29 LAB — D-DIMER, QUANTITATIVE: D-Dimer, Quant: 0.54 ug/mL-FEU — ABNORMAL HIGH (ref 0.00–0.50)

## 2019-12-29 MED ORDER — HEPARIN BOLUS VIA INFUSION
4000.0000 [IU] | Freq: Once | INTRAVENOUS | Status: AC
  Administered 2019-12-29: 21:00:00 4000 [IU] via INTRAVENOUS

## 2019-12-29 MED ORDER — HEPARIN (PORCINE) 25000 UT/250ML-% IV SOLN
1250.0000 [IU]/h | INTRAVENOUS | Status: DC
  Administered 2019-12-29: 1000 [IU]/h via INTRAVENOUS
  Administered 2019-12-30: 15:00:00 1350 [IU]/h via INTRAVENOUS
  Administered 2019-12-31: 1250 [IU]/h via INTRAVENOUS
  Filled 2019-12-29 (×3): qty 250

## 2019-12-29 MED ORDER — NITROGLYCERIN 0.4 MG SL SUBL: 0.4000 mg | SUBLINGUAL_TABLET | SUBLINGUAL | Status: DC | PRN

## 2019-12-29 MED ORDER — ALUM & MAG HYDROXIDE-SIMETH 200-200-20 MG/5ML PO SUSP
30.0000 mL | Freq: Once | ORAL | Status: AC
  Administered 2019-12-29: 30 mL via ORAL
  Filled 2019-12-29: qty 30

## 2019-12-29 MED ORDER — MORPHINE SULFATE (PF) 4 MG/ML IV SOLN: 4.0000 mg | Freq: Once | INTRAVENOUS | Status: DC

## 2019-12-29 MED ORDER — LIDOCAINE VISCOUS HCL 2 % MT SOLN
15.0000 mL | Freq: Once | OROMUCOSAL | Status: AC
  Administered 2019-12-29: 19:00:00 15 mL via ORAL
  Filled 2019-12-29: qty 15

## 2019-12-29 MED ORDER — ASPIRIN 81 MG PO CHEW
324.0000 mg | CHEWABLE_TABLET | Freq: Once | ORAL | Status: AC
  Administered 2019-12-29: 324 mg via ORAL
  Filled 2019-12-29: qty 4

## 2019-12-29 MED ORDER — MORPHINE SULFATE (PF) 4 MG/ML IV SOLN
4.0000 mg | Freq: Once | INTRAVENOUS | Status: AC
  Administered 2019-12-29: 4 mg via INTRAVENOUS
  Filled 2019-12-29: qty 1

## 2019-12-29 NOTE — ED Triage Notes (Signed)
Patient complains of pain that starts in his chest radiating down to his abdomen and wrapping around his lower back that began Thursday afternoon. Denies chills and fever. Patient has a history of kidney stones.

## 2019-12-29 NOTE — Progress Notes (Signed)
ANTICOAGULATION CONSULT NOTE - Initial Consult  Pharmacy Consult for heparin gtt  Indication: ACS  Allergies  Allergen Reactions  . Prednisone Other (See Comments)    Peptic ulcers      Patient Measurements: Height: 5\' 7"  (170.2 cm) Weight: 210 lb (95.3 kg) IBW/kg (Calculated) : 66.1 Heparin Dosing Weight: HEPARIN DW (KG): 86.4   Vital Signs: Temp: 99.1 F (37.3 C) (01/03 1628) Temp Source: Oral (01/03 1628) BP: 147/104 (01/03 1930) Pulse Rate: 75 (01/03 1930)  Labs: Recent Labs    12/29/19 1904  HGB 19.0*  HCT 58.6*  PLT 147*  CREATININE 1.04    Estimated Creatinine Clearance: 82.1 mL/min (by C-G formula based on SCr of 1.04 mg/dL).   Medical History: Past Medical History:  Diagnosis Date  . GERD (gastroesophageal reflux disease)   . Gout   . Obesity (BMI 30.0-34.9) 11/07/2019  . Peptic ulcer disease   . Prediabetes 11/07/2019  . Stroke ([place])   . Testicular failure 11/07/2019    Medications:  (Not in a hospital admission)  Scheduled:  . heparin  4,000 Units Intravenous Once  .  morphine injection  4 mg Intravenous Once   Infusions:  . heparin     PRN:  Anti-infectives (From admission, onward)   None      Assessment: Jeffrey Allen a 64 y.o. male requires anticoagulation with a heparin iv infusion for the indication of  ACS. Heparin gtt will be started following pharmacy protocol per pharmacy consult. Patient is not on previous oral anticoagulant that will require aPTT/HL correlation before transitioning to only HL monitoring.   Goal of Therapy:  HL 0.3-0.7 Monitoring of platelets per anticoagulation protocol    Plan:  Give 4000 units bolus x 1 Start heparin infusion at 1000 units/hr Check anti-Xa level in 6 hours and daily while on heparin Continue to monitor H&H and platelets  Heparin level to be drawn in 6 hours. 8 hours for patients >60 years old or crcl < 27ml/min  Rinaldo Macqueen 12/29/2019,8:14 PM

## 2019-12-29 NOTE — ED Notes (Signed)
Date and time results received: 12/29/19 22;20 (use smartphrase ".now" to insert current time)  Test: covid  Critical Value: positive  Name of Provider Notified: Dr Charissa Bash  Orders Received? Or Actions Taken?: none

## 2019-12-29 NOTE — ED Notes (Signed)
Repeat EKG given to MD Vanita Panda.

## 2019-12-29 NOTE — ED Provider Notes (Addendum)
[facility] EMERGENCY DEPARTMENT Provider Note   CSN: [id number] Arrival date & time: 12/29/19  1539     History Chief Complaint  Patient presents with  . Chest Pain    Jeffrey Allen is a 64 y.o. male.  The history is provided by the patient and medical records. No language interpreter was used.  Chest Pain    64 year old male with history of GERD, peptic ulcer disease, prior stroke, hypertension present for evaluation of abdominal pain.  Patient report for the past 4 days he has had pain to his abdomen and chest.  He mention pain is to his epigastric region, lower abdomen, wraps around to his back and radiates down to both buttock region which has been waxing waning but never resolved.  Pain is moderate in severity, kept him up at night and is having difficulty sleeping.  Nothing seems to make the pain better or worse.  No associated fever, chills, lightheadedness, dizziness, shortness of breath, productive cough, dysuria, hematuria numbness or weakness.  Has history of peptic ulcer disease in the past and was voicing concern however he denies any dietary changes, loss of appetite, postprandial pain, nausea, vomiting diarrhea or constipation.  No significant treatment tried at home.  Remote history of kidney stones.  Denies tobacco or alcohol abuse.  No history of AAA.  Past Medical History:  Diagnosis Date  . GERD (gastroesophageal reflux disease)   . Gout   . Obesity (BMI 30.0-34.9) 11/07/2019  . Peptic ulcer disease   . Prediabetes 11/07/2019  . Stroke ([place])   . Testicular failure 11/07/2019    Patient Active Problem List   Diagnosis Date Noted  . Prediabetes 11/07/2019  . Testicular failure 11/07/2019  . Obesity (BMI 30.0-34.9) 11/07/2019  . Stroke ([place])   . Essential hypertension 06/19/2018  . Hyperlipidemia 06/19/2018  . Gout 06/18/2018  . GERD (gastroesophageal reflux disease) 06/18/2018  . Acute CVA (cerebrovascular accident) ([place]) 06/18/2018  . CARPAL TUNNEL  SYNDROME 04/06/2009  . CUBITAL TUNNEL SYNDROME 04/06/2009  . NECK PAIN, CHRONIC 04/06/2009  . Disturbance of skin sensation 04/06/2009  . ANKLE SPRAIN 12/03/2008    Past Surgical History:  Procedure Laterality Date  . COLONOSCOPY    . COLONOSCOPY N/A 12/07/2016   Procedure: COLONOSCOPY;  Surgeon: Daneil Dolin, MD;  Location: [facility] ENDO SUITE;  Service: Endoscopy;  Laterality: N/A;  9:15 AM       Family History  Problem Relation Age of Onset  . Diabetes Father   . Colon cancer Neg Hx     Social History   Tobacco Use  . Smoking status: Former Smoker    Packs/day: 0.02    Years: 25.00    Pack years: 0.50    Types: Cigarettes    Quit date: 12/26/2006    Years since quitting: 13.0  . Smokeless tobacco: Never Used  Substance Use Topics  . Alcohol use: Yes    Alcohol/week: 14.0 standard drinks    Types: 14 Cans of beer per week  . Drug use: No    Home Medications Prior to Admission medications   Medication Sig Start Date End Date Taking? Authorizing Provider  allopurinol (ZYLOPRIM) 300 MG tablet Take 0.5 tablets (150 mg total) by mouth daily. 11/11/19   Doree Albee, MD  Cholecalciferol (VITAMIN D3) 125 MCG (5000 UT) TABS Take 2 tablets by mouth daily.    [provider]  Testosterone Cypionate 200 MG/ML SOLN Inject 100 mg as directed once a week. 11/11/19   Anastasio Champion,  Nimish C, MD    Allergies    Prednisone  Review of Systems   Review of Systems  Cardiovascular: Positive for chest pain.  All other systems reviewed and are negative.   Physical Exam Updated Vital Signs BP (!) 150/94 (BP Location: Right Arm)   Pulse 70   Temp 99.1 F (37.3 C) (Oral)   Resp 16   Ht 5\' 7"  (1.702 m)   Wt 95.3 kg   SpO2 100%   BMI 32.89 kg/m   Physical Exam Vitals and nursing note reviewed.  Constitutional:      General: He is not in acute distress.    Appearance: He is well-developed.  HENT:     Head: Atraumatic.  Eyes:     Conjunctiva/sclera: Conjunctivae  normal.  Cardiovascular:     Rate and Rhythm: Normal rate and regular rhythm.     Heart sounds: Normal heart sounds.  Pulmonary:     Effort: Pulmonary effort is normal.     Breath sounds: Normal breath sounds.  Abdominal:     General: Abdomen is flat.     Palpations: Abdomen is soft.     Tenderness: There is no abdominal tenderness. There is no right CVA tenderness or left CVA tenderness.  Musculoskeletal:        General: No swelling.     Cervical back: Neck supple.  Skin:    Capillary Refill: Capillary refill takes less than 2 seconds.     Findings: No rash.  Neurological:     Mental Status: He is alert and oriented to person, place, and time.  Psychiatric:        Mood and Affect: Mood normal.     ED Results / Procedures / Treatments   Labs (all labs ordered are listed, but only abnormal results are displayed) Labs Reviewed  BASIC METABOLIC PANEL - Abnormal; Notable for the following components:      Result Value   Calcium 8.1 (*)    All other components within normal limits  CBC - Abnormal; Notable for the following components:   RBC 6.19 (*)    Hemoglobin 19.0 (*)    HCT 58.6 (*)    Platelets 147 (*)    All other components within normal limits  D-DIMER, QUANTITATIVE (NOT AT [facility]) - Abnormal; Notable for the following components:   D-Dimer, Quant 0.54 (*)    All other components within normal limits  POC SARS CORONAVIRUS 2 AG -  ED - Abnormal; Notable for the following components:   SARS Coronavirus 2 Ag POSITIVE (*)    All other components within normal limits  TROPONIN I (HIGH SENSITIVITY) - Abnormal; Notable for the following components:   Troponin I (High Sensitivity) 145 (*)    All other components within normal limits  LIPASE, BLOOD  HEPATIC FUNCTION PANEL  URINALYSIS, ROUTINE W REFLEX MICROSCOPIC  HEPARIN LEVEL (UNFRACTIONATED)  HEPARIN LEVEL (UNFRACTIONATED)  CBC  TROPONIN I (HIGH SENSITIVITY)    EKG EKG Interpretation  Date/Time:  Sunday December 29 2019 20:12:09 EST Ventricular Rate:  76 PR Interval:    QRS Duration: 96 QT Interval:  373 QTC Calculation: 420 R Axis:   -31 Text Interpretation: Sinus arrhythmia Ventricular premature complex Borderline prolonged PR interval Left axis deviation Baseline wander in lead(s) V2 T wave abnormality Abnormal ECG Confirmed by Carmin Muskrat [phone]) on 12/29/2019 8:33:32 PM   Radiology DG Chest Port 1 View  Result Date: 12/29/2019 CLINICAL DATA:  Chest pain EXAM: PORTABLE CHEST 1 VIEW COMPARISON:  None. FINDINGS: The heart size and mediastinal contours are within normal limits. Both lungs are clear. The visualized skeletal structures are unremarkable. IMPRESSION: No active disease. Electronically Signed   By: Inez Catalina M.D.   On: 12/29/2019 19:00    Procedures .Critical Care Performed by: Domenic Moras, PA-C Authorized by: Domenic Moras, PA-C   Critical care provider statement:    Critical care time (minutes):  45   Critical care was time spent personally by me on the following activities:  Discussions with consultants, evaluation of patient's response to treatment, examination of patient, ordering and performing treatments and interventions, ordering and review of laboratory studies, ordering and review of radiographic studies, pulse oximetry, re-evaluation of patient's condition, obtaining history from patient or surrogate and review of old charts   (including critical care time)  Medications Ordered in ED Medications  heparin bolus via infusion 4,000 Units (has no administration in time range)  heparin ADULT infusion 100 units/mL (25000 units/280mL sodium chloride 0.45%) (has no administration in time range)  nitroGLYCERIN (NITROSTAT) SL tablet 0.4 mg (has no administration in time range)  alum & mag hydroxide-simeth (MAALOX/MYLANTA) 200-200-20 MG/5ML suspension 30 mL (30 mLs Oral Given 12/29/19 1925)    And  lidocaine (XYLOCAINE) 2 % viscous mouth solution 15 mL (15 mLs Oral Given 12/29/19  1925)  morphine 4 MG/ML injection 4 mg (4 mg Intravenous Given 12/29/19 2036)    ED Course  I have reviewed the triage vital signs and the nursing notes.  Pertinent labs & imaging results that were available during my care of the patient were reviewed by me and considered in my medical decision making (see chart for details).    MDM Rules/Calculators/A&P                      BP (!) 140/96   Pulse 81   Temp 99.1 F (37.3 C) (Oral)   Resp 12   Ht 5\' 7"  (1.702 m)   Wt 95.3 kg   SpO2 97%   BMI 32.89 kg/m   Final Clinical Impression(s) / ED Diagnoses Final diagnoses:  NSTEMI (non-ST elevated myocardial infarction) ([place])  COVID-19 virus infection    Rx / DC Orders ED Discharge Orders    None     Patient here with complaints of abdominal pain, back pain and chest pain.  Symptoms ongoing for the past 4 days.  No reproducible pain noted on exam.  He has intact distal pedal pulses.  No abdominal pulsatile mass to suggest AAA or dissection.  No CVA tenderness to suggest renal stone.  Pain is atypical of ACS.  Pain medication offered which include GI cocktail. Care discussed with DR. Lockwood   8:11 PM  Labs remarkable for elevated troponin of 145. EKG unremarkable.  Pt with some improvement of pain but pain has not fully resolved. Heparin have been initiated for NSTEMI.  Cardiology called, awaits for response.  Pt is hemodynamically stable.   Initial D-dimer ordered and mildly elevated at 0.57, however it is within normal limit when age adjusted.  Low suspicion of PE or dissection.  HEAR score of 3.   9:07 PM Appreciate consultation from Cardiologist Dr. Tollie Pizza who recommend pt to be transfer to [facility] cardiology floor telemetry bed.  COVID-19 test ordered. Pt is stable for transfer.    9:20 PM COVID-19 test came back positive, which explains his diffused pain.  Pt is made aware.   [name] was evaluated in Emergency Department on  12/29/2019 for the symptoms described in  the history of present illness. He was evaluated in the context of the global COVID-19 pandemic, which necessitated consideration that the patient might be at risk for infection with the SARS-CoV-2 virus that causes COVID-19. Institutional protocols and algorithms that pertain to the evaluation of patients at risk for COVID-19 are in a state of rapid change based on information released by regulatory bodies including the CDC and federal and state organizations. These policies and algorithms were followed during the patient's care in the ED.  11:14 PM Cardiology request medicine admission.  Appreciate consultation from [facility] who agrees to admit pt to [facility], medical floor with cardiology available for consultation.  He also request for [place] to obtain inflammatory markers.  I have ordered the appropriate tests as requested.    Domenic Moras, PA-C 12/29/19 2211    Carmin Muskrat, MD 12/29/19 2240    Domenic Moras, PA-C 12/29/19 2315    Carmin Muskrat, MD 12/31/19 1036

## 2019-12-29 NOTE — H&P (Addendum)
History and Physical    Patient Demographics:    Jeffrey Allen [id number] DOB: [birth year] DOA: 12/29/2019  PCP: Doree Albee, MD  Patient coming from: Home  I have personally briefly reviewed patient's old medical records in [place]  Chief Complaint: Chest pain, muscle aches   Assessment & Plan:     Assessment/Plan Principal Problem:   NSTEMI (non-ST elevated myocardial infarction) ([place]) Active Problems:   GERD (gastroesophageal reflux disease)   Essential hypertension   Hyperlipidemia   Prediabetes   Obesity (BMI 30.0-34.9)   Stroke [facility])     Principal Problem: Chest pain possibly secondary to acute NSTEMI Patient presented with vague complaints of chest and abdominal pain.  No EKG changes on presentation.  Does have first-degree AV block and low volume complexes with left axis deviation which do not seem any different from prior.  Troponin is noted to be positive at 145.  Case was discussed with Dr. Tollie Pizza from the cardiology service who requested admission to the hospitalist service and transferred to [facility]. -We will place the patient on heparin drip and monitor PTT -We will place on aspirin 81 mg daily -Atorvastatin 80 mg daily, metoprolol 12.5 mg twice daily -Cardiology consulted time of transfer to [facility] -Nitroglycerin, morphine as needed -Repeat EKG as needed  Other Active Problems: COVID-19 infection Patient presented with vague complaints of abdominal pain, back pain.  No other symptoms including shortness of breath, sore throat, loss of sense of taste or smell, fever, chills, diarrhea.  SARS-CoV-2 antigen screen is positive in ER.  Chest x-ray shows no infiltrates. Inflammatory markers pending We will place on zinc, cholecalciferol, ascorbic acid Appears to be a mild infection with no evidence of hypoxemia, bilateral infiltrates.  Will defer dexamethasone and remdesivir for now.  History of CVA: Patient had right frontal lobe multiple  small patchy acute infarcts in the MCA territory in June 2019.  This was thought to be cryptogenic stroke possibly due to an embolic phenomenon. -Not on antiplatelets for unclear reasons.  Will place on aspirin  Hyperlipidemia: Previously on atorvastatin.  Currently not on medications.   -Will place on atorvastatin 80 mg daily.   -Repeat lipid panel in a.m.  History of essential hypertension: Not on medications currently.  Will place on low-dose metoprolol  Gout: Continue allopurinol  GERD/PUD: Currently not on medications We will place on PPI, Maalox as needed  History of Dupuytren's contractures and carpal tunnel syndrome: Follow with hand surgery as outpatient  DVT prophylaxis: Heparin Code Status:  Full code Family Communication: N/A  Disposition Plan: Admitted as inpatient to [facility], will need transfer for cardiology evaluation Consults called: N/A Admission status: Inpatient status    HPI:     HPI: Jeffrey Allen is a 64 y.o. male with medical history significant of CVA, gout, hypertension, hyperlipidemia who presented to the ER with a 4-day history of  abdominal pain.  Patient reports having epigastric pain and pain to his lower abdomen radiating to his back as well as some generalized muscle aches described as intermittent, moderate intensity pain.  No exacerbating or relieving factors.  Also mentions some substernal chest pain associated with the above.  No associated fever, chills, shortness of breath, cough, sore throat, dizziness, lightheadedness, dysuria, seizures, syncope, focal deficits, loss of sense of taste or smell.  Patient reports his daughter and daughter-in-law were both positive for COVID-19 but he had no exposure to them.  Patient is noted to have a positive troponin in  the ER.  Case was discussed with Dr. Tollie Pizza from cardiology service at [facility] who recommended transfer to [facility] for further evaluation.  Patient was noted to have a positive  COVID-19 antigen and subsequently hospitalist service was contacted for admission. ED Course:  Vital Signs reviewed on presentation, significant for temperature 99.1, heart rate 78, blood pressure 127/86, saturation 93% on room air. Labs reviewed, significant for sodium 136, potassium 3.9, BUN 16, creatinine 1.04, LFTs within normal limits, troponin I 45 with repeat of 148.  WBC count 9.2, hemoglobin 19.0, hematocrit 58, platelets 147, D-dimer 0.54, SARS COVID-19 screen is positive. Imaging personally Reviewed, chest x-ray shows no acute cardiopulmonary disease. EKG personally reviewed, shows sinus rhythm, first-degree AV block, PVCs, low volume complexes, left axis deviation, no acute ischemic changes.    Review of systems:    Review of Systems: As per HPI otherwise 10 point review of systems negative.  All other review of systems is negative except the ones noted above in the HPI.    Past Medical and Surgical History:  Reviewed by me  Past Medical History:  Diagnosis Date  . GERD (gastroesophageal reflux disease)   . Gout   . Obesity (BMI 30.0-34.9) 11/07/2019  . Peptic ulcer disease   . Prediabetes 11/07/2019  . Stroke ([place])   . Testicular failure 11/07/2019    Past Surgical History:  Procedure Laterality Date  . COLONOSCOPY    . COLONOSCOPY N/A 12/07/2016   Procedure: COLONOSCOPY;  Surgeon: Daneil Dolin, MD;  Location: [facility];  Service: Endoscopy;  Laterality: N/A;  9:15 AM     Social History:  Reviewed by me   reports that he quit smoking about 13 years ago. His smoking use included cigarettes. He has a 0.50 pack-year smoking history. He has never used smokeless tobacco. He reports current alcohol use of about 14.0 standard drinks of alcohol per week. He reports that he does not use drugs.  Allergies:    Allergies  Allergen Reactions  . Prednisone Other (See Comments)    Peptic ulcers      Family History :   Family History  Problem Relation Age of  Onset  . Diabetes Father   . Colon cancer Neg Hx    Family history reviewed, noted as above, not pertinent to current presentation.   Home Medications:    Prior to Admission medications   Medication Sig Start Date End Date Taking? Authorizing Provider  allopurinol (ZYLOPRIM) 300 MG tablet Take 0.5 tablets (150 mg total) by mouth daily. 11/11/19   Doree Albee, MD  Cholecalciferol (VITAMIN D3) 125 MCG (5000 UT) TABS Take 2 tablets by mouth daily.    [provider]  Testosterone Cypionate 200 MG/ML SOLN Inject 100 mg as directed once a week. 11/11/19   Doree Albee, MD    Physical Exam:    Physical Exam: Vitals:   12/29/19 2132 12/29/19 2145 12/29/19 2200 12/29/19 2230  BP:   127/86 (!) 133/95  Pulse: 82 81 78 76  Resp: (!) 21 16 (!) 21 15  Temp:      TempSrc:      SpO2: 98% 96% 93% 94%  Weight:      Height:        Constitutional: NAD, calm, comfortable Vitals:   12/29/19 2132 12/29/19 2145 12/29/19 2200 12/29/19 2230  BP:   127/86 (!) 133/95  Pulse: 82 81 78 76  Resp: (!) 21 16 (!) 21 15  Temp:  TempSrc:      SpO2: 98% 96% 93% 94%  Weight:      Height:       Eyes: PERRL, lids and conjunctivae normal ENMT: Mucous membranes are moist. Posterior pharynx clear of any exudate or lesions.Normal dentition.  Neck: normal, supple, no masses, no thyromegaly Respiratory: clear to auscultation bilaterally, no wheezing, no crackles. Normal respiratory effort. No accessory muscle use.  Cardiovascular: Regular rate and rhythm, no murmurs / rubs / gallops. No extremity edema. 2+ pedal pulses. No carotid bruits.  Abdomen: no tenderness, no masses palpated. No hepatosplenomegaly. Bowel sounds positive.  Musculoskeletal: no clubbing / cyanosis. No joint deformity upper and lower extremities. Good ROM, Dupuytren's contractures noted on both hands. Normal muscle tone.  Skin: no rashes, lesions, ulcers. No induration Neurologic: CN 2-12 grossly intact. Sensation  intact, DTR normal. Strength 5/5 in all 4.  Psychiatric: Normal judgment and insight. Alert and oriented x 3. Normal mood.    Decubitus Ulcers: Not present on admission Catheters and tubes: None  Data Review:    Labs on Admission: I have personally reviewed following labs and imaging studies  CBC: Recent Labs  Lab 12/29/19 1904  WBC 9.2  HGB 19.0*  HCT 58.6*  MCV 94.7  PLT [id number]*   Basic Metabolic Panel: Recent Labs  Lab 12/29/19 1904  NA 136  K 3.9  CL 102  CO2 26  GLUCOSE 95  BUN 16  CREATININE 1.04  CALCIUM 8.1*   GFR: Estimated Creatinine Clearance: 82.1 mL/min (by C-G formula based on SCr of 1.04 mg/dL). Liver Function Tests: Recent Labs  Lab 12/29/19 1904  AST 21  ALT 21  ALKPHOS 56  BILITOT 0.4  PROT 7.0  ALBUMIN 4.0   Recent Labs  Lab 12/29/19 1904  LIPASE 23   No results for input(s): AMMONIA in the last 168 hours. Coagulation Profile: No results for input(s): INR, PROTIME in the last 168 hours. Cardiac Enzymes: No results for input(s): CKTOTAL, CKMB, CKMBINDEX, TROPONINI in the last 168 hours. BNP (last 3 results) No results for input(s): PROBNP in the last 8760 hours. HbA1C: No results for input(s): HGBA1C in the last 72 hours. CBG: No results for input(s): GLUCAP in the last 168 hours. Lipid Profile: No results for input(s): CHOL, HDL, LDLCALC, TRIG, CHOLHDL, LDLDIRECT in the last 72 hours. Thyroid Function Tests: No results for input(s): TSH, T4TOTAL, FREET4, T3FREE, THYROIDAB in the last 72 hours. Anemia Panel: No results for input(s): VITAMINB12, FOLATE, FERRITIN, TIBC, IRON, RETICCTPCT in the last 72 hours. Urine analysis:    Component Value Date/Time   COLORURINE COLORLESS (A) 06/18/2018 1822   APPEARANCEUR CLEAR 06/18/2018 1822   LABSPEC 1.030 06/18/2018 1822   PHURINE 6.0 06/18/2018 1822   GLUCOSEU NEGATIVE 06/18/2018 1822   HGBUR NEGATIVE 06/18/2018 1822   BILIRUBINUR NEGATIVE 06/18/2018 1822   KETONESUR NEGATIVE  06/18/2018 1822   PROTEINUR NEGATIVE 06/18/2018 1822   UROBILINOGEN 0.2 09/04/2015 2156   NITRITE NEGATIVE 06/18/2018 1822   LEUKOCYTESUR NEGATIVE 06/18/2018 1822     Imaging Results:      Radiological Exams on Admission: DG Chest Port 1 View  Result Date: 12/29/2019 CLINICAL DATA:  Chest pain EXAM: PORTABLE CHEST 1 VIEW COMPARISON:  None. FINDINGS: The heart size and mediastinal contours are within normal limits. Both lungs are clear. The visualized skeletal structures are unremarkable. IMPRESSION: No active disease. Electronically Signed   By: Inez Catalina M.D.   On: 12/29/2019 19:00      Selwyn Reason Ginette Otto MD  [facility]  If 7PM-7AM, please contact night-coverage   12/29/2019, 11:07 PM

## 2019-12-30 ENCOUNTER — Other Ambulatory Visit (HOSPITAL_COMMUNITY): Payer: 59

## 2019-12-30 ENCOUNTER — Encounter (HOSPITAL_COMMUNITY): Payer: Self-pay | Admitting: Internal Medicine

## 2019-12-30 DIAGNOSIS — R778 Other specified abnormalities of plasma proteins: Secondary | ICD-10-CM | POA: Diagnosis not present

## 2019-12-30 DIAGNOSIS — I10 Essential (primary) hypertension: Secondary | ICD-10-CM

## 2019-12-30 DIAGNOSIS — R079 Chest pain, unspecified: Secondary | ICD-10-CM

## 2019-12-30 DIAGNOSIS — E782 Mixed hyperlipidemia: Secondary | ICD-10-CM | POA: Diagnosis not present

## 2019-12-30 DIAGNOSIS — U071 COVID-19: Secondary | ICD-10-CM

## 2019-12-30 DIAGNOSIS — M109 Gout, unspecified: Secondary | ICD-10-CM | POA: Diagnosis not present

## 2019-12-30 DIAGNOSIS — R7303 Prediabetes: Secondary | ICD-10-CM

## 2019-12-30 DIAGNOSIS — R0789 Other chest pain: Secondary | ICD-10-CM | POA: Diagnosis not present

## 2019-12-30 DIAGNOSIS — I214 Non-ST elevation (NSTEMI) myocardial infarction: Secondary | ICD-10-CM | POA: Diagnosis not present

## 2019-12-30 DIAGNOSIS — E669 Obesity, unspecified: Secondary | ICD-10-CM

## 2019-12-30 LAB — CBC
HCT: 58.1 % — ABNORMAL HIGH (ref 39.0–52.0)
Hemoglobin: 18.7 g/dL — ABNORMAL HIGH (ref 13.0–17.0)
MCH: 30.6 pg (ref 26.0–34.0)
MCHC: 32.2 g/dL (ref 30.0–36.0)
MCV: 94.9 fL (ref 80.0–100.0)
Platelets: 130 10*3/uL — ABNORMAL LOW (ref 150–400)
RBC: 6.12 MIL/uL — ABNORMAL HIGH (ref 4.22–5.81)
RDW: 13.2 % (ref 11.5–15.5)
WBC: 8.5 10*3/uL (ref 4.0–10.5)
nRBC: 0 % (ref 0.0–0.2)

## 2019-12-30 LAB — LACTIC ACID, PLASMA: Lactic Acid, Venous: 1.4 mmol/L (ref 0.5–1.9)

## 2019-12-30 LAB — COMPREHENSIVE METABOLIC PANEL
ALT: 20 U/L (ref 0–44)
AST: 23 U/L (ref 15–41)
Albumin: 3.8 g/dL (ref 3.5–5.0)
Alkaline Phosphatase: 53 U/L (ref 38–126)
Anion gap: 9 (ref 5–15)
BUN: 16 mg/dL (ref 8–23)
CO2: 22 mmol/L (ref 22–32)
Calcium: 7.9 mg/dL — ABNORMAL LOW (ref 8.9–10.3)
Chloride: 106 mmol/L (ref 98–111)
Creatinine, Ser: 0.95 mg/dL (ref 0.61–1.24)
GFR calc Af Amer: >60 mL/min (ref >60)
GFR calc non Af Amer: >60 mL/min (ref >60)
Glucose, Bld: 89 mg/dL (ref 70–99)
Potassium: 3.9 mmol/L (ref 3.5–5.1)
Sodium: 137 mmol/L (ref 135–145)
Total Bilirubin: 0.7 mg/dL (ref 0.3–1.2)
Total Protein: 6.8 g/dL (ref 6.5–8.1)

## 2019-12-30 LAB — HEMOGLOBIN A1C
Hgb A1c MFr Bld: 5.6 % (ref 4.8–5.6)
Mean Plasma Glucose: 114.02 mg/dL

## 2019-12-30 LAB — LIPID PANEL
Cholesterol: 202 mg/dL — ABNORMAL HIGH (ref 0–200)
HDL: 35 mg/dL — ABNORMAL LOW (ref >40)
LDL Cholesterol: 146 mg/dL — ABNORMAL HIGH (ref 0–99)
Total CHOL/HDL Ratio: 5.8 RATIO
Triglycerides: 103 mg/dL (ref <150)
VLDL: 21 mg/dL (ref 0–40)

## 2019-12-30 LAB — FERRITIN: Ferritin: 69 ng/mL (ref 24–336)

## 2019-12-30 LAB — HEPARIN LEVEL (UNFRACTIONATED)
Heparin Unfractionated: 0.12 IU/mL — ABNORMAL LOW (ref 0.30–0.70)
Heparin Unfractionated: 0.67 IU/mL (ref 0.30–0.70)
Heparin Unfractionated: 0.78 IU/mL — ABNORMAL HIGH (ref 0.30–0.70)

## 2019-12-30 LAB — TRIGLYCERIDES: Triglycerides: 100 mg/dL (ref <150)

## 2019-12-30 LAB — FIBRINOGEN: Fibrinogen: 338 mg/dL (ref 210–475)

## 2019-12-30 LAB — LACTATE DEHYDROGENASE: LDH: 191 U/L (ref 98–192)

## 2019-12-30 LAB — C-REACTIVE PROTEIN: CRP: 0.7 mg/dL (ref <1.0)

## 2019-12-30 LAB — HIV ANTIBODY (ROUTINE TESTING W REFLEX): HIV Screen 4th Generation wRfx: NONREACTIVE

## 2019-12-30 LAB — PROCALCITONIN: Procalcitonin: <0.1 ng/mL

## 2019-12-30 LAB — ABO/RH: ABO/RH(D): A POS

## 2019-12-30 MED ORDER — ALUM & MAG HYDROXIDE-SIMETH 200-200-20 MG/5ML PO SUSP: 30.0000 mL | ORAL | Status: DC | PRN

## 2019-12-30 MED ORDER — PANTOPRAZOLE SODIUM 40 MG IV SOLR
40.0000 mg | INTRAVENOUS | Status: DC
  Administered 2019-12-30 – 2019-12-31 (×2): 40 mg via INTRAVENOUS
  Filled 2019-12-30: qty 40

## 2019-12-30 MED ORDER — ASPIRIN EC 81 MG PO TBEC
81.0000 mg | DELAYED_RELEASE_TABLET | Freq: Every day | ORAL | Status: DC
  Administered 2019-12-31: 10:00:00 81 mg via ORAL
  Filled 2019-12-30: qty 1

## 2019-12-30 MED ORDER — ASCORBIC ACID 500 MG PO TABS
500.0000 mg | ORAL_TABLET | Freq: Every day | ORAL | Status: DC
  Administered 2019-12-30 – 2019-12-31 (×2): 500 mg via ORAL
  Filled 2019-12-30 (×2): qty 1

## 2019-12-30 MED ORDER — MORPHINE SULFATE (PF) 2 MG/ML IV SOLN
1.0000 mg | INTRAVENOUS | Status: DC | PRN
  Administered 2019-12-30 – 2019-12-31 (×4): 1 mg via INTRAVENOUS
  Filled 2019-12-30 (×4): qty 1

## 2019-12-30 MED ORDER — ONDANSETRON HCL 4 MG/2ML IJ SOLN
4.0000 mg | Freq: Four times a day (QID) | INTRAMUSCULAR | Status: DC | PRN
  Administered 2019-12-30: 4 mg via INTRAVENOUS
  Filled 2019-12-30: qty 2

## 2019-12-30 MED ORDER — HEPARIN BOLUS VIA INFUSION
2000.0000 [IU] | Freq: Once | INTRAVENOUS | Status: AC
  Administered 2019-12-30: 04:00:00 2000 [IU] via INTRAVENOUS

## 2019-12-30 MED ORDER — ALLOPURINOL 300 MG PO TABS
150.0000 mg | ORAL_TABLET | Freq: Every day | ORAL | Status: DC
  Administered 2019-12-30 – 2019-12-31 (×2): 150 mg via ORAL
  Filled 2019-12-30 (×4): qty 1

## 2019-12-30 MED ORDER — ZINC SULFATE 220 (50 ZN) MG PO CAPS
220.0000 mg | ORAL_CAPSULE | Freq: Every day | ORAL | Status: DC
  Administered 2019-12-30 – 2019-12-31 (×2): 220 mg via ORAL
  Filled 2019-12-30 (×2): qty 1

## 2019-12-30 MED ORDER — ACETAMINOPHEN 325 MG PO TABS
650.0000 mg | ORAL_TABLET | ORAL | Status: DC | PRN
  Administered 2019-12-30 (×2): 650 mg via ORAL
  Filled 2019-12-30 (×2): qty 2

## 2019-12-30 MED ORDER — METOPROLOL TARTRATE 25 MG PO TABS
12.5000 mg | ORAL_TABLET | Freq: Two times a day (BID) | ORAL | Status: DC
  Administered 2019-12-30 – 2019-12-31 (×4): 12.5 mg via ORAL
  Filled 2019-12-30 (×4): qty 1

## 2019-12-30 MED ORDER — ATORVASTATIN CALCIUM 40 MG PO TABS
80.0000 mg | ORAL_TABLET | Freq: Every day | ORAL | Status: DC
  Administered 2019-12-30: 80 mg via ORAL
  Filled 2019-12-30: qty 2

## 2019-12-30 MED ORDER — NITROGLYCERIN 0.4 MG SL SUBL: 0.4000 mg | SUBLINGUAL_TABLET | SUBLINGUAL | Status: DC | PRN

## 2019-12-30 NOTE — Progress Notes (Signed)
[place] for heparin  Indication: ACS  Allergies  Allergen Reactions  . Prednisone Other (See Comments)    Peptic ulcers      Patient Measurements: Height: 5\' 7"  (170.2 cm) Weight: 201 lb 11.5 oz (91.5 kg) IBW/kg (Calculated) : 66.1 Heparin Dosing Weight: 85 kg  Vital Signs: Temp: 98.7 F (37.1 C) (01/04 1500) Temp Source: Oral (01/04 1500) BP: 116/80 (01/04 1500) Pulse Rate: 64 (01/04 1500)  Labs: Recent Labs    12/29/19 1904 12/29/19 2349 12/30/19 0002 12/30/19 0230 12/30/19 1304 12/30/19 1957  HGB 19.0*  --  18.7*  --   --   --   HCT 58.6*  --  58.1*  --   --   --   PLT 147*  --  130*  --   --   --   HEPARINUNFRC  --   --   --  0.12* 0.67 0.78*  CREATININE 1.04 0.95  --   --   --   --     Estimated Creatinine Clearance: 88.1 mL/min (by C-G formula based on SCr of 0.95 mg/dL).  Assessment: 64 y.o. male with chest pain and elevated cardiac markers for heparin. Heparin level is therapeutic at 0.67 F/U results of ECHO  Heparin level came back supratherapeutic this PM. We will reduce the dose and check in AM.   Goal of Therapy:  Heparin level 0.3-0.7 units/mL Monitoring of platelets per anticoagulation protocol    Plan:  Decrease heparin infusion at 1250 units/hr Check heparin level in AM Continue to monitor H&H and platelets  Onnie Boer, PharmD, BCIDP, AAHIVP, CPP Infectious Disease Pharmacist 12/30/2019 9:09 PM

## 2019-12-30 NOTE — ED Notes (Signed)
Meal tray given 

## 2019-12-30 NOTE — Consult Note (Signed)
Cardiology Consultation:   Patient ID: Jeffrey Allen; [id number]; 05-19-1956   Admit date: 12/29/2019 Date of Consult: 12/30/2019  Primary Care Provider: Doree Albee, MD Consulting Cardiologist:  [name], MD Primary Electrophysiologist: None   Patient Profile:   Jeffrey Allen is a 64 y.o. male with a history of hypertension, hyperlipidemia, prediabetes, and previous stroke back in June 2019 who is being seen today for the evaluation of left flank and lower costal/back discomfort and abnormal high-sensitivity troponin I levels at the request of Dr. Carles Collet.  History of Present Illness:   Mr. Allen presents to the ER complaining of sharp, nagging discomfort that has occurred intermittently in his left flank and lower costal region, wrapping around to the back.  He reports no nausea or emesis, no frank chest discomfort or shortness of breath.  He states that he thought he had a kidney stone, no hematuria however.  He also reports a prior history of bleeding ulcer, uses PPI inconsistently.  He does not describe any changes in stool.  During work-up in the ER he was found to be positive for SARS coronavirus 2.  He does not report any cough or chest congestion, no change in taste or smell.  Temperature was 99.1 degrees.  At baseline Mr. Marcum does not report any exertional chest pain or unusual breathlessness.  He states that he is active, works in Architect and also farming.  Past Medical History:  Diagnosis Date  . GERD (gastroesophageal reflux disease)   . Gout   . Obesity (BMI 30.0-34.9)   . Peptic ulcer disease   . Prediabetes   . Stroke ([place])   . Testicular failure     Past Surgical History:  Procedure Laterality Date  . COLONOSCOPY    . COLONOSCOPY N/A 12/07/2016   Procedure: COLONOSCOPY;  Surgeon: Daneil Dolin, MD;  Location: [facility];  Service: Endoscopy;  Laterality: N/A;  9:15 AM     Inpatient Medications: Scheduled Meds: . allopurinol  150 mg  Oral Daily  . vitamin C  500 mg Oral Daily  . [START ON 12/31/2019] aspirin EC  81 mg Oral Daily  . atorvastatin  80 mg Oral q1800  . metoprolol tartrate  12.5 mg Oral BID  . pantoprazole (PROTONIX) IV  40 mg Intravenous Q24H  . zinc sulfate  220 mg Oral Daily   Continuous Infusions: . heparin 1,350 Units/hr (12/30/19 0414)   PRN Meds: acetaminophen, alum & mag hydroxide-simeth, morphine injection, nitroGLYCERIN, ondansetron (ZOFRAN) IV  Allergies:    Allergies  Allergen Reactions  . Prednisone Other (See Comments)    Peptic ulcers      Social History:   Social History   Socioeconomic History  . Marital status: Married    Spouse name: Not on file  . Number of children: Not on file  . Years of education: Not on file  . Highest education level: Not on file  Occupational History  . Not on file  Tobacco Use  . Smoking status: Former Smoker    Packs/day: 0.02    Years: 25.00    Pack years: 0.50    Types: Cigarettes    Quit date: 12/26/2006    Years since quitting: 13.0  . Smokeless tobacco: Never Used  Substance and Sexual Activity  . Alcohol use: Yes    Alcohol/week: 14.0 standard drinks    Types: 14 Cans of beer per week  . Drug use: No  . Sexual activity: Not on file  Other Topics Concern  . 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   Social Determinants of Health   Financial Resource Strain:   . Difficulty of Paying Living Expenses: Not on file  Food Insecurity:   . Worried About Charity fundraiser in the Last Year: Not on file  . Ran Out of Food in the Last Year: Not on file  Transportation Needs:   . Lack of Transportation (Medical): Not on file  . Lack of Transportation (Non-Medical): Not on file  Physical Activity:   . Days of Exercise per Week: Not on file  . Minutes of Exercise per Session: Not on file  Stress:   . Feeling of Stress : Not on file  Social Connections:   . Frequency of  Communication with Friends and Family: Not on file  . Frequency of Social Gatherings with Friends and Family: Not on file  . Attends Religious Services: Not on file  . Active Member of Clubs or Organizations: Not on file  . Attends Archivist Meetings: Not on file  . Marital Status: Not on file  Intimate Partner Violence:   . Fear of Current or Ex-Partner: Not on file  . Emotionally Abused: Not on file  . Physically Abused: Not on file  . Sexually Abused: Not on file    Family History:   The patient's family history includes Diabetes in his father. There is no history of Colon cancer.  ROS:  Please see the history of present illness.  All other ROS reviewed and negative.     Physical Exam/Data:   Vitals:   12/30/19 0652 12/30/19 0845 12/30/19 0900 12/30/19 0930  BP:      Pulse: (!) 47 (!) 56 74 69  Resp: 13 11 17 17   Temp:      TempSrc:      SpO2: 96% 95% 96% 94%  Weight:      Height:       No intake or output data in the 24 hours ending 12/30/19 0944 Filed Weights   12/29/19 1629  Weight: 95.3 kg   Body mass index is 32.89 kg/m.   Gen: Patient is in no distress. HEENT: Conjunctiva and lids normal, wearing a mask. Neck: Supple, no elevated JVP or carotid bruits, no thyromegaly. Lungs: Clear to auscultation, nonlabored breathing at rest. Cardiac: Regular rate and rhythm, no S3 or significant systolic murmur, no pericardial rub. Abdomen: Soft, nontender, bowel sounds present, no guarding or rebound. Extremities: No pitting edema, distal pulses 2+. Skin: Warm and dry. Musculoskeletal: No kyphosis. Neuropsychiatric: Alert and oriented x3, affect grossly appropriate.  EKG:  An ECG dated 12/29/2019 was personally reviewed today and demonstrated:  Sinus rhythm with PVC, leftward axis, nonspecific T wave changes.  Telemetry:  I personally reviewed telemetry which shows sinus rhythm.  Relevant CV Studies:  Echocardiogram 06/19/2018: Study Conclusions  - Left  ventricle: The cavity size was normal. Wall thickness was   increased in a pattern of mild LVH. Systolic function was normal.   The estimated ejection fraction was in the range of 55% to 60%.   Wall motion was normal; there were no regional wall motion   abnormalities. Left ventricular diastolic function parameters   were normal. - Aortic valve: Valve area (VTI): 2.51 cm^2. Valve area (Vmax):   2.61 cm^2. Valve area (Vmean): 2.44 cm^2. - Mitral valve: There was mild regurgitation. - Left atrium: The atrium was moderately  dilated. - Technically adequate study.  Laboratory Data:  Chemistry Recent Labs  Lab 12/29/19 1904 12/29/19 2349  NA 136 137  K 3.9 3.9  CL 102 106  CO2 26 22  GLUCOSE 95 89  BUN 16 16  CREATININE 1.04 0.95  CALCIUM 8.1* 7.9*  GFRNONAA >60 >60  GFRAA >60 >60  ANIONGAP 8 9    Recent Labs  Lab 12/29/19 1904 12/29/19 2349  PROT 7.0 6.8  ALBUMIN 4.0 3.8  AST 21 23  ALT 21 20  ALKPHOS 56 53  BILITOT 0.4 0.7   Hematology Recent Labs  Lab 12/29/19 1904 12/30/19 0002  WBC 9.2 8.5  RBC 6.19* 6.12*  HGB 19.0* 18.7*  HCT 58.6* 58.1*  MCV 94.7 94.9  MCH 30.7 30.6  MCHC 32.4 32.2  RDW 13.3 13.2  PLT 147* 130*   Cardiac Enzymes Recent Labs  Lab 12/29/19 1904 12/29/19 2107  TROPONINIHS 145* 148*    DDimer Recent Labs  Lab 12/29/19 1909  DDIMER 0.54*    Radiology/Studies:  DG Chest Port 1 View  Result Date: 12/29/2019 CLINICAL DATA:  Chest pain EXAM: PORTABLE CHEST 1 VIEW COMPARISON:  None. FINDINGS: The heart size and mediastinal contours are within normal limits. Both lungs are clear. The visualized skeletal structures are unremarkable. IMPRESSION: No active disease. Electronically Signed   By: Inez Catalina M.D.   On: 12/29/2019 19:00    Assessment and Plan:   1.  High-sensitivity troponin I levels of 145 and 148.  At this point doubt ACS/NSTEMI.  He does not describe any chest pain and his ECG is nonspecific.  He presented with left  flank and costal discomfort wrapping to the back.  No baseline exertional chest pain or breathlessness.  Also incidentally diagnosed with COVID-19.  2.  Moderate to high pretest probability of ischemic heart disease based on age and gender with associated prediabetes and history of stroke.  3.  Tobacco abuse in remission.  4.  GERD with reported history of bleeding peptic ulcer.   5.  Mixed hyperlipidemia, LDL 146. Not on statin.  Per chart, case apparently discussed with cardiology on-call with recommendation to transfer to [facility] for further evaluation.  From a cardiac perspective, I would recommend proceeding with an echocardiogram.  If LVEF is normal without focal wall motion abnormalities, he most likely does not require inpatient ischemic evaluation, and would manage for COVID-19 as seen fit by primary team.  He does not appear acutely ill at this time and chest x-ray shows no infiltrates.  I would start him on aspirin and statin however, he probably does not need to continue on heparin if his echocardiogram is normal.  Outpatient ischemic testing can be discussed at follow-up once he has recovered from COVID-19.  If on the other hand he has evidence of cardiomyopathy, then we can certainly continue to follow and assess as an inpatient.  Signed, Rozann Lesches, MD  12/30/2019 9:44 AM

## 2019-12-30 NOTE — Progress Notes (Signed)
[place] for heparin  Indication: ACS  Allergies  Allergen Reactions  . Prednisone Other (See Comments)    Peptic ulcers      Patient Measurements: Height: 5\' 7"  (170.2 cm) Weight: 210 lb (95.3 kg) IBW/kg (Calculated) : 66.1 Heparin Dosing Weight: 85 kg  Vital Signs: BP: 107/78 (01/04 1400) Pulse Rate: 35 (01/04 1345)  Labs: Recent Labs    12/29/19 1904 12/29/19 2349 12/30/19 0002 12/30/19 0230 12/30/19 1304  HGB 19.0*  --  18.7*  --   --   HCT 58.6*  --  58.1*  --   --   PLT 147*  --  130*  --   --   HEPARINUNFRC  --   --   --  0.12* 0.67  CREATININE 1.04 0.95  --   --   --     Estimated Creatinine Clearance: 89.9 mL/min (by C-G formula based on SCr of 0.95 mg/dL).  Assessment: 64 y.o. male with chest pain and elevated cardiac markers for heparin. Heparin level is therapeutic at 0.67 F/U results of ECHO  Goal of Therapy:  Heparin level 0.3-0.7 units/mL Monitoring of platelets per anticoagulation protocol    Plan:  Continue heparin infusion at1350 units/hr Check anti-Xa level in 6 hours and daily while on heparin Continue to monitor H&H and platelets  Isac Sarna, BS Vena [place], BCPS Clinical Pharmacist Pager [phone] 12/30/2019,2:42 PM

## 2019-12-30 NOTE — Progress Notes (Signed)
[place] for heparin  Indication: ACS  Allergies  Allergen Reactions  . Prednisone Other (See Comments)    Peptic ulcers      Patient Measurements: Height: 5\' 7"  (170.2 cm) Weight: 210 lb (95.3 kg) IBW/kg (Calculated) : 66.1 Heparin Dosing Weight: 85 kg  Vital Signs: Temp: 99.1 F (37.3 C) (01/03 1628) Temp Source: Oral (01/03 1628) BP: 131/79 (01/04 0230) Pulse Rate: 77 (01/04 0230)  Labs: Recent Labs    12/29/19 1904 12/29/19 2349 12/30/19 0002 12/30/19 0230  HGB 19.0*  --  18.7*  --   HCT 58.6*  --  58.1*  --   PLT 147*  --  130*  --   HEPARINUNFRC  --   --   --  0.12*  CREATININE 1.04 0.95  --   --     Estimated Creatinine Clearance: 89.9 mL/min (by C-G formula based on SCr of 0.95 mg/dL).  Assessment: 64 y.o. male with chest pain and elevated cardiac markers for heparin  Goal of Therapy:  Heparin level 0.3-0.7 units/mL Monitoring of platelets per anticoagulation protocol    Plan:  Heparin 2000 units IV bolus, then increase heparin 1350 units/hr Check heparin level in 6 hours.     Caryl Pina 12/30/2019,3:44 AM

## 2019-12-30 NOTE — Progress Notes (Signed)
PROGRESS NOTE  Jeffrey Allen [id number] DOB: [birth year] DOA: 12/29/2019 PCP: Doree Albee, MD  Brief History:  64 year old male with a history of hypertension, stroke, GERD, hyperlipidemia presenting with 4-day history of intermittent epigastric pain and substernal chest discomfort.  He stated that his abdominal pain would migrate to his lower abdomen and subsequently wrapped around to his lower back and occasionally go down to his buttocks.  He stated that it was moderate in intensity, but not worsened with exertion.  He denied any fevers, chills, coughing, shortness of breath, hemoptysis, nausea, vomiting, diarrhea.  There is no dysuria or hematuria. In the emergency department, the patient had temperature 99.1 F but was hemodynamically stable.  Oxygen saturation was 96% on room air.  BMP and LFTs were unremarkable.  CBC showed platelets of 130,000.  His SARS-CoV2 antigen assay came back positive.  Troponin was 145>>> 148. ED provider spoke with cardiology who recommended patient be transferred to [facility] for further evaluation.  Chest x-ray shows mild increased interstitial markings without any consolidations.  Assessment/Plan: Chest pain -More atypical by clinical history -Troponins minimally elevated -ED provider spoke with cardiology who recommended transfer to [facility] -Start aspirin -Continue statin -Personally reviewed chest x-ray--slight increased interstitial markings without consolidation -Personally reviewed EKG--sinus rhythm, nonspecific T wave change  COVID-19 infection -The patient is essentially asymptomatic from a pulmonary standpoint without hypoxia or dyspnea -No indication to start steroids or remdesivir -CRP 0.7 -Ferritin 69 -D-dimer 0.54 -Lactic acid 1.4 -PCT<0.10  Hyperlipidemia -LDL 146 -Start Lipitor 80 mg daily  Gouty arthritis -Continue allopurinol  Impaired glucose tolerance -Patient is not on any agents outpatient  History  of Stroke -was not taking ASA  -restart -continue statin  Abdominal pain -resolved at time of my evaluation -LFTs normal -continue PPI     Disposition Plan:   Home in 1-2 days  Family Communication:   Family at bedside  Consultants:  cardiology  Code Status:  FULL   DVT Prophylaxis:  IV Heparin    Procedures: As Listed in Progress Note Above  Antibiotics: None      Subjective: Patient denies fevers, chills, headache, chest pain, dyspnea, nausea, vomiting, diarrhea, abdominal pain, dysuria, hematuria, hematochezia, and melena.   Objective: Vitals:   12/30/19 0230 12/30/19 0300 12/30/19 0330 12/30/19 0652  BP: 131/79 122/87 109/87   Pulse: 77 76 65 (!) 47  Resp: 12 20 15 13   Temp:      TempSrc:      SpO2: 96% 94% 93% 96%  Weight:      Height:       No intake or output data in the 24 hours ending 12/30/19 0712 Weight change:  Exam:   General:  Pt is alert, follows commands appropriately, not in acute distress  HEENT: No icterus, No thrush, No neck mass, [place]/AT  Cardiovascular: RRR, S1/S2, no rubs, no gallops  Respiratory: CTA bilaterally, no wheezing, no crackles, no rhonchi  Abdomen: Soft/+BS, non tender, non distended, no guarding  Extremities: No edema, No lymphangitis, No petechiae, No rashes, no synovitis   Data Reviewed: I have personally reviewed following labs and imaging studies Basic Metabolic Panel: Recent Labs  Lab 12/29/19 1904 12/29/19 2349  NA 136 137  K 3.9 3.9  CL 102 106  CO2 26 22  GLUCOSE 95 89  BUN 16 16  CREATININE 1.04 0.95  CALCIUM 8.1* 7.9*   Liver Function Tests: Recent Labs  Lab 12/29/19 1904  12/29/19 2349  AST 21 23  ALT 21 20  ALKPHOS 56 53  BILITOT 0.4 0.7  PROT 7.0 6.8  ALBUMIN 4.0 3.8   Recent Labs  Lab 12/29/19 1904  LIPASE 23   No results for input(s): AMMONIA in the last 168 hours. Coagulation Profile: No results for input(s): INR, PROTIME in the last 168 hours. CBC: Recent Labs    Lab 12/29/19 1904 12/30/19 0002  WBC 9.2 8.5  HGB 19.0* 18.7*  HCT 58.6* 58.1*  MCV 94.7 94.9  PLT 147* 130*   Cardiac Enzymes: No results for input(s): CKTOTAL, CKMB, CKMBINDEX, TROPONINI in the last 168 hours. BNP: Invalid input(s): POCBNP CBG: No results for input(s): GLUCAP in the last 168 hours. HbA1C: No results for input(s): HGBA1C in the last 72 hours. Urine analysis:    Component Value Date/Time   COLORURINE COLORLESS (A) 06/18/2018 1822   APPEARANCEUR CLEAR 06/18/2018 1822   LABSPEC 1.030 06/18/2018 1822   PHURINE 6.0 06/18/2018 1822   GLUCOSEU NEGATIVE 06/18/2018 1822   HGBUR NEGATIVE 06/18/2018 1822   BILIRUBINUR NEGATIVE 06/18/2018 1822   KETONESUR NEGATIVE 06/18/2018 1822   PROTEINUR NEGATIVE 06/18/2018 1822   UROBILINOGEN 0.2 09/04/2015 2156   NITRITE NEGATIVE 06/18/2018 1822   LEUKOCYTESUR NEGATIVE 06/18/2018 1822   Sepsis Labs: @LABRCNTIP (procalcitonin:4,lacticidven:4) ) Recent Results (from the past 240 hour(s))  Blood Culture (routine x 2)     Status: None (Preliminary result)   Collection Time: 12/29/19 11:49 PM   Specimen: Left Antecubital; Blood  Result Value Ref Range Status   Specimen Description LEFT ANTECUBITAL  Final   Special Requests   Final    BOTTLES DRAWN AEROBIC AND ANAEROBIC Blood Culture adequate volume Performed at [facility], [address] [place], [place] [address]    Culture PENDING  Incomplete   Report Status PENDING  Incomplete  Blood Culture (routine x 2)     Status: None (Preliminary result)   Collection Time: 12/30/19 12:02 AM   Specimen: BLOOD RIGHT HAND  Result Value Ref Range Status   Specimen Description BLOOD RIGHT HAND  Final   Special Requests   Final    BOTTLES DRAWN AEROBIC AND ANAEROBIC Blood Culture adequate volume Performed at [facility], [address] [place], [place] [address]    Culture PENDING  Incomplete   Report Status PENDING  Incomplete     Scheduled Meds: . allopurinol  150 mg Oral  Daily  . vitamin C  500 mg Oral Daily  . [START ON 12/31/2019] aspirin EC  81 mg Oral Daily  . atorvastatin  80 mg Oral q1800  . metoprolol tartrate  12.5 mg Oral BID  . pantoprazole (PROTONIX) IV  40 mg Intravenous Q24H  . zinc sulfate  220 mg Oral Daily   Continuous Infusions: . heparin 1,350 Units/hr (12/30/19 0414)    Procedures/Studies: DG Chest Port 1 View  Result Date: 12/29/2019 CLINICAL DATA:  Chest pain EXAM: PORTABLE CHEST 1 VIEW COMPARISON:  None. FINDINGS: The heart size and mediastinal contours are within normal limits. Both lungs are clear. The visualized skeletal structures are unremarkable. IMPRESSION: No active disease. Electronically Signed   By: Inez Catalina M.D.   On: 12/29/2019 19:00    Orson Eva, DO  [facility] Pager [phone]  If 7PM-7AM, please contact night-coverage [email or web address] Password [email or web address] 12/30/2019, 7:12 AM   LOS: 0 days

## 2019-12-30 NOTE — Plan of Care (Signed)

## 2019-12-31 ENCOUNTER — Observation Stay (HOSPITAL_BASED_OUTPATIENT_CLINIC_OR_DEPARTMENT_OTHER): Payer: 59

## 2019-12-31 DIAGNOSIS — I10 Essential (primary) hypertension: Secondary | ICD-10-CM | POA: Diagnosis not present

## 2019-12-31 DIAGNOSIS — I340 Nonrheumatic mitral (valve) insufficiency: Secondary | ICD-10-CM | POA: Diagnosis not present

## 2019-12-31 DIAGNOSIS — U071 COVID-19: Secondary | ICD-10-CM | POA: Diagnosis not present

## 2019-12-31 DIAGNOSIS — E669 Obesity, unspecified: Secondary | ICD-10-CM | POA: Diagnosis not present

## 2019-12-31 DIAGNOSIS — R072 Precordial pain: Secondary | ICD-10-CM | POA: Diagnosis not present

## 2019-12-31 LAB — FERRITIN: Ferritin: 112 ng/mL (ref 24–336)

## 2019-12-31 LAB — CBC
HCT: 58.8 % — ABNORMAL HIGH (ref 39.0–52.0)
Hemoglobin: 18.8 g/dL — ABNORMAL HIGH (ref 13.0–17.0)
MCH: 30.2 pg (ref 26.0–34.0)
MCHC: 32 g/dL (ref 30.0–36.0)
MCV: 94.4 fL (ref 80.0–100.0)
Platelets: 130 10*3/uL — ABNORMAL LOW (ref 150–400)
RBC: 6.23 MIL/uL — ABNORMAL HIGH (ref 4.22–5.81)
RDW: 13.3 % (ref 11.5–15.5)
WBC: 7.9 10*3/uL (ref 4.0–10.5)
nRBC: 0 % (ref 0.0–0.2)

## 2019-12-31 LAB — D-DIMER, QUANTITATIVE: D-Dimer, Quant: 0.28 ug/mL-FEU (ref 0.00–0.50)

## 2019-12-31 LAB — COMPREHENSIVE METABOLIC PANEL
ALT: 19 U/L (ref 0–44)
AST: 20 U/L (ref 15–41)
Albumin: 3.8 g/dL (ref 3.5–5.0)
Alkaline Phosphatase: 53 U/L (ref 38–126)
Anion gap: 10 (ref 5–15)
BUN: 19 mg/dL (ref 8–23)
CO2: 23 mmol/L (ref 22–32)
Calcium: 8.2 mg/dL — ABNORMAL LOW (ref 8.9–10.3)
Chloride: 103 mmol/L (ref 98–111)
Creatinine, Ser: 0.96 mg/dL (ref 0.61–1.24)
GFR calc Af Amer: >60 mL/min (ref >60)
GFR calc non Af Amer: >60 mL/min (ref >60)
Glucose, Bld: 94 mg/dL (ref 70–99)
Potassium: 3.8 mmol/L (ref 3.5–5.1)
Sodium: 136 mmol/L (ref 135–145)
Total Bilirubin: 0.9 mg/dL (ref 0.3–1.2)
Total Protein: 6.9 g/dL (ref 6.5–8.1)

## 2019-12-31 LAB — ECHOCARDIOGRAM COMPLETE
Height: 67 in
Weight: 3188.73 oz

## 2019-12-31 LAB — HEPARIN LEVEL (UNFRACTIONATED)
Heparin Unfractionated: 0.65 IU/mL (ref 0.30–0.70)
Heparin Unfractionated: 1.09 IU/mL — ABNORMAL HIGH (ref 0.30–0.70)

## 2019-12-31 LAB — C-REACTIVE PROTEIN: CRP: 1.1 mg/dL — ABNORMAL HIGH (ref <1.0)

## 2019-12-31 LAB — MAGNESIUM: Magnesium: 2.2 mg/dL (ref 1.7–2.4)

## 2019-12-31 MED ORDER — ASPIRIN 81 MG PO TBEC: 81.0000 mg | DELAYED_RELEASE_TABLET | Freq: Every day | ORAL | Status: DC

## 2019-12-31 MED ORDER — ATORVASTATIN CALCIUM 40 MG PO TABS: 40.0000 mg | ORAL_TABLET | Freq: Every day | ORAL | 1 refills | Status: DC

## 2019-12-31 NOTE — Progress Notes (Signed)
Pt refused EKG. Pt states he is being discharged home and doesn't think the EKG is necessary. Raquel Sarna, RN aware

## 2019-12-31 NOTE — Progress Notes (Signed)
[place] for heparin  Indication: ACS  Allergies  Allergen Reactions  . Prednisone Other (See Comments)    Peptic ulcers      Patient Measurements: Height: 5\' 7"  (170.2 cm) Weight: 199 lb 4.7 oz (90.4 kg) IBW/kg (Calculated) : 66.1 Heparin Dosing Weight: 85 kg  Vital Signs: Temp: 98.7 F (37.1 C) (01/05 0500) Temp Source: Oral (01/05 0500) BP: 119/71 (01/05 0500) Pulse Rate: 71 (01/05 0500)  Labs: Recent Labs    12/29/19 1904 12/29/19 1904 12/29/19 2349 12/30/19 0002 12/30/19 1304 12/30/19 1957 12/31/19 0521  HGB 19.0*  --   --  18.7*  --   --  18.8*  HCT 58.6*  --   --  58.1*  --   --  58.8*  PLT 147*  --   --  130*  --   --  130*  HEPARINUNFRC  --    < >  --   --  0.67 0.78* 0.65  CREATININE 1.04  --  0.95  --   --   --  0.96   < > = values in this interval not displayed.    Estimated Creatinine Clearance: 86.6 mL/min (by C-G formula based on SCr of 0.96 mg/dL).  Assessment: 64 y.o. male with chest pain and elevated cardiac markers for heparin. Heparin level is therapeutic at 0.67 F/U results of ECHO  Heparin level therapeutic this AM at 0.65  Goal of Therapy:  Heparin level 0.3-0.7 units/mL Monitoring of platelets per anticoagulation protocol    Plan:  Continue heparin infusion at 1250 units/hr Check heparin level in anti-Xa in ~6-8 hours and daily Continue to monitor H&H and platelets  Isac Sarna, BS Vena [place], BCPS Clinical Pharmacist Pager [phone] 12/31/2019 8:12 AM

## 2019-12-31 NOTE — Plan of Care (Signed)
  Problem: Education: Goal: Knowledge of General Education information will improve Description: Including pain rating scale, medication(s)/side effects and non-pharmacologic comfort measures 12/31/2019 1241 by Melony Overly, RN Outcome: Adequate for Discharge 12/31/2019 1159 by Melony Overly, RN Outcome: Progressing   Problem: Health Behavior/Discharge Planning: Goal: Ability to manage health-related needs will improve 12/31/2019 1241 by Melony Overly, RN Outcome: Adequate for Discharge 12/31/2019 1159 by Melony Overly, RN Outcome: Progressing   Problem: Clinical Measurements: Goal: Ability to maintain clinical measurements within normal limits will improve 12/31/2019 1241 by Melony Overly, RN Outcome: Adequate for Discharge 12/31/2019 1159 by Melony Overly, RN Outcome: Progressing Goal: Will remain free from infection 12/31/2019 1241 by Melony Overly, RN Outcome: Adequate for Discharge 12/31/2019 1159 by Melony Overly, RN Outcome: Progressing Goal: Diagnostic test results will improve 12/31/2019 1241 by Melony Overly, RN Outcome: Adequate for Discharge 12/31/2019 1159 by Melony Overly, RN Outcome: Progressing Goal: Respiratory complications will improve 12/31/2019 1241 by Melony Overly, RN Outcome: Adequate for Discharge 12/31/2019 1159 by Melony Overly, RN Outcome: Progressing Goal: Cardiovascular complication will be avoided 12/31/2019 1241 by Melony Overly, RN Outcome: Adequate for Discharge 12/31/2019 1159 by Melony Overly, RN Outcome: Progressing   Problem: Activity: Goal: Risk for activity intolerance will decrease 12/31/2019 1241 by Melony Overly, RN Outcome: Adequate for Discharge 12/31/2019 1159 by Melony Overly, RN Outcome: Progressing   Problem: Nutrition: Goal: Adequate nutrition will be maintained 12/31/2019 1241 by Melony Overly, RN Outcome: Adequate for Discharge 12/31/2019 1159 by Melony Overly, RN Outcome: Progressing   Problem:  Coping: Goal: Level of anxiety will decrease 12/31/2019 1241 by Melony Overly, RN Outcome: Adequate for Discharge 12/31/2019 1159 by Melony Overly, RN Outcome: Progressing   Problem: Elimination: Goal: Will not experience complications related to bowel motility 12/31/2019 1241 by Melony Overly, RN Outcome: Adequate for Discharge 12/31/2019 1159 by Melony Overly, RN Outcome: Progressing Goal: Will not experience complications related to urinary retention 12/31/2019 1241 by Melony Overly, RN Outcome: Adequate for Discharge 12/31/2019 1159 by Melony Overly, RN Outcome: Progressing   Problem: Pain Managment: Goal: General experience of comfort will improve 12/31/2019 1241 by Melony Overly, RN Outcome: Adequate for Discharge 12/31/2019 1159 by Melony Overly, RN Outcome: Progressing   Problem: Safety: Goal: Ability to remain free from injury will improve 12/31/2019 1241 by Melony Overly, RN Outcome: Adequate for Discharge 12/31/2019 1159 by Melony Overly, RN Outcome: Progressing   Problem: Skin Integrity: Goal: Risk for impaired skin integrity will decrease 12/31/2019 1241 by Melony Overly, RN Outcome: Adequate for Discharge 12/31/2019 1159 by Melony Overly, RN Outcome: Progressing

## 2019-12-31 NOTE — Plan of Care (Signed)

## 2019-12-31 NOTE — Discharge Summary (Signed)
Physician Discharge Summary  Jeffrey Allen [id number] DOB: 19-Aug-1956 DOA: 12/29/2019  PCP: Doree Albee, MD  Admit date: 12/29/2019 Discharge date: 12/31/2019  Admitted From: Home Disposition:  Home   Recommendations for Outpatient Follow-up:  1. Follow up with PCP in 1-2 weeks 2. Please obtain BMP/CBC in one week    Discharge Condition: Stable CODE STATUS: FULL Diet recommendation: Heart Healthy   Brief/Interim Summary: 64 year old male with a history of hypertension, stroke, GERD, hyperlipidemia presenting with 4-day history of intermittent epigastric pain and substernal chest discomfort.  He stated that his abdominal pain would migrate to his lower abdomen and subsequently wrapped around to his lower back and occasionally go down to his buttocks.  He stated that it was moderate in intensity, but not worsened with exertion.  He denied any fevers, chills, coughing, shortness of breath, hemoptysis, nausea, vomiting, diarrhea.  There is no dysuria or hematuria. In the emergency department, the patient had temperature 99.1 F but was hemodynamically stable.  Oxygen saturation was 96% on room air.  BMP and LFTs were unremarkable.  CBC showed platelets of 130,000.  His SARS-CoV2 antigen assay came back positive.  Troponin was 145>>> 148. ED provider spoke with cardiology who recommended patient be transferred to [facility] for further evaluation.  Chest x-ray shows mild increased interstitial markings without any consolidations. Cardiology at [facility] was consulted.  Transfer to [facility] was ultimately cancelled as pt's pain appeared more atypical  Discharge Diagnoses:  Chest pain -More atypical by clinical history -Troponins minimally elevated -ED provider spoke with cardiology who recommended transfer to [facility] -Start aspirin -Continue statin -Personally reviewed chest x-ray--slight increased interstitial markings without consolidation -Personally reviewed EKG--sinus rhythm,  nonspecific T wave change -cardiology consult appreciated-->with reassuring echo-->ok for d/c with outpt follow up and stress test later  COVID-19 infection -The patient is essentially asymptomatic from a pulmonary standpoint without hypoxia or dyspnea -No indication to start steroids or remdesivir -CRP 0.7 -Ferritin 69 -D-dimer 0.54 -Lactic acid 1.4 -PCT<0.10  Hyperlipidemia -LDL 146 -d/c home with lipitor 40 mg daily  Gouty arthritis -Continue allopurinol  Impaired glucose tolerance -Patient is not on any agents outpatient -12/29/19 A1c--5.6  History of Stroke -was not taking ASA  -restart -continue statin  Abdominal pain -resolved at time of my evaluation -LFTs normal -continue PPI    Discharge Instructions  Discharge Instructions    MyChart COVID-19 home monitoring program   Complete by: Dec 31, 2019    Is the patient willing to use the [place] for home monitoring?: Yes   Temperature monitoring   Complete by: Dec 31, 2019    After how many days would you like to receive a notification of this patient's flowsheet entries?: 1     Allergies as of 12/31/2019      Reactions   Prednisone Other (See Comments)   Peptic ulcers      Medication List    TAKE these medications   allopurinol 300 MG tablet Commonly known as: ZYLOPRIM Take 0.5 tablets (150 mg total) by mouth daily.   aspirin 81 MG EC tablet Take 1 tablet (81 mg total) by mouth daily. Start taking on: January 01, 2020   atorvastatin 40 MG tablet Commonly known as: LIPITOR Take 1 tablet (40 mg total) by mouth daily at 6 PM.   omeprazole 10 MG capsule Commonly known as: PRILOSEC Take 10 mg by mouth daily.   Testosterone Cypionate 200 MG/ML Soln Inject 100 mg as directed once a week.  VITAMIN C PO Take 2 tablets by mouth daily.   Vitamin D3 125 MCG (5000 UT) Tabs Take 2 tablets by mouth daily.      Follow-up Information    Erma Heritage, PA-C Follow up.     Specialties: Physician Assistant, Cardiology Why: Cardiology Hospital Follow-up on 01/28/2020 at 2:30 PM.  Contact information: [address] [place] [address] [phone]          Allergies  Allergen Reactions  . Prednisone Other (See Comments)    Peptic ulcers      Consultations:  cardiology   Procedures/Studies: DG Chest Port 1 View  Result Date: 12/29/2019 CLINICAL DATA:  Chest pain EXAM: PORTABLE CHEST 1 VIEW COMPARISON:  None. FINDINGS: The heart size and mediastinal contours are within normal limits. Both lungs are clear. The visualized skeletal structures are unremarkable. IMPRESSION: No active disease. Electronically Signed   By: Inez Catalina M.D.   On: 12/29/2019 19:00   ECHOCARDIOGRAM COMPLETE  Result Date: 12/31/2019   ECHOCARDIOGRAM REPORT   Patient Name:   Jeffrey Allen Date of Exam: 12/30/2019 Medical Rec #:  [id number]       Height:       67.0 in Accession #:    [id number]      Weight:       199.3 lb Date of Birth:  12-Mar-1956       BSA:          2.02 m Patient Age:    64 years        BP:           119/71 mmHg Patient Gender: M               HR:           71 bpm. Exam Location:  [facility] Procedure: 2D Echo, Cardiac Doppler and Color Doppler Indications:    NSTEMI l21.4  History:        Patient has prior history of Echocardiogram examinations, most                 recent 06/19/2018. Risk Factors:Hypertension, Diabetes and                 Prediabetes. COVID-19 virus infection,Acute CVA (cerebrovascular                 accident),NSTEMI (non-ST elevated myocardial infarction).  Sonographer:    Alvino Chapel RCS Referring Phys: [id number] [place]  1. Left ventricular ejection fraction, by visual estimation, is approximately 55%. The left ventricle has normal function. There is borderline left ventricular hypertrophy.  2. Left ventricular diastolic parameters are indeterminate.  3. The left ventricle has no regional wall motion abnormalities.  4. Global  right ventricle has normal systolic function.The right ventricular size is normal. No increase in right ventricular wall thickness.  5. Left atrial size was mildly dilated.  6. Right atrial size was normal.  7. The mitral valve is grossly normal. Mild mitral valve regurgitation.  8. The tricuspid valve is grossly normal.  9. The aortic valve is tricuspid. Aortic valve regurgitation is not visualized. 10. The pulmonic valve was grossly normal. Pulmonic valve regurgitation is trivial. 11. TR signal is inadequate for assessing pulmonary artery systolic pressure. 12. The inferior vena cava is normal in size with greater than 50% respiratory variability, suggesting right atrial pressure of 3 mmHg. FINDINGS  Left Ventricle: Left ventricular ejection fraction, by visual estimation, is 55%. The left ventricle has  normal function. The left ventricle has no regional wall motion abnormalities. The left ventricular internal cavity size was the left ventricle is normal in size. There is borderline left ventricular hypertrophy. Left ventricular diastolic parameters are indeterminate. Right Ventricle: The right ventricular size is normal. No increase in right ventricular wall thickness. Global RV systolic function is has normal systolic function. Left Atrium: Left atrial size was mildly dilated. Right Atrium: Right atrial size was normal in size Pericardium: There is no evidence of pericardial effusion. Mitral Valve: The mitral valve is grossly normal. Mild mitral valve regurgitation. Tricuspid Valve: The tricuspid valve is grossly normal. Tricuspid valve regurgitation is trivial. Aortic Valve: The aortic valve is tricuspid. Aortic valve regurgitation is not visualized. Pulmonic Valve: The pulmonic valve was grossly normal. Pulmonic valve regurgitation is trivial. Pulmonic regurgitation is trivial. Aorta: The aortic root is normal in size and structure. Venous: The inferior vena cava is normal in size with greater than 50%  respiratory variability, suggesting right atrial pressure of 3 mmHg. IAS/Shunts: No atrial level shunt detected by color flow Doppler.  LEFT VENTRICLE PLAX 2D LVIDd:         5.12 cm       Diastology LVIDs:         3.47 cm       LV e' lateral:   9.57 cm/s LV PW:         0.92 cm       LV E/e' lateral: 8.9 LV IVS:        1.04 cm       LV e' medial:    6.53 cm/s LVOT diam:     1.90 cm       LV E/e' medial:  13.1 LV SV:         75 ml LV SV Index:   35.84 LVOT Area:     2.84 cm  LV Volumes (MOD) LV area d, A2C:    27.10 cm LV area d, A4C:    32.30 cm LV area s, A2C:    15.00 cm LV area s, A4C:    17.90 cm LV major d, A2C:   7.61 cm LV major d, A4C:   8.61 cm LV major s, A2C:   6.02 cm LV major s, A4C:   6.87 cm LV vol d, MOD A2C: 80.7 ml LV vol d, MOD A4C: 99.0 ml LV vol s, MOD A2C: 32.7 ml LV vol s, MOD A4C: 39.2 ml LV SV MOD A2C:     48.0 ml LV SV MOD A4C:     99.0 ml LV SV MOD BP:      56.9 ml RIGHT VENTRICLE RV S prime:     14.80 cm/s TAPSE (M-mode): 2.1 cm LEFT ATRIUM             Index       RIGHT ATRIUM           Index LA diam:        3.70 cm 1.83 cm/m  RA Area:     17.20 cm LA Vol (A2C):   84.9 ml 42.05 ml/m RA Volume:   49.70 ml  24.61 ml/m LA Vol (A4C):   70.4 ml 34.86 ml/m LA Biplane Vol: 80.4 ml 39.82 ml/m  AORTIC VALVE LVOT Vmax:   120.00 cm/s LVOT Vmean:  81.400 cm/s LVOT VTI:    0.218 m  AORTA Ao Root diam: 3.20 cm MITRAL VALVE MV Area (PHT): 3.77 cm  SHUNTS MV PHT:        58.29 msec           Systemic VTI:  0.22 m MV Decel Time: 201 msec             Systemic Diam: 1.90 cm MV E velocity: 85.30 cm/s 103 cm/s MV A velocity: 89.10 cm/s 70.3 cm/s MV E/A ratio:  0.96       1.5  Rozann Lesches MD Electronically signed by Rozann Lesches MD Signature Date/Time: 12/31/2019/11:22:10 AM    Final         Discharge Exam: Vitals:   12/30/19 2220 12/31/19 0500  BP: (!) 144/88 119/71  Pulse: 70 71  Resp: 20 20  Temp: 99.3 F (37.4 C) 98.7 F (37.1 C)  SpO2: 95% 96%   Vitals:    12/30/19 2218 12/30/19 2220 12/31/19 0500 12/31/19 0500  BP: (!) 144/88 (!) 144/88  119/71  Pulse: 69 70  71  Resp:  20  20  Temp:  99.3 F (37.4 C)  98.7 F (37.1 C)  TempSrc:  Oral  Oral  SpO2:  95%  96%  Weight:   90.4 kg   Height:        General: Pt is alert, awake, not in acute distress Cardiovascular: RRR, S1/S2 +, no rubs, no gallops Respiratory: CTA bilaterally, no wheezing, no rhonchi Abdominal: Soft, NT, ND, bowel sounds + Extremities: no edema, no cyanosis   The results of significant diagnostics from this hospitalization (including imaging, microbiology, ancillary and laboratory) are listed below for reference.    Significant Diagnostic Studies: DG Chest Port 1 View  Result Date: 12/29/2019 CLINICAL DATA:  Chest pain EXAM: PORTABLE CHEST 1 VIEW COMPARISON:  None. FINDINGS: The heart size and mediastinal contours are within normal limits. Both lungs are clear. The visualized skeletal structures are unremarkable. IMPRESSION: No active disease. Electronically Signed   By: Inez Catalina M.D.   On: 12/29/2019 19:00   ECHOCARDIOGRAM COMPLETE  Result Date: 12/31/2019   ECHOCARDIOGRAM REPORT   Patient Name:   Jeffrey Allen Date of Exam: 12/30/2019 Medical Rec #:  [id number]       Height:       67.0 in Accession #:    [id number]      Weight:       199.3 lb Date of Birth:  Aug 16, 1956       BSA:          2.02 m Patient Age:    53 years        BP:           119/71 mmHg Patient Gender: M               HR:           71 bpm. Exam Location:  [facility] Procedure: 2D Echo, Cardiac Doppler and Color Doppler Indications:    NSTEMI l21.4  History:        Patient has prior history of Echocardiogram examinations, most                 recent 06/19/2018. Risk Factors:Hypertension, Diabetes and                 Prediabetes. COVID-19 virus infection,Acute CVA (cerebrovascular                 accident),NSTEMI (non-ST elevated myocardial infarction).  Sonographer:    Alvino Chapel RCS Referring Phys: [id number]  [place]  1. Left ventricular  ejection fraction, by visual estimation, is approximately 55%. The left ventricle has normal function. There is borderline left ventricular hypertrophy.  2. Left ventricular diastolic parameters are indeterminate.  3. The left ventricle has no regional wall motion abnormalities.  4. Global right ventricle has normal systolic function.The right ventricular size is normal. No increase in right ventricular wall thickness.  5. Left atrial size was mildly dilated.  6. Right atrial size was normal.  7. The mitral valve is grossly normal. Mild mitral valve regurgitation.  8. The tricuspid valve is grossly normal.  9. The aortic valve is tricuspid. Aortic valve regurgitation is not visualized. 10. The pulmonic valve was grossly normal. Pulmonic valve regurgitation is trivial. 11. TR signal is inadequate for assessing pulmonary artery systolic pressure. 12. The inferior vena cava is normal in size with greater than 50% respiratory variability, suggesting right atrial pressure of 3 mmHg. FINDINGS  Left Ventricle: Left ventricular ejection fraction, by visual estimation, is 55%. The left ventricle has normal function. The left ventricle has no regional wall motion abnormalities. The left ventricular internal cavity size was the left ventricle is normal in size. There is borderline left ventricular hypertrophy. Left ventricular diastolic parameters are indeterminate. Right Ventricle: The right ventricular size is normal. No increase in right ventricular wall thickness. Global RV systolic function is has normal systolic function. Left Atrium: Left atrial size was mildly dilated. Right Atrium: Right atrial size was normal in size Pericardium: There is no evidence of pericardial effusion. Mitral Valve: The mitral valve is grossly normal. Mild mitral valve regurgitation. Tricuspid Valve: The tricuspid valve is grossly normal. Tricuspid valve regurgitation is trivial. Aortic Valve:  The aortic valve is tricuspid. Aortic valve regurgitation is not visualized. Pulmonic Valve: The pulmonic valve was grossly normal. Pulmonic valve regurgitation is trivial. Pulmonic regurgitation is trivial. Aorta: The aortic root is normal in size and structure. Venous: The inferior vena cava is normal in size with greater than 50% respiratory variability, suggesting right atrial pressure of 3 mmHg. IAS/Shunts: No atrial level shunt detected by color flow Doppler.  LEFT VENTRICLE PLAX 2D LVIDd:         5.12 cm       Diastology LVIDs:         3.47 cm       LV e' lateral:   9.57 cm/s LV PW:         0.92 cm       LV E/e' lateral: 8.9 LV IVS:        1.04 cm       LV e' medial:    6.53 cm/s LVOT diam:     1.90 cm       LV E/e' medial:  13.1 LV SV:         75 ml LV SV Index:   35.84 LVOT Area:     2.84 cm  LV Volumes (MOD) LV area d, A2C:    27.10 cm LV area d, A4C:    32.30 cm LV area s, A2C:    15.00 cm LV area s, A4C:    17.90 cm LV major d, A2C:   7.61 cm LV major d, A4C:   8.61 cm LV major s, A2C:   6.02 cm LV major s, A4C:   6.87 cm LV vol d, MOD A2C: 80.7 ml LV vol d, MOD A4C: 99.0 ml LV vol s, MOD A2C: 32.7 ml LV vol s, MOD A4C: 39.2 ml LV SV MOD A2C:  48.0 ml LV SV MOD A4C:     99.0 ml LV SV MOD BP:      56.9 ml RIGHT VENTRICLE RV S prime:     14.80 cm/s TAPSE (M-mode): 2.1 cm LEFT ATRIUM             Index       RIGHT ATRIUM           Index LA diam:        3.70 cm 1.83 cm/m  RA Area:     17.20 cm LA Vol (A2C):   84.9 ml 42.05 ml/m RA Volume:   49.70 ml  24.61 ml/m LA Vol (A4C):   70.4 ml 34.86 ml/m LA Biplane Vol: 80.4 ml 39.82 ml/m  AORTIC VALVE LVOT Vmax:   120.00 cm/s LVOT Vmean:  81.400 cm/s LVOT VTI:    0.218 m  AORTA Ao Root diam: 3.20 cm MITRAL VALVE MV Area (PHT): 3.77 cm             SHUNTS MV PHT:        58.29 msec           Systemic VTI:  0.22 m MV Decel Time: 201 msec             Systemic Diam: 1.90 cm MV E velocity: 85.30 cm/s 103 cm/s MV A velocity: 89.10 cm/s 70.3 cm/s MV E/A ratio:   0.96       1.5  Rozann Lesches MD Electronically signed by Rozann Lesches MD Signature Date/Time: 12/31/2019/11:22:10 AM    Final      Microbiology: Recent Results (from the past 240 hour(s))  Blood Culture (routine x 2)     Status: None (Preliminary result)   Collection Time: 12/29/19 11:49 PM   Specimen: Left Antecubital; Blood  Result Value Ref Range Status   Specimen Description LEFT ANTECUBITAL  Final   Special Requests   Final    BOTTLES DRAWN AEROBIC AND ANAEROBIC Blood Culture adequate volume   Culture   Final    NO GROWTH 1 DAY Performed at [facility], [address], [place], [place] [address]    Report Status PENDING  Incomplete  Blood Culture (routine x 2)     Status: None (Preliminary result)   Collection Time: 12/30/19 12:02 AM   Specimen: BLOOD RIGHT HAND  Result Value Ref Range Status   Specimen Description BLOOD RIGHT HAND  Final   Special Requests   Final    BOTTLES DRAWN AEROBIC AND ANAEROBIC Blood Culture adequate volume   Culture   Final    NO GROWTH 1 DAY Performed at [facility], [address] [place], [place] [address]    Report Status PENDING  Incomplete     Labs: Basic Metabolic Panel: Recent Labs  Lab 12/29/19 1904 12/29/19 2349 12/31/19 0521  NA 136 137 136  K 3.9 3.9 3.8  CL 102 106 103  CO2 26 22 23   GLUCOSE 95 89 94  BUN 16 16 19   CREATININE 1.04 0.95 0.96  CALCIUM 8.1* 7.9* 8.2*  MG  --   --  2.2   Liver Function Tests: Recent Labs  Lab 12/29/19 1904 12/29/19 2349 12/31/19 0521  AST 21 23 20   ALT 21 20 19   ALKPHOS 56 53 53  BILITOT 0.4 0.7 0.9  PROT 7.0 6.8 6.9  ALBUMIN 4.0 3.8 3.8   Recent Labs  Lab 12/29/19 1904  LIPASE 23   No results for input(s): AMMONIA in the last 168  hours. CBC: Recent Labs  Lab 12/29/19 1904 12/30/19 0002 12/31/19 0521  WBC 9.2 8.5 7.9  HGB 19.0* 18.7* 18.8*  HCT 58.6* 58.1* 58.8*  MCV 94.7 94.9 94.4  PLT 147* 130* 130*   Cardiac Enzymes: No results for input(s): CKTOTAL,  CKMB, CKMBINDEX, TROPONINI in the last 168 hours. BNP: Invalid input(s): POCBNP CBG: No results for input(s): GLUCAP in the last 168 hours.  Time coordinating discharge:  36 minutes  Signed:  Orson Eva, DO [facility] Pager: [phone] 12/31/2019, 12:38 PM

## 2019-12-31 NOTE — Progress Notes (Addendum)
     Reviewed with Dr. Domenic Polite. Patient currently admitted for COVID-19 and HS Troponin values found to be elevated to 148. Echocardiogram is pending to evaluate LV function. If LVEF is normal and no new WMA identified, no further cardiac testing planned this admission. Echocardiogram to be performed this AM. Will arrange follow-up. If echocardiogram is abnormal, will provide additional recommendations at that time.   Signed, Erma [facility], PA-C 12/31/2019, 9:46 AM Pager: [phone]   Echocardiogram shows LVEF 55% without wall motion abnormalities, PVCs noted.  No pericardial effusion and normal RV contraction.  At this point no further inpatient cardiac work-up planned.  We will see him back in the office after recovery from COVID-19 and can follow-up with ischemic evaluation based on risk factor profile.  [name], M.D., F.A.C.C.

## 2019-12-31 NOTE — Progress Notes (Signed)
*  PRELIMINARY RESULTS* Echocardiogram 2D Echocardiogram has been performed.  Jeffrey Allen 12/31/2019, 11:06 AM

## 2020-01-04 LAB — CULTURE, BLOOD (ROUTINE X 2)
Culture: NO GROWTH
Culture: NO GROWTH
Special Requests: ADEQUATE
Special Requests: ADEQUATE

## 2020-01-06 ENCOUNTER — Encounter (INDEPENDENT_AMBULATORY_CARE_PROVIDER_SITE_OTHER): Payer: Self-pay | Admitting: Internal Medicine

## 2020-01-06 ENCOUNTER — Ambulatory Visit (INDEPENDENT_AMBULATORY_CARE_PROVIDER_SITE_OTHER): Payer: 59 | Admitting: Internal Medicine

## 2020-01-06 DIAGNOSIS — R059 Cough, unspecified: Secondary | ICD-10-CM

## 2020-01-06 DIAGNOSIS — R05 Cough: Secondary | ICD-10-CM | POA: Diagnosis not present

## 2020-01-06 DIAGNOSIS — U071 COVID-19: Secondary | ICD-10-CM | POA: Diagnosis not present

## 2020-01-06 MED ORDER — GUAIFENESIN-CODEINE 100-10 MG/5ML PO SYRP: 5.0000 mL | ORAL_SOLUTION | Freq: Three times a day (TID) | ORAL | 0 refills | Status: DC | PRN

## 2020-01-06 NOTE — Progress Notes (Signed)
Metrics: Intervention Frequency ACO  Documented Smoking Status Yearly  Screened one or more times in 24 months  Cessation Counseling or  Active cessation medication Past 24 months  Past 24 months   Guideline developer: UpToDate (See UpToDate for funding source) Date Released: 2014       Wellness Office Visit  Subjective:  Patient ID: Jeffrey Allen, male    DOB: 1956/06/25  Age: 64 y.o. MRN: [id number]  CC: This is an audio telemedicine with the permission of the patient who is at home and I am in my office. I was able to easily recognize his voice on the phone. Chief complaint is cough. HPI  The patient was hospitalized approximately 8 days ago and spent a couple of days in the hospital with chest pain which was atypical and troponins were insignificantly raised.  He is due to have a stress test as an outpatient.  However, he was diagnosed with COVID-19 disease in the hospital and thankfully did not require any specific treatment such as steroids or remdesivir.  He was then discharged and he is now left with a irritating dry cough. He also was sent home with statin therapy but he cannot tolerate this at all. Past Medical History:  Diagnosis Date  . GERD (gastroesophageal reflux disease)   . Gout   . Obesity (BMI 30.0-34.9)   . Peptic ulcer disease   . Prediabetes   . Stroke ([place])   . Testicular failure       Family History  Problem Relation Age of Onset  . Diabetes Father   . 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   Social History   Tobacco Use  . Smoking status: Former Smoker    Packs/day: 0.02    Years: 25.00    Pack years: 0.50    Types: Cigarettes    Quit date: 12/26/2006    Years since quitting: 13.0  . Smokeless tobacco: Never Used  Substance Use Topics  . Alcohol use: Yes    Alcohol/week: 14.0 standard drinks    Types: 14 Cans of beer per week    Current Meds    Medication Sig  . allopurinol (ZYLOPRIM) 300 MG tablet Take 0.5 tablets (150 mg total) by mouth daily.  . Ascorbic Acid (VITAMIN C PO) Take 2 tablets by mouth daily.  Marland Kitchen aspirin EC 81 MG EC tablet Take 1 tablet (81 mg total) by mouth daily.  Marland Kitchen atorvastatin (LIPITOR) 40 MG tablet Take 1 tablet (40 mg total) by mouth daily at 6 PM.  . Cholecalciferol (VITAMIN D3) 125 MCG (5000 UT) TABS Take 2 tablets by mouth daily.  Marland Kitchen omeprazole (PRILOSEC) 10 MG capsule Take 10 mg by mouth daily.   . Testosterone Cypionate 200 MG/ML SOLN Inject 100 mg as directed once a week.       Objective:   Today's Vitals: There were no vitals taken for this visit. Vitals with BMI 12/31/2019 12/30/2019 12/30/2019  Height - - -  Weight 199 lbs 5 oz - -  BMI 0000000 - -  Systolic [id number] [id number] [id number]  Diastolic 71 88 88  Pulse 71 70 69     Physical Exam  He is alert and orientated on the phone but is coughing intermittently during the conversation.     Assessment   1. COVID-19   2. Cough       Tests ordered No  orders of the defined types were placed in this encounter.    Plan: 1. I have sent a prescription for Robitussin with codeine for symptomatic relief.  I do not have any other specific recommendations for COVID-19 disease as he has not deteriorated.  If he does, he will let me know. 2. I have told him he can hold off on statin therapy for the time being as he does not tolerate this at all and we will discuss this on the next visit in February.  In the meantime, I have encouraged him to make sure he does  get the stress test done.   Meds ordered this encounter  Medications  . guaiFENesin-codeine (ROBITUSSIN AC) 100-10 MG/5ML syrup    Sig: Take 5 mLs by mouth 3 (three) times daily as needed for cough.    Dispense:  120 mL    Refill:  0    Rayneisha Bouza C Nichollas Perusse, MD  TIME SPENT: 13 minutes

## 2020-01-28 ENCOUNTER — Other Ambulatory Visit: Payer: Self-pay

## 2020-01-28 ENCOUNTER — Encounter: Payer: Self-pay | Admitting: Student

## 2020-01-28 ENCOUNTER — Ambulatory Visit (INDEPENDENT_AMBULATORY_CARE_PROVIDER_SITE_OTHER): Payer: 59 | Admitting: Student

## 2020-01-28 VITALS — BP 127/82 | HR 73 | Temp 99.3°F | Ht 67.0 in | Wt 199.0 lb

## 2020-01-28 DIAGNOSIS — Z8616 Personal history of COVID-19: Secondary | ICD-10-CM

## 2020-01-28 DIAGNOSIS — R0600 Dyspnea, unspecified: Secondary | ICD-10-CM | POA: Diagnosis not present

## 2020-01-28 DIAGNOSIS — Z8679 Personal history of other diseases of the circulatory system: Secondary | ICD-10-CM | POA: Diagnosis not present

## 2020-01-28 DIAGNOSIS — E785 Hyperlipidemia, unspecified: Secondary | ICD-10-CM | POA: Diagnosis not present

## 2020-01-28 DIAGNOSIS — R0609 Other forms of dyspnea: Secondary | ICD-10-CM

## 2020-01-28 DIAGNOSIS — R778 Other specified abnormalities of plasma proteins: Secondary | ICD-10-CM

## 2020-01-28 NOTE — Progress Notes (Signed)
Cardiology Office Note    Date:  01/28/2020   ID:  Zyere, Zuk [birth year], MRN [id number]  PCP:  Doree Albee, MD  Cardiologist: Rozann Lesches, MD    Chief Complaint  Patient presents with  . Hospitalization Follow-up    History of Present Illness:    Jeffrey Allen is a 64 y.o. male with past medical history of HTN, HLD, prediabetes and prior CVA (in 05/2018) who presents to the office today for hospital follow-up.   He most recently presented to [facility] ED on 12/29/2019 for evaluation of sharp pain along his left flank and left costal region. Was found to be positive for COVID-19 but overall asymptomatic. HS Troponin values were found to be elevated to 145 and 148 but EKG showed no acute ischemic changes. An echocardiogram was obtained and showed a preserved EF of 55% with no wall motion abnormalities. He did have mild mitral valve regurgitation and trivial pulmonic valve regurgitation. No further inpatient cardiac testing was pursued and it was recommended to discuss ischemic testing as an outpatient once recovered from COVID-19.  In talking with the patient today, he reports initially having weakness and a decreased appetite following his hospitalization but says this has improved over the past few weeks. He never experienced any significant change in his breathing and denies any fever or chills.  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 He reports having dyspnea on exertion when walking up inclines but says this has been occurring for several years. Denies any associated exertional chest pain. No recent palpitations, orthopnea, PND or lower extremity edema.  Past Medical History:  Diagnosis Date  . GERD (gastroesophageal reflux disease)   . Gout   . Obesity (BMI 30.0-34.9)   . Peptic ulcer disease   . Prediabetes   . Stroke [facility])     Past Surgical History:  Procedure Laterality Date  . COLONOSCOPY    . COLONOSCOPY  N/A 12/07/2016   Procedure: COLONOSCOPY;  Surgeon: Daneil Dolin, MD;  Location: [facility] ENDO SUITE;  Service: Endoscopy;  Laterality: N/A;  9:15 AM    Current Medications: Outpatient Medications Prior to Visit  Medication Sig Dispense Refill  . allopurinol (ZYLOPRIM) 300 MG tablet Take 0.5 tablets (150 mg total) by mouth daily. 30 tablet 2  . Ascorbic Acid (VITAMIN C PO) Take 2 tablets by mouth daily.    Marland Kitchen aspirin EC 81 MG EC tablet Take 1 tablet (81 mg total) by mouth daily.    . Cholecalciferol (VITAMIN D3) 125 MCG (5000 UT) TABS Take 2 tablets by mouth daily.    Marland Kitchen omeprazole (PRILOSEC) 10 MG capsule Take 10 mg by mouth daily.     . Red Yeast Rice Extract (RED YEAST RICE PO) Take by mouth.    . Testosterone Cypionate 200 MG/ML SOLN Inject 100 mg as directed once a week. 10 mL 1  . atorvastatin (LIPITOR) 40 MG tablet Take 1 tablet (40 mg total) by mouth daily at 6 PM. 30 tablet 1  . guaiFENesin-codeine (ROBITUSSIN AC) 100-10 MG/5ML syrup Take 5 mLs by mouth 3 (three) times daily as needed for cough. 120 mL 0   No facility-administered medications prior to visit.     Allergies:   Prednisone   Social History   Socioeconomic History  . Marital status: Married    Spouse name: Not on file  . Number of children: Not on file  . Years of education:  Not on file  . Highest education level: Not on file  Occupational History  . Not on file  Tobacco Use  . Smoking status: Former Smoker    Packs/day: 0.02    Years: 25.00    Pack years: 0.50    Types: Cigarettes    Quit date: 12/26/2006    Years since quitting: 13.0  . Smokeless tobacco: Never Used  Substance and Sexual Activity  . Alcohol use: Yes    Alcohol/week: 14.0 standard drinks    Types: 14 Cans of beer per week  . Drug use: No  . Sexual activity: Not on file  Other Topics Concern  . 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   Social Determinants of Health    Financial Resource Strain:   . Difficulty of Paying Living Expenses: Not on file  Food Insecurity:   . Worried About Charity fundraiser in the Last Year: Not on file  . Ran Out of Food in the Last Year: Not on file  Transportation Needs:   . Lack of Transportation (Medical): Not on file  . Lack of Transportation (Non-Medical): Not on file  Physical Activity:   . Days of Exercise per Week: Not on file  . Minutes of Exercise per Session: Not on file  Stress:   . Feeling of Stress : Not on file  Social Connections:   . Frequency of Communication with Friends and Family: Not on file  . Frequency of Social Gatherings with Friends and Family: Not on file  . Attends Religious Services: Not on file  . Active Member of Clubs or Organizations: Not on file  . Attends Archivist Meetings: Not on file  . Marital Status: Not on file     Family History:  The patient's family history includes Diabetes in his father.   Review of Systems:   Please see the history of present illness.     General:  No chills, fever, night sweats or weight changes. Positive for changes in appetite.  Cardiovascular:  No chest pain, dyspnea on exertion, edema, orthopnea, palpitations, paroxysmal nocturnal dyspnea. Dermatological: No rash, lesions/masses Respiratory: No cough, dyspnea Urologic: No hematuria, dysuria Abdominal:   No nausea, vomiting, diarrhea, bright red blood per rectum, melena, or hematemesis Neurologic:  No visual changes, wkns, changes in mental status.  All other systems reviewed and are otherwise negative except as noted above.   Physical Exam:    VS:  BP 127/82   Pulse 73   Temp 99.3 F (37.4 C)   Ht 5\' 7"  (1.702 m)   Wt 199 lb (90.3 kg)   SpO2 95%   BMI 31.17 kg/m    General: Well developed, well nourished,male appearing in no acute distress. Head: Normocephalic, atraumatic, sclera non-icteric, no xanthomas, nares are without discharge.  Neck: No carotid bruits. JVD  not elevated.  Lungs: Respirations regular and unlabored, without wheezes or rales.  Heart: Regular rate and rhythm. No S3 or S4.  No murmur, no rubs, or gallops appreciated. Abdomen: Soft, non-tender, non-distended with normoactive bowel sounds. No hepatomegaly. No rebound/guarding. No obvious abdominal masses. Msk:  Strength and tone appear normal for age. No joint deformities or effusions. Extremities: No clubbing or cyanosis. No lower extremity edema.  Distal pedal pulses are 2+ bilaterally. Neuro: Alert and oriented X 3. Moves all extremities spontaneously. No focal deficits noted. Psych:  Responds to questions appropriately with a normal affect. Skin:  No rashes or lesions noted  Wt Readings from Last 3 Encounters:  01/28/20 199 lb (90.3 kg)  12/31/19 199 lb 4.7 oz (90.4 kg)  11/07/19 209 lb (94.8 kg)     Studies/Labs Reviewed:   EKG:  EKG is not ordered today.  EKG from 12/29/2019 is reviewed which shows NSR, HR 76 with PVC's and LAD. No acute ST abnormality when compared to prior tracings.   Recent Labs: 12/31/2019: ALT 19; BUN 19; Creatinine, Ser 0.96; Hemoglobin 18.8; Magnesium 2.2; Platelets 130; Potassium 3.8; Sodium 136   Lipid Panel    Component Value Date/Time   CHOL 202 (H) 12/30/2019 0002   TRIG 103 12/30/2019 0002   HDL 35 (L) 12/30/2019 0002   CHOLHDL 5.8 12/30/2019 0002   VLDL 21 12/30/2019 0002   LDLCALC 146 (H) 12/30/2019 0002   LDLCALC 138 (H) 11/07/2019 1057    Additional studies/ records that were reviewed today include:   Echocardiogram: 12/31/2019 IMPRESSIONS    1. Left ventricular ejection fraction, by visual estimation, is  approximately 55%. The left ventricle has normal function. There is  borderline left ventricular hypertrophy.  2. Left ventricular diastolic parameters are indeterminate.  3. The left ventricle has no regional wall motion abnormalities.  4. Global right ventricle has normal systolic function.The right  ventricular size is  normal. No increase in right ventricular wall  thickness.  5. Left atrial size was mildly dilated.  6. Right atrial size was normal.  7. The mitral valve is grossly normal. Mild mitral valve regurgitation.  8. The tricuspid valve is grossly normal.  9. The aortic valve is tricuspid. Aortic valve regurgitation is not  visualized.  10. The pulmonic valve was grossly normal. Pulmonic valve regurgitation is  trivial.  11. TR signal is inadequate for assessing pulmonary artery systolic  pressure.  12. The inferior vena cava is normal in size with greater than 50%  respiratory variability, suggesting right atrial pressure of 3 mmHg.  Assessment:    1. DOE (dyspnea on exertion)   2. Elevated troponin   3. Hyperlipidemia LDL goal <70   4. History of hypertension   5. History of COVID-19      Plan:   In order of problems listed above:  1. Dyspnea on Exertion/Elevated Troponin Values - HS Troponin values peaked at 148 during admission and was thought to be secondary to demand ischemia in the setting of his COVID-19 diagnosis. Echocardiogram showed a preserved EF with no wall motion abnormalities as outlined above. He does have cardiac risk factors and is in agreement to pursue stress testing as discussed during admission. Will plan for a Lexiscan Myoview for further evaluation. Given the closure of nuclear medicine here at [facility] for the next several weeks, he is in agreement to wait to have this performed in 02/2020. I think this is fine given no recent symptoms and he was encouraged to call our office if symptoms change in the interim and this can be performed at one of our [place] locations.  2. HLD - FLP in 12/2019 showed total cholesterol 202, HDL 35, triglycerides 103 and LDL 146. He was started on Atorvastatin 40 mg daily during admission but this was discontinued by his PCP in the interim due to not tolerating statins in the past with plans to readdress at follow-up. Would  consider trying low-dose Crestor 5 mg daily when discussed with his PCP at follow-up and titrate as symptoms allow. Goal LDL is less than 70 in the setting of  his prior CVA.   3. History of HTN - This is listed in his past medical history but he is not currently on antihypertensive medications. BP is well controlled at 127/82 during today's visit.   4. History of COVID-19 - Reported weakness and a decreased appetite but he did not experience any significant changes in his respiratory status. Echocardiogram during recent admission showed no evidence of a cardiomyopathy.   Medication Adjustments/Labs and Tests Ordered: Current medicines are reviewed at length with the patient today.  Concerns regarding medicines are outlined above.  Medication changes, Labs and Tests ordered today are listed in the Patient Instructions below. Patient Instructions  Medication Instructions:  Your physician recommends that you continue on your current medications as directed. Please refer to the Current Medication list given to you today.  *If you need a refill on your cardiac medications before your next appointment, please call your pharmacy*  Lab Work: NONE If you have labs (blood work) drawn today and your tests are completely normal, you will receive your results only by: Marland Kitchen MyChart Message (if you have MyChart) OR . A paper copy in the mail If you have any lab test that is abnormal or we need to change your treatment, we will call you to review the results.  Testing/Procedures: Your physician has requested that you have a lexiscan myoview. For further information please visit [email or web address]. Please follow instruction sheet, as given.   Follow-Up: At [facility], you and your health needs are our priority.  As part of our continuing mission to provide you with exceptional heart care, we have created designated Provider Care Teams.  These Care Teams include your primary Cardiologist (physician) and  Advanced Practice Providers (APPs -  Physician Assistants and Nurse Practitioners) who all work together to provide you with the care you need, when you need it.  Your next appointment:    As needed, unless stress test is positive   Thank you for choosing [facility] !        Signed, Erma Heritage, PA-C  01/28/2020 4:52 PM    [place] [address] [place], [place] [address] Phone: [phone] Fax: [phone]

## 2020-01-28 NOTE — Patient Instructions (Signed)
Medication Instructions:  Your physician recommends that you continue on your current medications as directed. Please refer to the Current Medication list given to you today.  *If you need a refill on your cardiac medications before your next appointment, please call your pharmacy*  Lab Work: NONE If you have labs (blood work) drawn today and your tests are completely normal, you will receive your results only by: Marland Kitchen MyChart Message (if you have MyChart) OR . A paper copy in the mail If you have any lab test that is abnormal or we need to change your treatment, we will call you to review the results.  Testing/Procedures: Your physician has requested that you have a lexiscan myoview. For further information please visit [email or web address]. Please follow instruction sheet, as given.    Follow-Up: At [facility], you and your health needs are our priority.  As part of our continuing mission to provide you with exceptional heart care, we have created designated Provider Care Teams.  These Care Teams include your primary Cardiologist (physician) and Advanced Practice Providers (APPs -  Physician Assistants and Nurse Practitioners) who all work together to provide you with the care you need, when you need it.  Your next appointment:    As needed, unless stress test is positive      Thank you for choosing [facility] !

## 2020-02-10 ENCOUNTER — Encounter (INDEPENDENT_AMBULATORY_CARE_PROVIDER_SITE_OTHER): Payer: Self-pay | Admitting: Nurse Practitioner

## 2020-02-10 ENCOUNTER — Other Ambulatory Visit: Payer: Self-pay

## 2020-02-10 ENCOUNTER — Ambulatory Visit (INDEPENDENT_AMBULATORY_CARE_PROVIDER_SITE_OTHER): Payer: 59 | Admitting: Nurse Practitioner

## 2020-02-10 ENCOUNTER — Ambulatory Visit (INDEPENDENT_AMBULATORY_CARE_PROVIDER_SITE_OTHER): Payer: BC Managed Care – PPO | Admitting: Internal Medicine

## 2020-02-10 VITALS — BP 140/99 | HR 83 | Temp 98.8°F | Ht 67.0 in | Wt 199.6 lb

## 2020-02-10 DIAGNOSIS — E559 Vitamin D deficiency, unspecified: Secondary | ICD-10-CM | POA: Diagnosis not present

## 2020-02-10 DIAGNOSIS — E291 Testicular hypofunction: Secondary | ICD-10-CM

## 2020-02-10 DIAGNOSIS — E782 Mixed hyperlipidemia: Secondary | ICD-10-CM | POA: Diagnosis not present

## 2020-02-10 NOTE — Assessment & Plan Note (Addendum)
We did discuss his options regarding his elevated cholesterol and the importance of controlling his LDL to hopefully prevent additional stroke or heart attack.  He would like to continue with the red yeast rice for now to see if this will help control his cholesterol.  I did inform him that his goal LDL is to be less than 70 and that currently his LDL is approximately twice that high.  We did discuss other statin options as well, but with shared decision making decided that he will continue with the red yeast rice and that he will return to the office approximately 2 weeks before his next office visit to have lipid panel collected.  He was told to fast for this blood draw.  Further recommendations will be made based upon those results.  He was also encouraged to follow-up with his cardiologist as scheduled and he tells me that he will.

## 2020-02-10 NOTE — Assessment & Plan Note (Signed)
He will continue on his testosterone as prescribed.  We will consider collecting serum levels at next office visit.

## 2020-02-10 NOTE — Progress Notes (Signed)
Subjective:  Patient ID: Jeffrey Allen, male    DOB: 1956-05-19  Age: 64 y.o. MRN: [id number]  CC:  Chief Complaint  Patient presents with  . Follow-up    Hyperlipidemia, vitamin D deficiency, hypogonadism      HPI  This patient arrives today for the above.  Hyperlipidemia: This patient has a history of hyperlipidemia and history of previous stroke.  Last lipid panel was collected in January of this year. Lipid Panel, see below.  He has been on atorvastatin in the past and did not tolerate this medication.  He does follow with cardiology and they did recommend that he at least be placed on a low-dose of Crestor.  He tells me currently he is taking some red yeast rice and has been taking this for a few weeks has tolerated it well so far.      Component                Value               Date/Time                 CHOL                     202 (H)             12/30/2019 0002           TRIG                     103                 12/30/2019 0002           HDL                      35 (L)              12/30/2019 0002           CHOLHDL                  5.8                 12/30/2019 0002           VLDL                     21                  12/30/2019 0002           LDLCALC                  146 (H)             12/30/2019 0002           LDLCALC                  138 (H)             11/07/2019 1057       Vitamin D deficiency: He continues on his vitamin D3 supplement.  He takes 10,000 IUs daily.  Last serum level was collected in November 2020 and it was 34.  It sounds like he was not taking this daily or regularly when his last blood work was collected.  Hypogonadism: He continues on testosterone injection for his hypogonadism.  He tells me that since he has started this medication he has  felt much better and has especially noted that his energy levels have improved.   Past Medical History:  Diagnosis Date  . GERD (gastroesophageal reflux disease)   . Gout   . Obesity (BMI  30.0-34.9)   . Peptic ulcer disease   . Prediabetes   . Stroke [facility])       Family History  Problem Relation Age of Onset  . Diabetes Father   . 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   Social History   Tobacco Use  . Smoking status: Former Smoker    Packs/day: 0.02    Years: 25.00    Pack years: 0.50    Types: Cigarettes    Quit date: 12/26/2006    Years since quitting: 13.1  . Smokeless tobacco: Never Used  Substance Use Topics  . Alcohol use: Yes    Alcohol/week: 14.0 standard drinks    Types: 14 Cans of beer per week     Current Meds  Medication Sig  . allopurinol (ZYLOPRIM) 300 MG tablet Take 0.5 tablets (150 mg total) by mouth daily.  . Ascorbic Acid (VITAMIN C PO) Take 2 tablets by mouth daily.  Marland Kitchen aspirin EC 81 MG EC tablet Take 1 tablet (81 mg total) by mouth daily.  . Cholecalciferol (VITAMIN D3) 125 MCG (5000 UT) TABS Take 2 tablets by mouth daily.  Marland Kitchen omeprazole (PRILOSEC) 10 MG capsule Take 10 mg by mouth daily.   . Red Yeast Rice Extract (RED YEAST RICE PO) Take by mouth.  . Testosterone Cypionate 200 MG/ML SOLN Inject 100 mg as directed once a week.    ROS:  Review of Systems  Constitutional: Negative for fever and malaise/fatigue.  Eyes: Negative for blurred vision.  Respiratory: Negative for cough, shortness of breath and wheezing.   Cardiovascular: Negative for chest pain.     Objective:   Today's Vitals: BP (!) 140/99 (BP Location: Left Arm, Patient Position: Sitting, Cuff Size: Normal)   Pulse 83   Temp 98.8 F (37.1 C) (Temporal)   Ht 5' 7"  (1.702 m)   Wt 199 lb 9.6 oz (90.5 kg)   SpO2 95%   BMI 31.26 kg/m  Vitals with BMI 02/10/2020 01/28/2020 01/06/2020  Height 5' 7"  5' 7"  (No Data)  Weight 199 lbs 10 oz 199 lbs (No Data)  BMI 93.81 01.75 -  Systolic 102 585 (No Data)  Diastolic 99 82 (No Data)  Pulse 83 73 -     Physical Exam Vitals  reviewed.  Constitutional:      Appearance: Normal appearance.  HENT:     Head: Normocephalic and atraumatic.  Cardiovascular:     Rate and Rhythm: Normal rate and regular rhythm.  Pulmonary:     Effort: Pulmonary effort is normal.     Breath sounds: Normal breath sounds.  Musculoskeletal:     Cervical back: Neck supple.  Skin:    General: Skin is warm and dry.  Neurological:     Mental Status: He is alert and oriented to person, place, and time.  Psychiatric:        Mood and Affect: Mood normal.        Behavior: Behavior normal.        Thought Content: Thought content normal.        Judgment: Judgment normal.          Assessment   1. Mixed hyperlipidemia  2. Testicular failure   3. Vitamin D deficiency       Tests ordered Orders Placed This Encounter  Procedures  . CMP with eGFR([facility])  . Lipid Panel  . Vitamin D, 25-hydroxy  . Testosterone, Free, Total, SHBG     Plan: Please see assessment and plan per problem list below.   No orders of the defined types were placed in this encounter.   Patient to follow-up in 3 months.  Ailene Ards, NP

## 2020-02-10 NOTE — Patient Instructions (Signed)
Thank you for choosing [facility] as your medical provider! If you have any questions or concerns regarding your health care, please do not hesitate to call our office.  Continue medications as prescribed.  Please remember to come to your blood draw appointment fasting.  Please follow-up as scheduled in 3 months. We look forward to seeing you again soon!   At [facility] we value your feedback. You may receive a survey about your visit today. Please share your experience as we strive to create trusting relationships with our patients to provide genuine, compassionate, quality care.  We appreciate your understanding and patience as we review any laboratory studies, imaging, and other diagnostic tests that are ordered as we care for you. We do our best to address any and all results in a timely manner. If you do not hear about test results within 1 week, please do not hesitate to contact us. If we referred you to a specialist during your visit or ordered imaging testing, contact the office if you have not been contacted to be scheduled within 1 weeks.  We also encourage the use of MyChart, which contains your medical information for your review as well. If you are not enrolled in this feature, an access code is on this after visit summary for your convenience. Thank you for allowing [place] to be involved in your care.

## 2020-02-10 NOTE — Assessment & Plan Note (Signed)
He is encouraged to continue taking his vitamin D3 supplement to take the recommended dose daily.  We will collect serum level when he comes back prior to his next office visit for blood work.

## 2020-02-17 ENCOUNTER — Other Ambulatory Visit (INDEPENDENT_AMBULATORY_CARE_PROVIDER_SITE_OTHER): Payer: 59

## 2020-02-24 ENCOUNTER — Other Ambulatory Visit: Payer: Self-pay

## 2020-02-24 ENCOUNTER — Other Ambulatory Visit (INDEPENDENT_AMBULATORY_CARE_PROVIDER_SITE_OTHER): Payer: 59

## 2020-02-25 ENCOUNTER — Telehealth (INDEPENDENT_AMBULATORY_CARE_PROVIDER_SITE_OTHER): Payer: Self-pay | Admitting: Nurse Practitioner

## 2020-02-25 ENCOUNTER — Encounter (INDEPENDENT_AMBULATORY_CARE_PROVIDER_SITE_OTHER): Payer: Self-pay | Admitting: Nurse Practitioner

## 2020-02-25 LAB — COMPLETE METABOLIC PANEL WITH GFR
AG Ratio: 1.5 (calc) (ref 1.0–2.5)
ALT: 11 U/L (ref 9–46)
AST: 15 U/L (ref 10–35)
Albumin: 4 g/dL (ref 3.6–5.1)
Alkaline phosphatase (APISO): 61 U/L (ref 35–144)
BUN: 18 mg/dL (ref 7–25)
CO2: 30 mmol/L (ref 20–32)
Calcium: 9.6 mg/dL (ref 8.6–10.3)
Chloride: 104 mmol/L (ref 98–110)
Creat: 1.04 mg/dL (ref 0.70–1.25)
GFR, Est African American: 89 mL/min/{1.73_m2} (ref >=60)
GFR, Est Non African American: 77 mL/min/{1.73_m2} (ref >=60)
Globulin: 2.7 g/dL (calc) (ref 1.9–3.7)
Glucose, Bld: 87 mg/dL (ref 65–99)
Potassium: 4.9 mmol/L (ref 3.5–5.3)
Sodium: 141 mmol/L (ref 135–146)
Total Bilirubin: 0.5 mg/dL (ref 0.2–1.2)
Total Protein: 6.7 g/dL (ref 6.1–8.1)

## 2020-02-25 LAB — LIPID PANEL
Cholesterol: 225 mg/dL — ABNORMAL HIGH (ref <200)
HDL: 40 mg/dL (ref >=40)
LDL Cholesterol (Calc): 160 mg/dL (calc) — ABNORMAL HIGH
Non-HDL Cholesterol (Calc): 185 mg/dL (calc) — ABNORMAL HIGH (ref <130)
Total CHOL/HDL Ratio: 5.6 (calc) — ABNORMAL HIGH (ref <5.0)
Triglycerides: 123 mg/dL (ref <150)

## 2020-02-25 LAB — VITAMIN D 25 HYDROXY (VIT D DEFICIENCY, FRACTURES): Vit D, 25-Hydroxy: 46 ng/mL (ref 30–100)

## 2020-02-25 NOTE — Telephone Encounter (Signed)
Please call this patient to get him scheduled for an appointment within the next 2 to 6 weeks.  For some reason his follow-up appointment is no longer on our schedule, I am not sure if he canceled or what happened, but he needs to be seen to discuss recent blood work results.  Thank you.

## 2020-02-27 ENCOUNTER — Other Ambulatory Visit: Payer: Self-pay

## 2020-02-27 ENCOUNTER — Ambulatory Visit (HOSPITAL_COMMUNITY)
Admission: RE | Admit: 2020-02-27 | Discharge: 2020-02-27 | Disposition: A | Discharge status: Home / Self Care | Payer: 59 | Source: Ambulatory Visit | Attending: Student | Admitting: Student

## 2020-02-27 ENCOUNTER — Encounter (HOSPITAL_COMMUNITY)
Admission: RE | Admit: 2020-02-27 | Discharge: 2020-02-27 | Disposition: A | Discharge status: Home / Self Care | Payer: 59 | Source: Ambulatory Visit | Attending: Student | Admitting: Student

## 2020-02-27 DIAGNOSIS — R0600 Dyspnea, unspecified: Secondary | ICD-10-CM | POA: Insufficient documentation

## 2020-02-27 DIAGNOSIS — R778 Other specified abnormalities of plasma proteins: Secondary | ICD-10-CM

## 2020-02-27 DIAGNOSIS — R0609 Other forms of dyspnea: Secondary | ICD-10-CM

## 2020-03-09 ENCOUNTER — Ambulatory Visit (INDEPENDENT_AMBULATORY_CARE_PROVIDER_SITE_OTHER): Payer: 59 | Admitting: Internal Medicine

## 2020-03-09 ENCOUNTER — Other Ambulatory Visit: Payer: Self-pay

## 2020-03-09 ENCOUNTER — Encounter (INDEPENDENT_AMBULATORY_CARE_PROVIDER_SITE_OTHER): Payer: Self-pay | Admitting: Internal Medicine

## 2020-03-09 VITALS — BP 120/70 | HR 77 | Temp 98.3°F | Resp 18 | Ht 67.0 in | Wt 201.4 lb

## 2020-03-09 DIAGNOSIS — I10 Essential (primary) hypertension: Secondary | ICD-10-CM

## 2020-03-09 DIAGNOSIS — E559 Vitamin D deficiency, unspecified: Secondary | ICD-10-CM | POA: Diagnosis not present

## 2020-03-09 DIAGNOSIS — E291 Testicular hypofunction: Secondary | ICD-10-CM

## 2020-03-09 DIAGNOSIS — E782 Mixed hyperlipidemia: Secondary | ICD-10-CM

## 2020-03-09 DIAGNOSIS — I639 Cerebral infarction, unspecified: Secondary | ICD-10-CM

## 2020-03-09 MED ORDER — TESTOSTERONE CYPIONATE 200 MG/ML IJ SOLN: 100.0000 mg | INTRAMUSCULAR | 1 refills | Status: DC

## 2020-03-09 NOTE — Progress Notes (Signed)
Metrics: Intervention Frequency ACO  Documented Smoking Status Yearly  Screened one or more times in 24 months  Cessation Counseling or  Active cessation medication Past 24 months  Past 24 months   Guideline developer: UpToDate (See UpToDate for funding source) Date Released: 2014       Wellness Office Visit  Subjective:  Patient ID: Jeffrey Allen, male    DOB: 16-Apr-1956  Age: 64 y.o. MRN: [id number]  CC: This man comes in for follow-up of obesity, cerebrovascular disease, hypogonadism, hyperlipidemia. HPI  He was seen by Judson Roch about a month ago and cholesterol numbers were elevated.  She had discussed with him about the possibility of medications for this.  He seems to be unclean to go on statin therapy at this time and is trying alternative methods.  Thankfully, he has not had any further stroke. He continues on testosterone therapy and he finds that this is beneficial. He also is taking vitamin D3 supplementation for vitamin D deficiency and his last lab work showed still suboptimal vitamin D levels even though he was taking 10,000 units a day. This patient had COVID-19 disease 2-1/2 months ago and he lost taste and also lost weight at that time. Past Medical History:  Diagnosis Date  . GERD (gastroesophageal reflux disease)   . Gout   . Obesity (BMI 30.0-34.9)   . Peptic ulcer disease   . Prediabetes   . Stroke [facility])       Family History  Problem Relation Age of Onset  . Diabetes Father   . 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   Social History   Tobacco Use  . Smoking status: Former Smoker    Packs/day: 0.02    Years: 25.00    Pack years: 0.50    Types: Cigarettes    Quit date: 12/26/2006    Years since quitting: 13.2  . Smokeless tobacco: Never Used  Substance Use Topics  . Alcohol use: Yes    Alcohol/week: 14.0 standard drinks    Types: 14 Cans of beer per week     Current Meds  Medication Sig  . allopurinol (ZYLOPRIM) 300 MG tablet Take 0.5 tablets (150 mg total) by mouth daily.  . Ascorbic Acid (VITAMIN C PO) Take 2 tablets by mouth daily.  Marland Kitchen aspirin EC 81 MG EC tablet Take 1 tablet (81 mg total) by mouth daily.  . Cholecalciferol (VITAMIN D3) 125 MCG (5000 UT) TABS Take 2 tablets by mouth daily.  Marland Kitchen omeprazole (PRILOSEC) 10 MG capsule Take 10 mg by mouth daily.   . Red Yeast Rice Extract (RED YEAST RICE PO) Take by mouth.  . Testosterone Cypionate 200 MG/ML SOLN Inject 100 mg as directed once a week.  . [DISCONTINUED] Testosterone Cypionate 200 MG/ML SOLN Inject 100 mg as directed once a week.     Bio Identical Hormones  Testosterone therapy is being used off label for symptoms of testosterone deficiency and benefits that it produces based on several studies.  These benefits include decreasing body fat, increasing in lean muscle mass and increasing in bone density.  There is improvement of memory, cognition.  There is improvement in exercise tolerance and endurance.  Testosterone therapy has also been shown to be protective against coronary artery disease, cerebrovascular disease, diabetes, hypertension and degenerative joint disease. I have discussed with the patient the FDA warnings regarding testosterone therapy, benefits and side  effects and modes of administration as well as monitoring blood levels and side effects  on a regular basis The patient is agreeable that testosterone therapy should be an integral part of his/her wellness,quality of life and prevention of chronic disease.  Objective:   Today's Vitals: BP 120/70 (BP Location: Left Arm, Patient Position: Sitting, Cuff Size: Normal)   Pulse 77   Temp 98.3 F (36.8 C) (Oral)   Resp 18   Ht 5\' 7"  (1.702 m)   Wt 201 lb 6.4 oz (91.4 kg)   SpO2 98%   BMI 31.54 kg/m  Vitals with BMI 03/09/2020 02/10/2020 01/28/2020  Height 5\' 7"  5\' 7"  5\' 7"   Weight 201 lbs 6 oz 199 lbs 10 oz 199 lbs   BMI 31.54 [id number]  Systolic 123456 [id number]  Diastolic 70 99 82  Pulse 77 83 73     Physical Exam   He looks systemically well.  Blood pressure is well controlled.  He remains obese.  No other new physical findings today.    Assessment   1. Testicular failure   2. Mixed hyperlipidemia   3. Essential hypertension   4. Vitamin D deficiency disease   5. Cerebrovascular accident (CVA), unspecified mechanism ([place])       Tests ordered No orders of the defined types were placed in this encounter.    Plan: 1. I have told him to increase vitamin D3 to 15,000 units daily to optimize. 2. We did discuss statin therapy and we will discuss again on the next visit.  3.  I have refilled his testosterone therapy today for his hypogonadism. 4. Follow-up in about 6 weeks when we will do all the blood work.   Meds ordered this encounter  Medications  . Testosterone Cypionate 200 MG/ML SOLN    Sig: Inject 100 mg as directed once a week.    Dispense:  10 mL    Refill:  1    Chasmine Lender Luther Parody, MD

## 2020-03-18 ENCOUNTER — Telehealth (INDEPENDENT_AMBULATORY_CARE_PROVIDER_SITE_OTHER): Payer: Self-pay

## 2020-03-19 NOTE — Telephone Encounter (Signed)
Unfortunately, the largest bottle of testosterone is 10 mL, this will not cover 100 days.  Please let them know.  He can have the option to pay out of his own pocket for the testosterone without using his insurance.  It is not very expensive.

## 2020-03-19 NOTE — Telephone Encounter (Signed)
Notified the via voicemail.

## 2020-03-30 ENCOUNTER — Other Ambulatory Visit (INDEPENDENT_AMBULATORY_CARE_PROVIDER_SITE_OTHER): Payer: Self-pay

## 2020-03-30 ENCOUNTER — Telehealth (INDEPENDENT_AMBULATORY_CARE_PROVIDER_SITE_OTHER): Payer: Self-pay

## 2020-03-30 ENCOUNTER — Other Ambulatory Visit (INDEPENDENT_AMBULATORY_CARE_PROVIDER_SITE_OTHER): Payer: Self-pay | Admitting: Internal Medicine

## 2020-03-30 DIAGNOSIS — M1000 Idiopathic gout, unspecified site: Secondary | ICD-10-CM

## 2020-03-30 MED ORDER — PREDNISONE 20 MG PO TABS: 40.0000 mg | ORAL_TABLET | Freq: Every day | ORAL | 1 refills | Status: DC

## 2020-03-30 MED ORDER — COLCHICINE-PROBENECID 0.5-500 MG PO TABS: 1.0000 | ORAL_TABLET | Freq: Two times a day (BID) | ORAL | 0 refills | Status: DC

## 2020-03-30 MED ORDER — ALLOPURINOL 300 MG PO TABS: 150.0000 mg | ORAL_TABLET | Freq: Every day | ORAL | 2 refills | Status: DC

## 2020-03-30 NOTE — Telephone Encounter (Signed)
LeighAnn Krystalynn Ridgeway, CMA  

## 2020-03-30 NOTE — Telephone Encounter (Signed)
Patient is aware 

## 2020-03-30 NOTE — Telephone Encounter (Signed)
Let the patient know that I have sent a prescription for prednisone to [facility] which he can start today or tomorrow.  If he does not improve after the course of 5 days, he will need to come into the office to be seen.

## 2020-03-30 NOTE — Telephone Encounter (Signed)
Coralyn Mark Is calling on behalf of Jeffrey Allen stating that he is having another episode of the gout and she is asking if he can get a refill on his medication or come in for a steroid injection for pain, please advise?

## 2020-03-30 NOTE — Telephone Encounter (Signed)
LeighAnn Esther Bradstreet, CMA  

## 2020-04-21 ENCOUNTER — Ambulatory Visit (INDEPENDENT_AMBULATORY_CARE_PROVIDER_SITE_OTHER): Payer: 59 | Admitting: Internal Medicine

## 2020-04-21 ENCOUNTER — Encounter (INDEPENDENT_AMBULATORY_CARE_PROVIDER_SITE_OTHER): Payer: Self-pay | Admitting: Internal Medicine

## 2020-04-21 ENCOUNTER — Other Ambulatory Visit: Payer: Self-pay

## 2020-04-21 VITALS — BP 140/85 | HR 63 | Temp 98.4°F | Ht 67.0 in | Wt 199.0 lb

## 2020-04-21 DIAGNOSIS — G5601 Carpal tunnel syndrome, right upper limb: Secondary | ICD-10-CM

## 2020-04-21 DIAGNOSIS — E291 Testicular hypofunction: Secondary | ICD-10-CM

## 2020-04-21 DIAGNOSIS — E559 Vitamin D deficiency, unspecified: Secondary | ICD-10-CM

## 2020-04-21 MED ORDER — GABAPENTIN 100 MG PO CAPS: 100.0000 mg | ORAL_CAPSULE | Freq: Three times a day (TID) | ORAL | 3 refills | Status: DC

## 2020-04-21 NOTE — Progress Notes (Signed)
Metrics: Intervention Frequency ACO  Documented Smoking Status Yearly  Screened one or more times in 24 months  Cessation Counseling or  Active cessation medication Past 24 months  Past 24 months   Guideline developer: UpToDate (See UpToDate for funding source) Date Released: 2014       Wellness Office Visit  Subjective:  Patient ID: Jeffrey Allen, male    DOB: Oct 19, 1956  Age: 64 y.o. MRN: [id number]  CC: This man comes in for follow-up of hypogonadism, vitamin D deficiency. HPI  He is also complaining of pain in the right hand relating to his carpal tunnel syndrome.  He has been recommended surgery but he wants to hold off on he wonders whether acupuncture  would be of benefit. From the last visit, he did increase the vitamin D3 to 15,000 units daily. He also continues on testosterone therapy once a week and this is beneficial for him. Past Medical History:  Diagnosis Date  . GERD (gastroesophageal reflux disease)   . Gout   . Obesity (BMI 30.0-34.9)   . Peptic ulcer disease   . Prediabetes   . Stroke [facility])       Family History  Problem Relation Age of Onset  . Diabetes Father   . 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   Social History   Tobacco Use  . Smoking status: Former Smoker    Packs/day: 0.02    Years: 25.00    Pack years: 0.50    Types: Cigarettes    Quit date: 12/26/2006    Years since quitting: 13.3  . Smokeless tobacco: Never Used  Substance Use Topics  . Alcohol use: Yes    Alcohol/week: 14.0 standard drinks    Types: 14 Cans of beer per week    Current Meds  Medication Sig  . allopurinol (ZYLOPRIM) 300 MG tablet Take 0.5 tablets (150 mg total) by mouth daily.  . Ascorbic Acid (VITAMIN C PO) Take 2 tablets by mouth daily.  Marland Kitchen aspirin EC 81 MG EC tablet Take 1 tablet (81 mg total) by mouth daily.  . Cholecalciferol (VITAMIN D3) 125 MCG (5000 UT) TABS  Take 3 tablets by mouth daily.   . Garlic 123XX123 MG CAPS Take 1,000 mg by mouth daily.  Marland Kitchen omeprazole (PRILOSEC) 10 MG capsule Take 10 mg by mouth daily.   . Red Yeast Rice Extract (RED YEAST RICE PO) Take by mouth.  . Testosterone Cypionate 200 MG/ML SOLN Inject 100 mg as directed once a week.       Objective:   Today's Vitals: BP 140/85 (BP Location: Left Arm, Patient Position: Sitting, Cuff Size: Normal)   Pulse 63   Temp 98.4 F (36.9 C) (Temporal)   Ht 5\' 7"  (1.702 m)   Wt 199 lb (90.3 kg)   SpO2 95%   BMI 31.17 kg/m  Vitals with BMI 04/21/2020 03/09/2020 02/10/2020  Height 5\' 7"  5\' 7"  5\' 7"   Weight 199 lbs 201 lbs 6 oz 199 lbs 10 oz  BMI 31.16 0000000 [id number]  Systolic [id number] 123456 [id number]  Diastolic 85 70 99  Pulse 63 77 83     Physical Exam  He looks systemically well.  His weight is fairly stable.  Blood pressure slightly elevated today.  He is alert and orientated without any focal neurologic signs.     Assessment   1. Vitamin D deficiency   2.  Testicular failure   3. Carpal tunnel syndrome of right wrist       Tests ordered Orders Placed This Encounter  Procedures  . Testosterone Total,Free,Bio, Males  . VITAMIN D 25 Hydroxy (Vit-D Deficiency, Fractures)  . COMPLETE METABOLIC PANEL WITH GFR     Plan: 1. Blood work is ordered above. 2. He will continue with testosterone therapy and we will see what levels show. 3. He will continue with vitamin D3 15,000 units and we will see if we need to further adjust the levels. 4. As far as carpal tunnel syndrome is concerned, I agreed with him regarding acupuncture but we also will try gabapentin 3 times a day to see if this will help him.  I warned him of possible side effects especially drowsiness. 5. Further recommendations will depend on blood results and I will see him in August for his annual physical exam.   Meds ordered this encounter  Medications  . gabapentin (NEURONTIN) 100 MG capsule    Sig: Take 1 capsule  (100 mg total) by mouth 3 (three) times daily.    Dispense:  90 capsule    Refill:  3    Duilio Heritage Luther Parody, MD

## 2020-04-22 ENCOUNTER — Encounter (INDEPENDENT_AMBULATORY_CARE_PROVIDER_SITE_OTHER): Payer: Self-pay | Admitting: Internal Medicine

## 2020-04-22 LAB — COMPLETE METABOLIC PANEL WITH GFR
AG Ratio: 1.8 (calc) (ref 1.0–2.5)
ALT: 20 U/L (ref 9–46)
AST: 20 U/L (ref 10–35)
Albumin: 4.1 g/dL (ref 3.6–5.1)
Alkaline phosphatase (APISO): 51 U/L (ref 35–144)
BUN: 19 mg/dL (ref 7–25)
CO2: 29 mmol/L (ref 20–32)
Calcium: 9.6 mg/dL (ref 8.6–10.3)
Chloride: 104 mmol/L (ref 98–110)
Creat: 1.11 mg/dL (ref 0.70–1.25)
GFR, Est African American: 82 mL/min/{1.73_m2} (ref >=60)
GFR, Est Non African American: 71 mL/min/{1.73_m2} (ref >=60)
Globulin: 2.3 g/dL (calc) (ref 1.9–3.7)
Glucose, Bld: 83 mg/dL (ref 65–99)
Potassium: 4.1 mmol/L (ref 3.5–5.3)
Sodium: 139 mmol/L (ref 135–146)
Total Bilirubin: 0.8 mg/dL (ref 0.2–1.2)
Total Protein: 6.4 g/dL (ref 6.1–8.1)

## 2020-04-22 LAB — TESTOSTERONE TOTAL,FREE,BIO, MALES
Albumin: 4.1 g/dL (ref 3.6–5.1)
Sex Hormone Binding: 24 nmol/L (ref 22–77)
Testosterone, Bioavailable: 449.8 ng/dL (ref >=110.0)
Testosterone, Free: 238.9 pg/mL — ABNORMAL HIGH (ref 46.0–224.0)
Testosterone: 1077 ng/dL — ABNORMAL HIGH (ref 250–827)

## 2020-04-22 LAB — VITAMIN D 25 HYDROXY (VIT D DEFICIENCY, FRACTURES): Vit D, 25-Hydroxy: 71 ng/mL (ref 30–100)

## 2020-06-13 ENCOUNTER — Other Ambulatory Visit: Payer: Self-pay

## 2020-06-13 ENCOUNTER — Ambulatory Visit
Admission: EM | Admit: 2020-06-13 | Discharge: 2020-06-13 | Disposition: A | Discharge status: Home / Self Care | Payer: 59 | Attending: Emergency Medicine | Admitting: Emergency Medicine

## 2020-06-13 ENCOUNTER — Encounter: Payer: Self-pay | Admitting: Emergency Medicine

## 2020-06-13 DIAGNOSIS — R1032 Left lower quadrant pain: Secondary | ICD-10-CM | POA: Insufficient documentation

## 2020-06-13 LAB — POCT URINALYSIS DIP (MANUAL ENTRY)
Bilirubin, UA: NEGATIVE
Glucose, UA: NEGATIVE mg/dL
Ketones, POC UA: NEGATIVE mg/dL
Leukocytes, UA: NEGATIVE
Nitrite, UA: NEGATIVE
Protein Ur, POC: NEGATIVE mg/dL
Spec Grav, UA: >=1.03 — AB (ref 1.010–1.025)
Urobilinogen, UA: 0.2 E.U./dL
pH, UA: 6 (ref 5.0–8.0)

## 2020-06-13 NOTE — Discharge Instructions (Addendum)
Go to ED for further evaluation to rule out other abdominal disease process

## 2020-06-13 NOTE — ED Provider Notes (Signed)
[place]   [id number] 06/13/20 Arrival Time: 1527  Chief Complaint  Patient presents with  . Abdominal Pain     SUBJECTIVE:  Jeffrey Allen is a 64 y.o. male who presents presented to the urgent care with a complaint of lower quadrant pain for the past 1 week.  He is also reporting feeling of swelling tongue.  Denies a precipitating event, trauma, close contacts with similar symptoms, recent travel or antibiotic use.  Localizes pain to left lower quadrant.  Describes as intermittent and achy in character.  Denies any aggravating or alleviating factors.  Has has not tried any OTC medications.  Denies similar symptoms in the past.  Last BM 06/13/2020 and patient report it was soft.  Denies story of constipation.  Denies fever, chills, appetite changes, weight changes, nausea, vomiting, chest pain, SOB, diarrhea, constipation, hematochezia, melena, dysuria, difficulty urinating, urgency, flank pain, loss of bowel or bladder function, dyspareunia, pelvic pain.     No LMP for male patient.  ROS: As per HPI.  All other pertinent ROS negative.     Past Medical History:  Diagnosis Date  . GERD (gastroesophageal reflux disease)   . Gout   . Obesity (BMI 30.0-34.9)   . Peptic ulcer disease   . Prediabetes   . Stroke [facility])    Past Surgical History:  Procedure Laterality Date  . COLONOSCOPY    . COLONOSCOPY N/A 12/07/2016   Procedure: COLONOSCOPY;  Surgeon: Daneil Dolin, MD;  Location[facility];  Service: Endoscopy;  Laterality: N/A;  9:15 AM   Allergies  Allergen Reactions  . Prednisone Other (See Comments)    Peptic ulcers     No current facility-administered medications on file prior to encounter.   Current Outpatient Medications on File Prior to Encounter  Medication Sig Dispense Refill  . allopurinol (ZYLOPRIM) 300 MG tablet Take 0.5 tablets (150 mg total) by mouth daily. 30 tablet 2  . Ascorbic Acid (VITAMIN C PO) Take 2 tablets by mouth daily.    Marland Kitchen  aspirin EC 81 MG EC tablet Take 1 tablet (81 mg total) by mouth daily.    . Cholecalciferol (VITAMIN D3) 125 MCG (5000 UT) TABS Take 3 tablets by mouth daily.     Marland Kitchen gabapentin (NEURONTIN) 100 MG capsule Take 1 capsule (100 mg total) by mouth 3 (three) times daily. 90 capsule 3  . Garlic 1950 MG CAPS Take 1,000 mg by mouth daily.    Marland Kitchen omeprazole (PRILOSEC) 10 MG capsule Take 10 mg by mouth daily.     . Red Yeast Rice Extract (RED YEAST RICE PO) Take by mouth.    . Testosterone Cypionate 200 MG/ML SOLN Inject 100 mg as directed once a week. 10 mL 1   Social History   Socioeconomic History  . Marital status: Married    Spouse name: Not on file  . Number of children: Not on file  . Years of education: Not on file  . Highest education level: Not on file  Occupational History  . Not on file  Tobacco Use  . Smoking status: Former Smoker    Packs/day: 0.02    Years: 25.00    Pack years: 0.50    Types: Cigarettes    Quit date: 12/26/2006    Years since quitting: 13.4  . Smokeless tobacco: Never Used  Vaping Use  . Vaping Use: Never used  Substance and Sexual Activity  . Alcohol use: Yes    Alcohol/week: 14.0 standard drinks  Types: 14 Cans of beer per week  . Drug use: No  . Sexual activity: Not on file  Other Topics Concern  . 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   Social Determinants of Health   Financial Resource Strain:   . Difficulty of Paying Living Expenses:   Food Insecurity:   . Worried About Charity fundraiser in the Last Year:   . Arboriculturist in the Last Year:   Transportation Needs:   . Film/video editor (Medical):   Marland Kitchen Lack of Transportation (Non-Medical):   Physical Activity:   . Days of Exercise per Week:   . Minutes of Exercise per Session:   Stress:   . Feeling of Stress :   Social Connections:   . Frequency of Communication with Friends and Family:   . Frequency of Social  Gatherings with Friends and Family:   . Attends Religious Services:   . Active Member of Clubs or Organizations:   . Attends Archivist Meetings:   Marland Kitchen Marital Status:   Intimate Partner Violence:   . Fear of Current or Ex-Partner:   . Emotionally Abused:   Marland Kitchen Physically Abused:   . Sexually Abused:    Family History  Problem Relation Age of Onset  . Diabetes Father   . Colon cancer Neg Hx      OBJECTIVE:  Vitals:   06/13/20 1533 06/13/20 1539  BP: (!) 151/90   Pulse: 62   Resp: 16   Temp: 98.3 F (36.8 C)   TempSrc: Oral   SpO2: 95%   Weight:  198 lb 6.6 oz (90 kg)  Height:  5\' 7"  (1.702 m)    Physical Exam Vitals and nursing note reviewed.  Constitutional:      General: He is not in acute distress.    Appearance: He is well-developed and normal weight. He is not ill-appearing, toxic-appearing or diaphoretic.  HENT:     Head: Normocephalic.     Right Ear: Tympanic membrane, ear canal and external ear normal. There is no impacted cerumen.     Left Ear: Tympanic membrane, ear canal and external ear normal. There is no impacted cerumen.     Mouth/Throat:     Lips: Pink.     Mouth: Mucous membranes are moist. No injury, oral lesions or angioedema.     Dentition: Normal dentition.     Tongue: No lesions.     Pharynx: No pharyngeal swelling or posterior oropharyngeal erythema.     Tonsils: No tonsillar exudate.  Cardiovascular:     Rate and Rhythm: Normal rate and regular rhythm.     Pulses: Normal pulses.     Heart sounds: Normal heart sounds. No murmur heard.  No friction rub. No gallop.   Pulmonary:     Effort: Pulmonary effort is normal. No respiratory distress.     Breath sounds: Normal breath sounds. No stridor. No wheezing, rhonchi or rales.  Chest:     Chest wall: No tenderness.  Abdominal:     General: Abdomen is flat. There is no distension.     Palpations: Abdomen is soft. There is no mass.     Tenderness: There is abdominal tenderness in the  left lower quadrant. There is no right CVA tenderness, left CVA tenderness, guarding or rebound.     Hernia: No hernia is present.  Neurological:     Mental Status: He is alert.  LABS: Results for orders placed or performed during the hospital encounter of 06/13/20 (from the past 24 hour(s))  POCT urinalysis dipstick     Status: Abnormal   Collection Time: 06/13/20  3:46 PM  Result Value Ref Range   Color, UA yellow yellow   Clarity, UA clear clear   Glucose, UA negative negative mg/dL   Bilirubin, UA negative negative   Ketones, POC UA negative negative mg/dL   Spec Grav, UA >=1.030 (A) 1.010 - 1.025   Blood, UA trace-intact (A) negative   pH, UA 6.0 5.0 - 8.0   Protein Ur, POC negative negative mg/dL   Urobilinogen, UA 0.2 0.2 or 1.0 E.U./dL   Nitrite, UA Negative Negative   Leukocytes, UA Negative Negative    DIAGNOSTIC STUDIES: No results found.   ASSESSMENT & PLAN:  1. Abdominal pain, left lower quadrant    Patient is stable at discharge.  POCT urine analysis show trace of blood which is inconclusive for UTI.  Culture was sent.  He was advised to go to ED for further evaluation to rule out other abdominal disease process. He declined it at this time but stated he  will go if symptom is worsening  No orders of the defined types were placed in this encounter.   Discharge Instructions  Patient was advised to go to ED for further evaluation   Reviewed expectations re: course of current medical issues. Questions answered. Outlined signs and symptoms indicating need for more acute intervention. Patient verbalized understanding. After Visit Summary given.    Note: This document was prepared using Dragon voice recognition software and may include unintentional dictation errors.    Emerson Monte, [place] 06/13/20 1611

## 2020-06-13 NOTE — ED Triage Notes (Addendum)
Pain to LLQ that started x 1week, radiates to the middle of his abdomen.  Hx of stomach ulcer. Also reports he had issue with his tongue today, he had a bad taste in his mouth and felt like his tongue was swelling.  Reports some urinary frequency and no problems with having bowel movements.

## 2020-06-15 LAB — URINE CULTURE: Culture: NO GROWTH

## 2020-08-24 ENCOUNTER — Encounter (INDEPENDENT_AMBULATORY_CARE_PROVIDER_SITE_OTHER): Payer: Self-pay

## 2020-08-24 ENCOUNTER — Other Ambulatory Visit: Payer: Self-pay

## 2020-08-24 ENCOUNTER — Ambulatory Visit (INDEPENDENT_AMBULATORY_CARE_PROVIDER_SITE_OTHER): Payer: 59 | Admitting: Internal Medicine

## 2020-08-24 ENCOUNTER — Encounter (INDEPENDENT_AMBULATORY_CARE_PROVIDER_SITE_OTHER): Payer: Self-pay | Admitting: Internal Medicine

## 2020-08-24 VITALS — BP 130/65 | HR 83 | Temp 97.8°F | Ht 67.0 in | Wt 200.6 lb

## 2020-08-24 DIAGNOSIS — I10 Essential (primary) hypertension: Secondary | ICD-10-CM

## 2020-08-24 DIAGNOSIS — E291 Testicular hypofunction: Secondary | ICD-10-CM | POA: Diagnosis not present

## 2020-08-24 DIAGNOSIS — E559 Vitamin D deficiency, unspecified: Secondary | ICD-10-CM

## 2020-08-24 NOTE — Progress Notes (Signed)
Metrics: Intervention Frequency ACO  Documented Smoking Status Yearly  Screened one or more times in 24 months  Cessation Counseling or  Active cessation medication Past 24 months  Past 24 months   Guideline developer: UpToDate (See UpToDate for funding source) Date Released: 2014       Wellness Office Visit  Subjective:  Patient ID: Jeffrey Allen, male    DOB: 12/01/1956  Age: 64 y.o. MRN: [id number]  CC: Follow up of testosterone therapy ,vitamin D deficiency. HPI Doing well,continues on same treatment.  Past Medical History:  Diagnosis Date  . GERD (gastroesophageal reflux disease)   . Gout   . Obesity (BMI 30.0-34.9)   . Peptic ulcer disease   . Prediabetes   . Stroke [facility])    Past Surgical History:  Procedure Laterality Date  . COLONOSCOPY    . COLONOSCOPY N/A 12/07/2016   Procedure: COLONOSCOPY;  Surgeon: Daneil Dolin, MD;  Location[facility];  Service: Endoscopy;  Laterality: N/A;  9:15 AM     Family History  Problem Relation Age of Onset  . Diabetes Father   . 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   Social History   Tobacco Use  . Smoking status: Former Smoker    Packs/day: 0.02    Years: 25.00    Pack years: 0.50    Types: Cigarettes    Quit date: 12/26/2006    Years since quitting: 13.6  . Smokeless tobacco: Never Used  Substance Use Topics  . Alcohol use: Yes    Alcohol/week: 14.0 standard drinks    Types: 14 Cans of beer per week    Current Meds  Medication Sig  . allopurinol (ZYLOPRIM) 300 MG tablet Take 0.5 tablets (150 mg total) by mouth daily.  . Ascorbic Acid (VITAMIN C PO) Take 2 tablets by mouth daily.  Marland Kitchen aspirin EC 81 MG EC tablet Take 1 tablet (81 mg total) by mouth daily.  . Cholecalciferol (VITAMIN D3) 125 MCG (5000 UT) TABS Take 3 tablets by mouth daily.   Marland Kitchen gabapentin (NEURONTIN) 100 MG capsule Take 1 capsule (100 mg total) by  mouth 3 (three) times daily.  . Garlic 8413 MG CAPS Take 1,000 mg by mouth daily.  Marland Kitchen omeprazole (PRILOSEC) 10 MG capsule Take 10 mg by mouth daily.   . Red Yeast Rice Extract (RED YEAST RICE PO) Take by mouth.  . Testosterone Cypionate 200 MG/ML SOLN Inject 100 mg as directed once a week.      Depression screen [facility] 2/9 08/24/2020 02/10/2020  Decreased Interest 0 0  Down, Depressed, Hopeless 0 0  PHQ - 2 Score 0 0     Objective:   Today's Vitals: BP 130/65 (BP Location: Left Arm, Patient Position: Sitting, Cuff Size: Normal)   Pulse 83   Temp 97.8 F (36.6 C) (Temporal)   Ht 5\' 7"  (1.702 m)   Wt 200 lb 9.6 oz (91 kg)   SpO2 96%   BMI 31.42 kg/m  Vitals with BMI 08/24/2020 06/13/2020 04/21/2020  Height 5\' 7"  5\' 7"  5\' 7"   Weight 200 lbs 10 oz 198 lbs 7 oz 199 lbs  BMI 31.41 24.40 10.27  Systolic 253 664 403  Diastolic 65 90 85  Pulse 83 62 63     Physical Exam  He looks well.  No new physical findings.     Assessment   1.  Essential hypertension   2. Testicular failure   3. Vitamin D deficiency       Tests ordered No orders of the defined types were placed in this encounter.    Plan: 1. He will continue with testosterone therapy for his hypogonadism.  His levels were in a good range last time. 2. He will continue with vitamin D3 supplementation for vitamin D deficiency and his levels were in a good range last time. 3. Today I spoke to him about COVID-19 vaccination and I strongly recommend that they get the vaccination now. 4. I will see him in October for an annual physical exam, something we could not do today because of practice staff shortages.   No orders of the defined types were placed in this encounter.   Doree Albee, MD

## 2020-08-25 ENCOUNTER — Ambulatory Visit
Admission: EM | Admit: 2020-08-25 | Discharge: 2020-08-25 | Disposition: A | Discharge status: Home / Self Care | Payer: 59 | Attending: Emergency Medicine | Admitting: Emergency Medicine

## 2020-08-25 ENCOUNTER — Other Ambulatory Visit: Payer: Self-pay

## 2020-08-25 DIAGNOSIS — R1032 Left lower quadrant pain: Secondary | ICD-10-CM | POA: Diagnosis not present

## 2020-08-25 LAB — POCT URINALYSIS DIP (MANUAL ENTRY)
Bilirubin, UA: NEGATIVE
Glucose, UA: NEGATIVE mg/dL
Ketones, POC UA: NEGATIVE mg/dL
Leukocytes, UA: NEGATIVE
Nitrite, UA: NEGATIVE
Protein Ur, POC: NEGATIVE mg/dL
Spec Grav, UA: 1.025 (ref 1.010–1.025)
Urobilinogen, UA: 1 E.U./dL
pH, UA: 6 (ref 5.0–8.0)

## 2020-08-25 MED ORDER — AMOXICILLIN-POT CLAVULANATE 875-125 MG PO TABS: 1.0000 | ORAL_TABLET | Freq: Two times a day (BID) | ORAL | 0 refills | Status: AC

## 2020-08-25 NOTE — ED Triage Notes (Signed)
Pt presents with left lower quadrant pain that suddenly began yesterday and experienced vomiting once, abdomen is tender to palpation

## 2020-08-25 NOTE — Discharge Instructions (Signed)
Unable to rule out diverticulitis, kidney stone, hernia, appendicitis, etc... in urgent care setting.  Offered patient further evaluation and management in the ED.  Patient declines at this time and would like to try outpatient therapy first.  Aware of the risk associated with this decision including missed diagnosis, organ damage, organ failure, and/or death.  Patient aware and in agreement.     Urine without signs of infection.  Trace lysed blood.  This may be a sign of kidney stone Based on symptoms will cover for possible diverticulitis based on symptoms Augmentin prescribed.   Follow up with PCP Call 911 or go to the ED if symptoms worsen or you develop new symptoms such as fever, vomiting, diarrhea, blood in stool, testicular pain/ swelling, etc..Marland Kitchen

## 2020-08-25 NOTE — ED Provider Notes (Signed)
[place]   [id number] 08/25/20 Arrival Time: 0921  CC: ABDOMINAL DISCOMFORT  SUBJECTIVE:  Jeffrey Allen is a 64 y.o. male who presents with complaint of LLQ pain x 1 day.  Denies a precipitating event, trauma, close contacts with similar symptoms, recent travel or antibiotic use.  Localizes pain to LLQ.  Describes as constant, waxes and wanes, and sharp in character.  Has not tried OTC medications.  Worse with riding in tractor.  Denies similar symptoms in the past.  Last BM yesterday and normal for patient.  Complains of associated vomiting, increased urination frequency.    Denies fever, chills, chest pain, SOB, diarrhea, constipation, hematochezia, melena, dysuria, difficulty urinating, flank pain, loss of bowel or bladder function.  No LMP for male patient.  ROS: As per HPI.  All other pertinent ROS negative.     Past Medical History:  Diagnosis Date  . GERD (gastroesophageal reflux disease)   . Gout   . Obesity (BMI 30.0-34.9)   . Peptic ulcer disease   . Prediabetes   . Stroke [facility])    Past Surgical History:  Procedure Laterality Date  . COLONOSCOPY    . COLONOSCOPY N/A 12/07/2016   Procedure: COLONOSCOPY;  Surgeon: Daneil Dolin, MD;  Location: [facility];  Service: Endoscopy;  Laterality: N/A;  9:15 AM   Allergies  Allergen Reactions  . Prednisone Other (See Comments)    Peptic ulcers     No current facility-administered medications on file prior to encounter.   Current Outpatient Medications on File Prior to Encounter  Medication Sig Dispense Refill  . allopurinol (ZYLOPRIM) 300 MG tablet Take 0.5 tablets (150 mg total) by mouth daily. 30 tablet 2  . Ascorbic Acid (VITAMIN C PO) Take 2 tablets by mouth daily.    Marland Kitchen aspirin EC 81 MG EC tablet Take 1 tablet (81 mg total) by mouth daily.    . Cholecalciferol (VITAMIN D3) 125 MCG (5000 UT) TABS Take 3 tablets by mouth daily.     Marland Kitchen gabapentin (NEURONTIN) 100 MG capsule Take 1 capsule (100 mg  total) by mouth 3 (three) times daily. 90 capsule 3  . Garlic 6063 MG CAPS Take 1,000 mg by mouth daily.    Marland Kitchen omeprazole (PRILOSEC) 10 MG capsule Take 10 mg by mouth daily.     . Red Yeast Rice Extract (RED YEAST RICE PO) Take by mouth.    . Testosterone Cypionate 200 MG/ML SOLN Inject 100 mg as directed once a week. 10 mL 1   Social History   Socioeconomic History  . Marital status: Married    Spouse name: Not on file  . Number of children: Not on file  . Years of education: Not on file  . Highest education level: Not on file  Occupational History  . Not on file  Tobacco Use  . Smoking status: Former Smoker    Packs/day: 0.02    Years: 25.00    Pack years: 0.50    Types: Cigarettes    Quit date: 12/26/2006    Years since quitting: 13.6  . Smokeless tobacco: Never Used  Vaping Use  . Vaping Use: Never used  Substance and Sexual Activity  . Alcohol use: Yes    Alcohol/week: 14.0 standard drinks    Types: 14 Cans of beer per week  . Drug use: No  . Sexual activity: Not on file  Other Topics Concern  . 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   Social Determinants of Health   Financial Resource Strain:   . Difficulty of Paying Living Expenses: Not on file  Food Insecurity:   . Worried About Charity fundraiser in the Last Year: Not on file  . Ran Out of Food in the Last Year: Not on file  Transportation Needs:   . Lack of Transportation (Medical): Not on file  . Lack of Transportation (Non-Medical): Not on file  Physical Activity:   . Days of Exercise per Week: Not on file  . Minutes of Exercise per Session: Not on file  Stress:   . Feeling of Stress : Not on file  Social Connections:   . Frequency of Communication with Friends and Family: Not on file  . Frequency of Social Gatherings with Friends and Family: Not on file  . Attends Religious Services: Not on file  . Active Member of Clubs or Organizations:  Not on file  . Attends Archivist Meetings: Not on file  . Marital Status: Not on file  Intimate Partner Violence:   . Fear of Current or Ex-Partner: Not on file  . Emotionally Abused: Not on file  . Physically Abused: Not on file  . Sexually Abused: Not on file   Family History  Problem Relation Age of Onset  . Diabetes Father   . Colon cancer Neg Hx      OBJECTIVE:  Vitals:   08/25/20 0940  BP: 116/70  Pulse: 61  Resp: 20  Temp: 98.3 F (36.8 C)  SpO2: 94%    General appearance: Alert; NAD HEENT: NCAT.  Oropharynx clear.  Lungs: clear to auscultation bilaterally without adventitious breath sounds Heart: regular rate and rhythm.  Abdomen: soft, non-distended; normal active bowel sounds; TTP over lower abdomen; tender at McBurney's point; negative Murphy's sign; + guarding Back: no CVA tenderness Extremities: no edema; symmetrical with no gross deformities Skin: warm and dry Neurologic: normal gait Psychological: alert and cooperative; normal mood and affect  LABS: Results for orders placed or performed during the hospital encounter of 08/25/20 (from the past 24 hour(s))  POCT urinalysis dipstick     Status: Abnormal   Collection Time: 08/25/20 10:41 AM  Result Value Ref Range   Color, UA yellow yellow   Clarity, UA clear clear   Glucose, UA negative negative mg/dL   Bilirubin, UA negative negative   Ketones, POC UA negative negative mg/dL   Spec Grav, UA 1.025 1.010 - 1.025   Blood, UA trace-lysed (A) negative   pH, UA 6.0 5.0 - 8.0   Protein Ur, POC negative negative mg/dL   Urobilinogen, UA 1.0 0.2 or 1.0 E.U./dL   Nitrite, UA Negative Negative   Leukocytes, UA Negative Negative    ASSESSMENT & PLAN:  1. LLQ pain     Meds ordered this encounter  Medications  . amoxicillin-clavulanate (AUGMENTIN) 875-125 MG tablet    Sig: Take 1 tablet by mouth every 12 (twelve) hours for 10 days.    Dispense:  20 tablet    Refill:  0    Order Specific  Question:   Supervising Provider    Answer:   Raylene Everts [[id number]]    Unable to rule out diverticulitis, kidney stone, hernia, appendicitis, etc... in urgent care setting.  Offered patient further evaluation and management in the ED.  Patient declines at this time and would like to try outpatient therapy first.  Aware of the risk associated with this decision including  missed diagnosis, organ damage, organ failure, and/or death.  Patient aware and in agreement.     Urine without signs of infection.  Trace lysed blood.  This may be a sign of kidney stone Based on symptoms will cover for possible diverticulitis based on symptoms Augmentin prescribed.   Follow up with PCP Call 911 or go to the ED if symptoms worsen or you develop new symptoms such as fever, vomiting, diarrhea, blood in stool, testicular pain/ swelling, etc...  Reviewed expectations re: course of current medical issues. Questions answered. Outlined signs and symptoms indicating need for more acute intervention. Patient verbalized understanding. After Visit Summary given.   Lestine Box, PA-C 08/25/20 1046

## 2020-08-26 LAB — URINE CULTURE: Culture: NO GROWTH

## 2020-08-27 ENCOUNTER — Ambulatory Visit (INDEPENDENT_AMBULATORY_CARE_PROVIDER_SITE_OTHER): Payer: 59 | Admitting: Nurse Practitioner

## 2020-08-27 ENCOUNTER — Encounter (INDEPENDENT_AMBULATORY_CARE_PROVIDER_SITE_OTHER): Payer: Self-pay | Admitting: Nurse Practitioner

## 2020-08-27 ENCOUNTER — Other Ambulatory Visit: Payer: Self-pay

## 2020-08-27 ENCOUNTER — Telehealth (INDEPENDENT_AMBULATORY_CARE_PROVIDER_SITE_OTHER): Payer: Self-pay | Admitting: Nurse Practitioner

## 2020-08-27 VITALS — BP 160/90 | Temp 98.1°F | Ht 67.0 in | Wt 196.4 lb

## 2020-08-27 DIAGNOSIS — R1032 Left lower quadrant pain: Secondary | ICD-10-CM | POA: Diagnosis not present

## 2020-08-27 NOTE — Telephone Encounter (Signed)
Patient requesting refill on testosterone today.

## 2020-08-27 NOTE — Progress Notes (Signed)
Subjective:  Patient ID: Jeffrey Allen, male    DOB: [birth year]  Age: 64 y.o. MRN: [id number]  CC:  Chief Complaint  Patient presents with  . Abdominal Pain    LLQ  . Hematuria    urine checked at Urgent Care      HPI  This patient arrives today for acute visit for the above.  He tells me that he has been having abdominal pain for approximately 4 days.  He tells me he initially felt the pain 4 days ago right after dinner, and the pain has waxed and waned since then.  He tells me certain movements especially riding the tractor seems to elicit the pain, and resting seems to make the pain better.  He tells me the pain is located to his left lower quadrant.  He has noticed a weakened urine stream.  He tells me the pain is a 3/10 in intensity and will last for 3 to 4 seconds at a time and will go away automatically.  He has noticed that his urine appears to be dark.  He did go to urgent care about 3 days ago at which point he was placed on Augmentin for empiric treatment of possible diverticulitis.  I do see that urinalysis was collected at urgent care but was found to be negative for signs of UTI, lysed red blood cells were noted however.  He was told to follow-up with his primary care provider for further evaluation management.  He denies any nausea, vomiting, blood in his stool, diarrhea.  He does report that he has had some mild constipation, but did have a bowel movement today and tells me the movement was well formed.  He did run a fever the evening that his pain started, but this ended up resolving on its own overnight, he denies any further fevers since then.  Past Medical History:  Diagnosis Date  . GERD (gastroesophageal reflux disease)   . Gout   . Obesity (BMI 30.0-34.9)   . Peptic ulcer disease   . Prediabetes   . Stroke [facility])       Family History  Problem Relation Age of Onset  . Diabetes Father   . 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   Social History   Tobacco Use  . Smoking status: Former Smoker    Packs/day: 0.02    Years: 25.00    Pack years: 0.50    Types: Cigarettes    Quit date: 12/26/2006    Years since quitting: 13.6  . Smokeless tobacco: Never Used  Substance Use Topics  . Alcohol use: Yes    Alcohol/week: 14.0 standard drinks    Types: 14 Cans of beer per week     Current Meds  Medication Sig  . allopurinol (ZYLOPRIM) 300 MG tablet Take 0.5 tablets (150 mg total) by mouth daily.  Marland Kitchen amoxicillin-clavulanate (AUGMENTIN) 875-125 MG tablet Take 1 tablet by mouth every 12 (twelve) hours for 10 days.  . Ascorbic Acid (VITAMIN C PO) Take 2 tablets by mouth daily.  Marland Kitchen aspirin EC 81 MG EC tablet Take 1 tablet (81 mg total) by mouth daily.  . Cholecalciferol (VITAMIN D3) 125 MCG (5000 UT) TABS Take 3 tablets by mouth daily.   Marland Kitchen gabapentin (NEURONTIN) 100 MG capsule Take 1 capsule (100 mg total) by mouth 3 (three) times daily.  . Garlic  1000 MG CAPS Take 1,000 mg by mouth daily.  Marland Kitchen omeprazole (PRILOSEC) 10 MG capsule Take 10 mg by mouth daily.   . Testosterone Cypionate 200 MG/ML SOLN Inject 100 mg as directed once a week.    ROS:  Review of Systems  Constitutional: Fever: evening of 8/30.  Gastrointestinal: Positive for abdominal pain (LLQ) and constipation. Negative for blood in stool, diarrhea, nausea and vomiting.  Genitourinary: Negative for dysuria and hematuria.       Weakened stream     Objective:   Today's Vitals: BP (!) 160/90 (BP Location: Left Arm, Patient Position: Sitting, Cuff Size: Normal)   Temp 98.1 F (36.7 C) (Temporal)   Ht 5\' 7"  (1.702 m)   Wt 196 lb 6.4 oz (89.1 kg)   BMI 30.76 kg/m  Vitals with BMI 08/27/2020 08/25/2020 08/24/2020  Height 5\' 7"  - 5\' 7"   Weight 196 lbs 6 oz - 200 lbs 10 oz  BMI 10.25 - 85.27  Systolic 782 423 536  Diastolic 90 70 65  Pulse - 61 83     Physical Exam Vitals reviewed.    Constitutional:      General: He is not in acute distress.    Appearance: He is well-developed. He is not ill-appearing or diaphoretic.  Abdominal:     General: Bowel sounds are normal. There is no distension or abdominal bruit.     Palpations: Abdomen is soft. There is no hepatomegaly, splenomegaly or mass.     Tenderness: There is abdominal tenderness in the suprapubic area and left lower quadrant. There is no right CVA tenderness, left CVA tenderness or guarding.     Hernia: No hernia is present.  Genitourinary:    Prostate: Enlarged.     Rectum: Normal. No mass or tenderness.  Skin:    General: Skin is warm and dry.  Neurological:     General: No focal deficit present.     Mental Status: He is alert.          Assessment and Plan   1. Left lower quadrant abdominal pain      Plan: 1.  Etiology is currently uncertain.  It did feel that his bladder may be extended slightly on exam.  However the majority of his pain is located to the left lower quadrant and with his abdominal distention, discomfort, and constipation I am going to start by getting CT scan of the abdomen for further evaluation.  I will also check CBC with differential and will recheck urine to see if blood is still present.  High my differentials include kidney stone, enlarged prostate leading to bladder obstruction, diverticulitis.  Will await CT scan results before making further recommendations, however he was told that if his symptoms progress and worsen in any way especially if he experiences inability to urinate, severe abdominal pain, vomiting, and diarrhea he should go to the emergency department.  He tells me he understands.   Tests ordered Orders Placed This Encounter  Procedures  . CT Abdomen Pelvis W Contrast  . Urinalysis with Culture Reflex  . CBC with Differential/Platelets      No orders of the defined types were placed in this encounter.   Patient to follow-up as scheduled next month or  sooner based on imaging results.  Ailene Ards, NP

## 2020-08-27 NOTE — Telephone Encounter (Signed)
I have ordered CT scan of the abdomen please make sure this is sent through to insurance and scheduled.

## 2020-08-28 LAB — CBC WITH DIFFERENTIAL/PLATELET
Absolute Monocytes: 972 cells/uL — ABNORMAL HIGH (ref 200–950)
Basophils Absolute: 49 cells/uL (ref 0–200)
Basophils Relative: 0.4 %
Eosinophils Absolute: 234 cells/uL (ref 15–500)
Eosinophils Relative: 1.9 %
HCT: 55.8 % — ABNORMAL HIGH (ref 38.5–50.0)
Hemoglobin: 19.1 g/dL — ABNORMAL HIGH (ref 13.2–17.1)
Lymphs Abs: 1734 cells/uL (ref 850–3900)
MCH: 31.4 pg (ref 27.0–33.0)
MCHC: 34.2 g/dL (ref 32.0–36.0)
MCV: 91.8 fL (ref 80.0–100.0)
MPV: 11.6 fL (ref 7.5–12.5)
Monocytes Relative: 7.9 %
Neutro Abs: 9311 cells/uL — ABNORMAL HIGH (ref 1500–7800)
Neutrophils Relative %: 75.7 %
Platelets: 201 10*3/uL (ref 140–400)
RBC: 6.08 10*6/uL — ABNORMAL HIGH (ref 4.20–5.80)
RDW: 12.9 % (ref 11.0–15.0)
Total Lymphocyte: 14.1 %
WBC: 12.3 10*3/uL — ABNORMAL HIGH (ref 3.8–10.8)

## 2020-08-28 LAB — URINALYSIS W MICROSCOPIC + REFLEX CULTURE
Bacteria, UA: NONE SEEN /HPF
Bilirubin Urine: NEGATIVE
Glucose, UA: NEGATIVE
Hyaline Cast: NONE SEEN /LPF
Ketones, ur: NEGATIVE
Leukocyte Esterase: NEGATIVE
Nitrites, Initial: NEGATIVE
Protein, ur: NEGATIVE
Specific Gravity, Urine: 1.021 (ref 1.001–1.03)
Squamous Epithelial / HPF: NONE SEEN /HPF (ref <=5)
WBC, UA: NONE SEEN /HPF (ref 0–5)
pH: 5.5 (ref 5.0–8.0)

## 2020-08-28 LAB — NO CULTURE INDICATED

## 2020-09-01 ENCOUNTER — Other Ambulatory Visit (INDEPENDENT_AMBULATORY_CARE_PROVIDER_SITE_OTHER): Payer: Self-pay | Admitting: Internal Medicine

## 2020-09-01 MED ORDER — TESTOSTERONE CYPIONATE 200 MG/ML IJ SOLN: 100.0000 mg | INTRAMUSCULAR | 1 refills | Status: DC

## 2020-09-01 NOTE — Telephone Encounter (Signed)
noted 

## 2020-09-03 ENCOUNTER — Ambulatory Visit (HOSPITAL_COMMUNITY)
Admission: RE | Admit: 2020-09-03 | Discharge: 2020-09-03 | Disposition: A | Discharge status: Home / Self Care | Payer: 59 | Source: Ambulatory Visit | Attending: Nurse Practitioner | Admitting: Nurse Practitioner

## 2020-09-03 ENCOUNTER — Other Ambulatory Visit: Payer: Self-pay

## 2020-09-03 DIAGNOSIS — R1032 Left lower quadrant pain: Secondary | ICD-10-CM | POA: Insufficient documentation

## 2020-09-03 LAB — POCT I-STAT CREATININE: Creatinine, Ser: 1.3 mg/dL — ABNORMAL HIGH (ref 0.61–1.24)

## 2020-09-03 MED ORDER — IOHEXOL 350 MG/ML SOLN
100.0000 mL | Freq: Once | INTRAVENOUS | Status: AC | PRN
  Administered 2020-09-03: 100 mL via INTRAVENOUS

## 2020-09-04 ENCOUNTER — Other Ambulatory Visit (INDEPENDENT_AMBULATORY_CARE_PROVIDER_SITE_OTHER): Payer: Self-pay | Admitting: Internal Medicine

## 2020-09-04 MED ORDER — CIPROFLOXACIN HCL 500 MG PO TABS: 500.0000 mg | ORAL_TABLET | Freq: Two times a day (BID) | ORAL | 0 refills | Status: DC

## 2020-09-04 MED ORDER — METRONIDAZOLE 500 MG PO TABS: 500.0000 mg | ORAL_TABLET | Freq: Three times a day (TID) | ORAL | 0 refills | Status: DC

## 2020-09-04 NOTE — Progress Notes (Signed)
I have spoken to the patient on the phone regarding the results of the CT scan today.  He has diverticulitis with a small abscess.  The patient has been on Augmentin from the urgent care.  He says he does feel better. I told him I will switch the antibiotics to ciprofloxacin and metronidazole. I have told him to come back for a follow-up appointment next week on 09/09/2020. If he gets worse instead of getting better, he should go straight to the emergency room.  He is aware of this.

## 2020-09-09 ENCOUNTER — Ambulatory Visit (INDEPENDENT_AMBULATORY_CARE_PROVIDER_SITE_OTHER): Payer: 59 | Admitting: Nurse Practitioner

## 2020-09-09 ENCOUNTER — Encounter (INDEPENDENT_AMBULATORY_CARE_PROVIDER_SITE_OTHER): Payer: Self-pay | Admitting: Nurse Practitioner

## 2020-09-09 ENCOUNTER — Other Ambulatory Visit: Payer: Self-pay

## 2020-09-09 VITALS — BP 132/77 | HR 71 | Temp 97.3°F | Resp 18 | Ht 68.0 in | Wt 195.0 lb

## 2020-09-09 DIAGNOSIS — K5792 Diverticulitis of intestine, part unspecified, without perforation or abscess without bleeding: Secondary | ICD-10-CM

## 2020-09-09 NOTE — Progress Notes (Signed)
Subjective:  Patient ID: Jeffrey Allen, male    DOB: November 10, 1956  Age: 64 y.o. MRN: [id number]  CC:  Chief Complaint  Patient presents with  . Diverticulitis      HPI  This patient arrives today for the above.  He was last seen in this office approximately 2 weeks ago.  At that time he had been experiencing abdominal pain for about 4 days.  He was evaluated in urgent care and they empirically treated him for diverticulitis with Augmentin.  I did send him for CT scan of the abdomen because his pain had been persisting and was not improving much.  CT of the abdomen did show diverticulitis with abscess formation.  Dr. Anastasio Champion received these results via telephone from reading radiologist.  He then changed the patient from Augmentin to treatment via metronidazole and ciprofloxacin.  The patient arrives today for close follow-up.  He tells me that his abdominal pain is much improved.  He feels a bit tired, but otherwise is feeling well and reports regular bowel movements.   Past Medical History:  Diagnosis Date  . GERD (gastroesophageal reflux disease)   . Gout   . Obesity (BMI 30.0-34.9)   . Peptic ulcer disease   . Prediabetes   . Stroke [facility])       Family History  Problem Relation Age of Onset  . Diabetes Father   . 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   Social History   Tobacco Use  . Smoking status: Former Smoker    Packs/day: 0.02    Years: 25.00    Pack years: 0.50    Types: Cigarettes    Quit date: 12/26/2006    Years since quitting: 13.7  . Smokeless tobacco: Never Used  Substance Use Topics  . Alcohol use: Yes    Alcohol/week: 14.0 standard drinks    Types: 14 Cans of beer per week     Current Meds  Medication Sig  . allopurinol (ZYLOPRIM) 300 MG tablet Take 0.5 tablets (150 mg total) by mouth daily.  . Ascorbic Acid (VITAMIN C PO) Take 2 tablets by mouth  daily.  Marland Kitchen aspirin EC 81 MG EC tablet Take 1 tablet (81 mg total) by mouth daily.  . Cholecalciferol (VITAMIN D3) 125 MCG (5000 UT) TABS Take 3 tablets by mouth daily.   . ciprofloxacin (CIPRO) 500 MG tablet Take 1 tablet (500 mg total) by mouth 2 (two) times daily.  Marland Kitchen gabapentin (NEURONTIN) 100 MG capsule Take 1 capsule (100 mg total) by mouth 3 (three) times daily.  . Garlic 7867 MG CAPS Take 1,000 mg by mouth daily.  . metroNIDAZOLE (FLAGYL) 500 MG tablet Take 1 tablet (500 mg total) by mouth 3 (three) times daily.  Marland Kitchen omeprazole (PRILOSEC) 10 MG capsule Take 10 mg by mouth daily.   . Red Yeast Rice Extract (RED YEAST RICE PO) Take by mouth.  . Testosterone Cypionate 200 MG/ML SOLN Inject 100 mg as directed once a week.    ROS:  See HPI   Objective:   Today's Vitals: BP 132/77 (BP Location: Right Arm, Patient Position: Sitting, Cuff Size: Normal)   Pulse 71   Temp (!) 97.3 F (36.3 C) (Temporal)   Resp 18   Ht 5\' 8"  (1.727 m)   Wt 195 lb (88.5 kg)   SpO2 98%   BMI 29.65 kg/m  Vitals with BMI 09/09/2020 08/27/2020 08/25/2020  Height 5\' 8"  5\' 7"  -  Weight 195 lbs 196 lbs 6 oz -  BMI 48.18 59.09 -  Systolic 311 216 244  Diastolic 77 90 70  Pulse 71 - 61     Physical Exam Vitals reviewed.  Constitutional:      Appearance: Normal appearance.  HENT:     Head: Normocephalic and atraumatic.  Abdominal:     General: Bowel sounds are normal.     Palpations: Abdomen is soft.     Tenderness: There is no abdominal tenderness.  Neurological:     Mental Status: He is alert.  Psychiatric:        Mood and Affect: Mood normal.        Behavior: Behavior normal.        Judgment: Judgment normal.          Assessment and Plan   1. Diverticulitis      Plan: 1.  Seems to be improving.  He was encouraged to continue taking his antibiotics until they are completely finished.  He was told that if his pain worsens to call this office.  He will follow-up as scheduled in 1 month  for his annual physical exam.   Tests ordered No orders of the defined types were placed in this encounter.     No orders of the defined types were placed in this encounter.   Patient to follow-up as scheduled in 1 month.  Ailene Ards, NP

## 2020-09-09 NOTE — Patient Instructions (Signed)

## 2020-09-24 ENCOUNTER — Telehealth (INDEPENDENT_AMBULATORY_CARE_PROVIDER_SITE_OTHER): Payer: Self-pay | Admitting: Nurse Practitioner

## 2020-09-24 ENCOUNTER — Encounter (INDEPENDENT_AMBULATORY_CARE_PROVIDER_SITE_OTHER): Payer: Self-pay | Admitting: Nurse Practitioner

## 2020-09-24 ENCOUNTER — Encounter: Payer: Self-pay | Admitting: Internal Medicine

## 2020-09-24 DIAGNOSIS — K5792 Diverticulitis of intestine, part unspecified, without perforation or abscess without bleeding: Secondary | ICD-10-CM

## 2020-09-24 MED ORDER — METRONIDAZOLE 500 MG PO TABS: 500.0000 mg | ORAL_TABLET | Freq: Three times a day (TID) | ORAL | 0 refills | Status: DC

## 2020-09-24 MED ORDER — CIPROFLOXACIN HCL 500 MG PO TABS: 500.0000 mg | ORAL_TABLET | Freq: Two times a day (BID) | ORAL | 0 refills | Status: DC

## 2020-09-24 NOTE — Telephone Encounter (Signed)
REturn called back. Pt is taken medication that he had remaining and pick up new rx as well. Pt will call to get appt w/ GI that was snt in early part of year. So he can get in there ASAP.

## 2020-09-24 NOTE — Telephone Encounter (Signed)
Meds for diverticulitis refilled and sent to [facility], I will also order referral to GI to see why this is reoccurring. Please notify the patient of this. Tell him if his symptoms worsen and he starts to experience severe abdominal pain, vomiting, blood in stool then he needs to go to the ER.

## 2020-09-24 NOTE — Progress Notes (Signed)
This encounter was created in error - please disregard.

## 2020-10-07 ENCOUNTER — Other Ambulatory Visit: Payer: Self-pay

## 2020-10-07 ENCOUNTER — Ambulatory Visit (INDEPENDENT_AMBULATORY_CARE_PROVIDER_SITE_OTHER): Payer: 59 | Admitting: Gastroenterology

## 2020-10-07 ENCOUNTER — Encounter: Payer: Self-pay | Admitting: Gastroenterology

## 2020-10-07 ENCOUNTER — Telehealth: Payer: Self-pay | Admitting: *Deleted

## 2020-10-07 VITALS — BP 140/79 | HR 58 | Temp 97.5°F | Ht 68.0 in | Wt 196.6 lb

## 2020-10-07 DIAGNOSIS — K5732 Diverticulitis of large intestine without perforation or abscess without bleeding: Secondary | ICD-10-CM

## 2020-10-07 DIAGNOSIS — Z86010 Personal history of colonic polyps: Secondary | ICD-10-CM | POA: Diagnosis not present

## 2020-10-07 DIAGNOSIS — K219 Gastro-esophageal reflux disease without esophagitis: Secondary | ICD-10-CM

## 2020-10-07 MED ORDER — NYSTATIN-TRIAMCINOLONE 100000-0.1 UNIT/GM-% EX OINT: 1.0000 "application " | TOPICAL_OINTMENT | Freq: Two times a day (BID) | CUTANEOUS | 1 refills | Status: DC

## 2020-10-07 NOTE — Telephone Encounter (Signed)
Called pt. He has been scheduled for TCS/EGD with Dr. Gala Romney 12/1 at 10:30am. Aware will also schedule covid test and mail with instructions.

## 2020-10-07 NOTE — Progress Notes (Signed)
Primary Care Physician:  Doree Albee, MD  Referring Physician: Dr. Anastasio Champion Primary Gastroenterologist:  Dr. Gala Romney   Chief Complaint  Patient presents with  . Diverticulitis    diagnosed 9/10. Had stopped course of medications at 1st but now is back on antibiotics. Doing better now. Burning in anus     HPI:   Jeffrey Allen is a 64 y.o. male presenting today at the request of Dr. Anastasio Champion due to history of diverticulitis. CT documented sigmoid diverticulitis noted in Sept 2021 with 2.3 cm abscess along dorsal sigmoid. Known diverticula on prior colonoscopy 2017. Multiple polyps in 2017 and overdue for surveillance.    Took Cipro and Flagyl for about a week instead of 2 weeks and quit as he felt better, then started having pain again and was given another course. Now on last day of 14 day course. Symptoms were LLQ pain, lower abdominal pain. At first thought was a kidney stone. No rectal bleeding. Had diarrhea with second course of antibiotics. Diarrhea tapered off now. Bristol stool scale #4 now. No prior episodes. No pain with BM. Burning and itching in rectum now.  Pain completely resolved. Tolerating diet. No fever/chills.   Prilosec as needed. No dysphagia. No prior EGD. Long-standing history of GERD. PUD in 2007 with CT findings of perforated ulcer felt to be NSAID related.     Past Medical History:  Diagnosis Date  . GERD (gastroesophageal reflux disease)   . Gout   . Obesity (BMI 30.0-34.9)   . Peptic ulcer disease   . Prediabetes   . Stroke [facility])     Past Surgical History:  Procedure Laterality Date  . COLONOSCOPY    . COLONOSCOPY N/A 12/07/2016   Scattered small and large-mouthed diverticula in sigmoid and descending colon. Six semi-pedunculated polyps in sigmoid and ascending colon, 4-6 mm in size. Distal TI normal appearing. Hyperplastic and tubular adenomas. 3 year surveillance recommended.     Current Outpatient Medications  Medication Sig Dispense  Refill  . allopurinol (ZYLOPRIM) 300 MG tablet Take 0.5 tablets (150 mg total) by mouth daily. 30 tablet 2  . Ascorbic Acid (VITAMIN C PO) Take 2 tablets by mouth daily.    Marland Kitchen aspirin EC 81 MG EC tablet Take 1 tablet (81 mg total) by mouth daily.    . Cholecalciferol (VITAMIN D3) 125 MCG (5000 UT) TABS Take 2-3 tablets by mouth daily.     . ciprofloxacin (CIPRO) 500 MG tablet Take 1 tablet (500 mg total) by mouth 2 (two) times daily. 20 tablet 0  . gabapentin (NEURONTIN) 100 MG capsule Take 1 capsule (100 mg total) by mouth 3 (three) times daily. (Patient taking differently: Take 100 mg by mouth at bedtime. ) 90 capsule 3  . Garlic 0960 MG CAPS Take 1,000 mg by mouth daily.    . metroNIDAZOLE (FLAGYL) 500 MG tablet Take 1 tablet (500 mg total) by mouth 3 (three) times daily. 30 tablet 0  . omeprazole (PRILOSEC) 10 MG capsule Take 10 mg by mouth daily.     . Probiotic Product (PROBIOTIC-10 PO) Take by mouth daily.    . Red Yeast Rice Extract (RED YEAST RICE PO) Take by mouth daily.     . Testosterone Cypionate 200 MG/ML SOLN Inject 100 mg as directed once a week. 10 mL 1   No current facility-administered medications for this visit.    Allergies as of 10/07/2020 - Review Complete 10/07/2020  Allergen Reaction Noted  . Prednisone  Other (See Comments) 09/04/2015    Family History  Problem Relation Age of Onset  . Diabetes Father   . Colon cancer Neg Hx     Social History   Socioeconomic History  . Marital status: Married    Spouse name: Not on file  . Number of children: Not on file  . Years of education: Not on file  . Highest education level: Not on file  Occupational History  . Not on file  Tobacco Use  . Smoking status: Former Smoker    Packs/day: 0.02    Years: 25.00    Pack years: 0.50    Types: Cigarettes    Quit date: 12/26/2006    Years since quitting: 13.7  . Smokeless tobacco: Never Used  Vaping Use  . Vaping Use: Never used  Substance and Sexual Activity  .  Alcohol use: Yes    Alcohol/week: 14.0 standard drinks    Types: 14 Cans of beer per week  . Drug use: No  . Sexual activity: Not on file  Other Topics Concern  . 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   Social Determinants of Health   Financial Resource Strain:   . Difficulty of Paying Living Expenses: Not on file  Food Insecurity:   . Worried About Charity fundraiser in the Last Year: Not on file  . Ran Out of Food in the Last Year: Not on file  Transportation Needs:   . Lack of Transportation (Medical): Not on file  . Lack of Transportation (Non-Medical): Not on file  Physical Activity:   . Days of Exercise per Week: Not on file  . Minutes of Exercise per Session: Not on file  Stress:   . Feeling of Stress : Not on file  Social Connections:   . Frequency of Communication with Friends and Family: Not on file  . Frequency of Social Gatherings with Friends and Family: Not on file  . Attends Religious Services: Not on file  . Active Member of Clubs or Organizations: Not on file  . Attends Archivist Meetings: Not on file  . Marital Status: Not on file  Intimate Partner Violence:   . Fear of Current or Ex-Partner: Not on file  . Emotionally Abused: Not on file  . Physically Abused: Not on file  . Sexually Abused: Not on file    Review of Systems: Gen: Denies any fever, chills, fatigue, weight loss, lack of appetite.  CV: Denies chest pain, heart palpitations, peripheral edema, syncope.  Resp: Denies shortness of breath at rest or with exertion. Denies wheezing or cough.  GI: see HPI GU : Denies urinary burning, urinary frequency, urinary hesitancy MS: Denies joint pain, muscle weakness, cramps, or limitation of movement.  Derm: Denies rash, itching, dry skin Psych: Denies depression, anxiety, memory loss, and confusion Heme: Denies bruising, bleeding, and enlarged lymph nodes.  Physical Exam: BP  140/79   Pulse (!) 58   Temp (!) 97.5 F (36.4 C) (Oral)   Ht 5\' 8"  (1.727 m)   Wt 196 lb 9.6 oz (89.2 kg)   BMI 29.89 kg/m  General:   Alert and oriented. Pleasant and cooperative. Well-nourished and well-developed.  Head:  Normocephalic and atraumatic. Eyes:  Without icterus, sclera clear and conjunctiva pink.  Ears:  Normal auditory acuity. Mouth:  Mask in place Lungs:  Clear to auscultation bilaterally. No wheezes, rales, or rhonchi. No distress.  Heart:  S1, S2 present without murmurs appreciated.  Abdomen:  +BS, soft, non-tender and non-distended. No HSM noted. No guarding or rebound. No masses appreciated.  Rectal:  Well-demarcated erythema in gluteal folds. No external hemorrhoids.  Msk:  Symmetrical without gross deformities. Normal posture. Extremities:  Without edema. Neurologic:  Alert and  oriented x4;  grossly normal neurologically. Skin:  Intact without significant lesions or rashes. Psych:  Alert and cooperative. Normal mood and affect.  ASSESSMENT: Jeffrey Allen is a 64 y.o. male presenting today with history of CT documented sigmoid diverticulitis noted in Sept 2021 with 2.3 cm abscess along dorsal sigmoid aspect. Known diverticula on prior colonoscopy 2017. Multiple polyps in 2017 and overdue for surveillance. He is completing antibiotic therapy and had complete resolution of symptoms at this time. Physical exam unrevealing.  He also notes a history of PUD in 2007 with CT findings of perforated ulcer that were felt to be NSAID-related at that time. No prior EGD. Chronic history of GERD.   Incidentally noting peri-rectal discomfort, with findings of fungal-type irritation between buttocks. He does report working outside in the heat, extensive sweating, and likely this was thriving in a moist environment.   Will pursue colonoscopy in light of history of polyps and recent diverticulitis on CT. He is to call if any recurrent pain and would need imaging. Will also pursue  EGD at time of colonoscopy for Barrett's screening, as he has several risk factors.     PLAN:  Proceed with colonoscopy/EGD by Dr. Gala Romney in near future: the risks, benefits, and alternatives have been discussed with the patient in detail. The patient states understanding and desires to proceed.  Mycology ointment sent to pharmacy for affected area  Annitta Needs, PhD, [facility] Gastroenter[facility]

## 2020-10-07 NOTE — Patient Instructions (Signed)
We are arranging a colonoscopy and upper endoscopy with Dr. Gala Romney in the near future.  I have sent in a cream to apply between buttocks that is irritated. Please use twice a day. Call if no improvement with this.  We will see you in follow-up thereafter!  It was a pleasure to see you today. I want to create trusting relationships with patients to provide genuine, compassionate, and quality care. I value your feedback. If you receive a survey regarding your visit,  I greatly appreciate you taking time to fill this out.   Annitta Needs, PhD, ANP-BC [facility] Gastroenterology

## 2020-10-15 ENCOUNTER — Other Ambulatory Visit: Payer: Self-pay

## 2020-10-15 ENCOUNTER — Ambulatory Visit (INDEPENDENT_AMBULATORY_CARE_PROVIDER_SITE_OTHER): Payer: 59 | Admitting: Internal Medicine

## 2020-10-15 ENCOUNTER — Encounter (INDEPENDENT_AMBULATORY_CARE_PROVIDER_SITE_OTHER): Payer: Self-pay | Admitting: Internal Medicine

## 2020-10-15 VITALS — BP 120/82 | HR 65 | Temp 97.7°F | Ht 66.0 in | Wt 193.4 lb

## 2020-10-15 DIAGNOSIS — M1000 Idiopathic gout, unspecified site: Secondary | ICD-10-CM | POA: Diagnosis not present

## 2020-10-15 DIAGNOSIS — I10 Essential (primary) hypertension: Secondary | ICD-10-CM | POA: Diagnosis not present

## 2020-10-15 DIAGNOSIS — E291 Testicular hypofunction: Secondary | ICD-10-CM

## 2020-10-15 DIAGNOSIS — K5792 Diverticulitis of intestine, part unspecified, without perforation or abscess without bleeding: Secondary | ICD-10-CM

## 2020-10-15 DIAGNOSIS — G5601 Carpal tunnel syndrome, right upper limb: Secondary | ICD-10-CM

## 2020-10-15 DIAGNOSIS — Z23 Encounter for immunization: Secondary | ICD-10-CM

## 2020-10-15 MED ORDER — TESTOSTERONE CYPIONATE 200 MG/ML IM SOLN: 100.0000 mg | INTRAMUSCULAR | 2 refills | Status: DC

## 2020-10-15 NOTE — Progress Notes (Signed)
Cc'ed to pcp °

## 2020-10-15 NOTE — Addendum Note (Signed)
Addended by: Sheffield Slider L on: 10/15/2020 10:10 AM   Modules accepted: Orders

## 2020-10-15 NOTE — Progress Notes (Signed)
Metrics: Intervention Frequency ACO  Documented Smoking Status Yearly  Screened one or more times in 24 months  Cessation Counseling or  Active cessation medication Past 24 months  Past 24 months   Guideline developer: UpToDate (See UpToDate for funding source) Date Released: 2014       Wellness Office Visit  Subjective:  Patient ID: Jeffrey Allen, male    DOB: 1956/10/03  Age: 64 y.o. MRN: [id number]  CC: This man was scheduled for an annual physical today but there is no phlebotomist in the office and he would rather postpone this for another day. We did a follow-up visit regarding his hypertension, hypogonadism, gout, recent history of diverticulitis. HPI Thankfully, he has recovered from his acute diverticulitis episode but gastroenterology wants him to have a colonoscopy and this will be done in the beginning of December. As far as his hypertension is concerned, this is not really a major problem at the present time and is not on any medications. His hypogonadism is treated with testosterone therapy and is done very well with this. He continues to take gabapentin for his carpal tunnel syndrome symptoms which seem to have helped him at least 75% he says. He continues to take allopurinol for his gout prevention.  Past Medical History:  Diagnosis Date  . GERD (gastroesophageal reflux disease)   . Gout   . Obesity (BMI 30.0-34.9)   . Peptic ulcer disease   . Prediabetes   . Stroke [facility])    Past Surgical History:  Procedure Laterality Date  . COLONOSCOPY    . COLONOSCOPY N/A 12/07/2016   Scattered small and large-mouthed diverticula in sigmoid and descending colon. Six semi-pedunculated polyps in sigmoid and ascending colon, 4-6 mm in size. Distal TI normal appearing. Hyperplastic and tubular adenomas. 3 year surveillance recommended.      Family History  Problem Relation Age of Onset  . Diabetes Father   . 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   Social History   Tobacco Use  . Smoking status: Former Smoker    Packs/day: 0.02    Years: 25.00    Pack years: 0.50    Types: Cigarettes    Quit date: 12/26/2006    Years since quitting: 13.8  . Smokeless tobacco: Never Used  Substance Use Topics  . Alcohol use: Yes    Comment: beer twice per week, 2 at a time.     Current Meds  Medication Sig  . allopurinol (ZYLOPRIM) 300 MG tablet Take 0.5 tablets (150 mg total) by mouth daily.  . Ascorbic Acid (VITAMIN C PO) Take 2 tablets by mouth daily.  Marland Kitchen aspirin EC 81 MG EC tablet Take 1 tablet (81 mg total) by mouth daily.  . Cholecalciferol (VITAMIN D3) 125 MCG (5000 UT) TABS Take 2-3 tablets by mouth daily.   Marland Kitchen gabapentin (NEURONTIN) 100 MG capsule Take 1 capsule (100 mg total) by mouth 3 (three) times daily. (Patient taking differently: Take 100 mg by mouth at bedtime. )  . Garlic 4196 MG CAPS Take 1,000 mg by mouth daily.  Marland Kitchen nystatin-triamcinolone ointment (MYCOLOG) Apply 1 application topically 2 (two) times daily. To affected skin  . omeprazole (PRILOSEC) 10 MG capsule Take 10 mg by mouth daily.   . Probiotic Product (PROBIOTIC-10 PO) Take by mouth daily.  . Red Yeast Rice Extract (RED YEAST RICE PO) Take by mouth daily.   Marland Kitchen  testosterone cypionate (DEPOTESTOSTERONE CYPIONATE) 200 MG/ML injection Inject 0.5 mLs (100 mg total) into the muscle once a week.  . [DISCONTINUED] testosterone cypionate (DEPOTESTOSTERONE CYPIONATE) 200 MG/ML injection SMARTSIG:0.5 Milliliter(s) IM Once a Week  . [DISCONTINUED] Testosterone Cypionate 200 MG/ML SOLN Inject 100 mg as directed once a week.      Depression screen [facility] 2/9 08/24/2020 02/10/2020  Decreased Interest 0 0  Down, Depressed, Hopeless 0 0  PHQ - 2 Score 0 0     Objective:   Today's Vitals: BP 120/82   Pulse 65   Temp 97.7 F (36.5 C) (Temporal)   Ht 5\' 6"  (1.676 m)   Wt 193 lb 6.4 oz (87.7 kg)   SpO2  97%   BMI 31.22 kg/m  Vitals with BMI 10/15/2020 10/07/2020 09/09/2020  Height 5\' 6"  5\' 8"  5\' 8"   Weight 193 lbs 6 oz 196 lbs 10 oz 195 lbs  BMI 31.23 14.9 70.26  Systolic 378 588 502  Diastolic 82 79 77  Pulse 65 58 71     Physical Exam He looks systemically well.  Blood pressure is well controlled.  He has lost about 3 pounds since last visit.  Alert and orientated without any focal neurological signs.      Assessment   1. Testicular failure   2. Essential hypertension   3. Diverticulitis   4. Acute idiopathic gout, unspecified site   5. Carpal tunnel syndrome of right wrist       Tests ordered No orders of the defined types were placed in this encounter.    Plan: 1. He will continue with testosterone therapy and I have sent him a refill today. 2. We will continue to monitor his blood pressure but seems to be well controlled at the present time without medications. 3. He will continue with allopurinol for prevention of gout. 4. He will continue with gabapentin for symptoms of carpal tunnel syndrome.  Follow-up with gastroenterology and colonoscopy soon. 5. I will see him next February for an annual physical exam.  He was given influenza vaccination today.   Meds ordered this encounter  Medications  . testosterone cypionate (DEPOTESTOSTERONE CYPIONATE) 200 MG/ML injection    Sig: Inject 0.5 mLs (100 mg total) into the muscle once a week.    Dispense:  10 mL    Refill:  2    Brazil Voytko Luther Parody, MD

## 2020-11-24 ENCOUNTER — Other Ambulatory Visit: Payer: Self-pay

## 2020-11-24 ENCOUNTER — Other Ambulatory Visit (HOSPITAL_COMMUNITY)
Admission: RE | Admit: 2020-11-24 | Discharge: 2020-11-24 | Disposition: A | Discharge status: Home / Self Care | Payer: 59 | Source: Ambulatory Visit | Attending: Internal Medicine | Admitting: Internal Medicine

## 2020-11-24 DIAGNOSIS — Z01812 Encounter for preprocedural laboratory examination: Secondary | ICD-10-CM | POA: Insufficient documentation

## 2020-11-24 DIAGNOSIS — Z20822 Contact with and (suspected) exposure to covid-19: Secondary | ICD-10-CM | POA: Insufficient documentation

## 2020-11-24 LAB — SARS CORONAVIRUS 2 (TAT 6-24 HRS): SARS Coronavirus 2: NEGATIVE

## 2020-11-25 ENCOUNTER — Other Ambulatory Visit: Payer: Self-pay

## 2020-11-25 ENCOUNTER — Encounter (HOSPITAL_COMMUNITY)
Admission: RE | Disposition: A | Discharge status: Home / Self Care | Payer: Self-pay | Source: Home / Self Care | Attending: Internal Medicine

## 2020-11-25 ENCOUNTER — Ambulatory Visit (HOSPITAL_COMMUNITY)
Admission: RE | Admit: 2020-11-25 | Discharge: 2020-11-25 | Disposition: A | Discharge status: Home / Self Care | Payer: 59 | Attending: Internal Medicine | Admitting: Internal Medicine

## 2020-11-25 DIAGNOSIS — K5730 Diverticulosis of large intestine without perforation or abscess without bleeding: Secondary | ICD-10-CM | POA: Diagnosis not present

## 2020-11-25 DIAGNOSIS — K2950 Unspecified chronic gastritis without bleeding: Secondary | ICD-10-CM | POA: Insufficient documentation

## 2020-11-25 DIAGNOSIS — Z79899 Other long term (current) drug therapy: Secondary | ICD-10-CM | POA: Insufficient documentation

## 2020-11-25 DIAGNOSIS — K3189 Other diseases of stomach and duodenum: Secondary | ICD-10-CM | POA: Insufficient documentation

## 2020-11-25 DIAGNOSIS — Z7982 Long term (current) use of aspirin: Secondary | ICD-10-CM | POA: Diagnosis not present

## 2020-11-25 DIAGNOSIS — K635 Polyp of colon: Secondary | ICD-10-CM | POA: Insufficient documentation

## 2020-11-25 DIAGNOSIS — Z87891 Personal history of nicotine dependence: Secondary | ICD-10-CM | POA: Insufficient documentation

## 2020-11-25 DIAGNOSIS — Z86010 Personal history of colonic polyps: Secondary | ICD-10-CM | POA: Diagnosis not present

## 2020-11-25 DIAGNOSIS — K766 Portal hypertension: Secondary | ICD-10-CM | POA: Diagnosis not present

## 2020-11-25 DIAGNOSIS — Z1211 Encounter for screening for malignant neoplasm of colon: Secondary | ICD-10-CM | POA: Insufficient documentation

## 2020-11-25 DIAGNOSIS — Z888 Allergy status to other drugs, medicaments and biological substances status: Secondary | ICD-10-CM | POA: Insufficient documentation

## 2020-11-25 DIAGNOSIS — K219 Gastro-esophageal reflux disease without esophagitis: Secondary | ICD-10-CM | POA: Diagnosis present

## 2020-11-25 HISTORY — PX: COLONOSCOPY: SHX5424

## 2020-11-25 HISTORY — PX: BIOPSY: SHX5522

## 2020-11-25 HISTORY — PX: ESOPHAGOGASTRODUODENOSCOPY: SHX5428

## 2020-11-25 HISTORY — PX: POLYPECTOMY: SHX5525

## 2020-11-25 SURGERY — COLONOSCOPY
Anesthesia: Moderate Sedation

## 2020-11-25 MED ORDER — MEPERIDINE HCL 100 MG/ML IJ SOLN
INTRAMUSCULAR | Status: DC | PRN
  Administered 2020-11-25: 25 mg
  Administered 2020-11-25: 15 mg
  Administered 2020-11-25: 10 mg

## 2020-11-25 MED ORDER — LIDOCAINE VISCOUS HCL 2 % MT SOLN
OROMUCOSAL | Status: DC | PRN
  Administered 2020-11-25: 1 via OROMUCOSAL

## 2020-11-25 MED ORDER — MIDAZOLAM HCL 5 MG/5ML IJ SOLN
INTRAMUSCULAR | Status: AC
  Filled 2020-11-25: qty 10

## 2020-11-25 MED ORDER — ONDANSETRON HCL 4 MG/2ML IJ SOLN
INTRAMUSCULAR | Status: AC
  Filled 2020-11-25: qty 2

## 2020-11-25 MED ORDER — MEPERIDINE HCL 50 MG/ML IJ SOLN
INTRAMUSCULAR | Status: AC
  Filled 2020-11-25: qty 1

## 2020-11-25 MED ORDER — SODIUM CHLORIDE 0.9 % IV SOLN: INTRAVENOUS | Status: DC

## 2020-11-25 MED ORDER — ONDANSETRON HCL 4 MG/2ML IJ SOLN
INTRAMUSCULAR | Status: DC | PRN
  Administered 2020-11-25: 4 mg via INTRAVENOUS

## 2020-11-25 MED ORDER — MIDAZOLAM HCL 5 MG/5ML IJ SOLN
INTRAMUSCULAR | Status: DC | PRN
  Administered 2020-11-25: 1 mg via INTRAVENOUS
  Administered 2020-11-25 (×2): 2 mg via INTRAVENOUS
  Administered 2020-11-25: 1 mg via INTRAVENOUS

## 2020-11-25 MED ORDER — LIDOCAINE VISCOUS HCL 2 % MT SOLN
OROMUCOSAL | Status: AC
  Filled 2020-11-25: qty 15

## 2020-11-25 NOTE — Op Note (Signed)
[facility] Patient Name: Jeffrey Allen Procedure Date: 11/25/2020 9:48 AM MRN: [id number] Date of Birth: 1956-12-18 Attending MD: Norvel Richards , MD CSN: [id number] Age: 64 Admit Type: Outpatient Procedure:                Upper GI endoscopy Indications:              Suspected esophageal reflux Providers:                Norvel Richards, MD, Janeece Riggers, RN, Raphael Gibney, Technician Referring MD:              Medicines:                Midazolam 4 mg IV, Meperidine 40 mg IV Complications:            No immediate complications. Estimated Blood Loss:     Estimated blood loss was minimal. Procedure:                Pre-Anesthesia Assessment:                           - Prior to the procedure, a History and Physical                            was performed, and patient medications and                            allergies were reviewed. The patient's tolerance of                            previous anesthesia was also reviewed. The risks                            and benefits of the procedure and the sedation                            options and risks were discussed with the patient.                            All questions were answered, and informed consent                            was obtained. Prior Anticoagulants: The patient has                            taken no previous anticoagulant or antiplatelet                            agents. ASA Grade Assessment: II - A patient with                            mild systemic disease. After reviewing the risks  and benefits, the patient was deemed in                            satisfactory condition to undergo the procedure.                           After obtaining informed consent, the endoscope was                            passed under direct vision. Throughout the                            procedure, the patient's blood pressure, pulse, and                             oxygen saturations were monitored continuously. The                            [id number]-H190 ([id number]) scope was introduced through the                            mouth, and advanced to the second part of duodenum.                            The upper GI endoscopy was accomplished without                            difficulty. The patient tolerated the procedure                            well. Scope In: 10:08:16 AM Scope Out: 10:12:27 AM Total Procedure Duration: 0 hours 4 minutes 11 seconds  Findings:      The examined esophagus was normal.      polypoid gasstric mucosa with fish scale appearnce suspicious for mild       portal hypertensive gastropathy was found in the entire examined       stomach. Otherwise, unremarkable gastric mucosa. This was biopsied with       a cold forceps for histology. Estimated blood loss was minimal.      The duodenal bulb and second portion of the duodenum were normal. Impression:               - Normal esophagus.                           - Portal hypertensive gastropathy. Biopsied.                           - Normal duodenal bulb and second portion of the                            duodenum. Moderate Sedation:      Moderate (conscious) sedation was administered by the endoscopy nurse       and supervised by the endoscopist. The following parameters were       monitored: oxygen saturation, heart rate, blood  pressure, respiratory       rate, EKG, adequacy of pulmonary ventilation, and response to care.       Total physician intraservice time was 19 minutes. Recommendation:           - Patient has a contact number available for                            emergencies. The signs and symptoms of potential                            delayed complications were discussed with the                            patient. Return to normal activities tomorrow.                            Written discharge instructions were provided to the                             patient.                           - Resume previous diet.                           - Continue present medications. Begin protonix 40                            mg daily                           - See colonoscopy report Procedure Code(s):        --- Professional ---                           [phone], Esophagogastroduodenoscopy, flexible,                            transoral; with biopsy, single or multiple                           G0500, Moderate sedation services provided by the                            same physician or other qualified health care                            professional performing a gastrointestinal                            endoscopic service that sedation supports,                            requiring the presence of an independent trained  observer to assist in the monitoring of the                            patient's level of consciousness and physiological                            status; initial 15 minutes of intra-service time;                            patient age 8 years or older (additional time may                            be reported with [phone], as appropriate) Diagnosis Code(s):        --- Professional ---                           K76.6, Portal hypertension                           K31.89, Other diseases of stomach and duodenum CPT copyright 2019 American [employer] Association. All rights reserved. The codes documented in this report are preliminary and upon coder review may  be revised to meet current compliance requirements. Cristopher Estimable. Karman Veney, MD Norvel Richards, MD 11/25/2020 10:36:58 AM This report has been signed electronically. Number of Addenda: 0

## 2020-11-25 NOTE — Op Note (Signed)
[facility] Patient Name: Jeffrey Allen Procedure Date: 11/25/2020 10:16 AM MRN: [id number] Date of Birth: [birth year] Attending MD: Norvel Richards , MD CSN: [id number] Age: 64 Admit Type: Outpatient Procedure:                Colonoscopy Indications:              High risk colon cancer surveillance: Personal                            history of colonic polyps Providers:                Norvel Richards, MD, Janeece Riggers, RN, Raphael Gibney, Technician Referring MD:              Medicines:                Midazolam 5 mg IV, Meperidine 50 mg IV Complications:            No immediate complications. Estimated Blood Loss:     Estimated blood loss was minimal. Procedure:                Pre-Anesthesia Assessment:                           - Prior to the procedure, a History and Physical                            was performed, and patient medications and                            allergies were reviewed. The patient's tolerance of                            previous anesthesia was also reviewed. The risks                            and benefits of the procedure and the sedation                            options and risks were discussed with the patient.                            All questions were answered, and informed consent                            was obtained. Prior Anticoagulants: The patient has                            taken no previous anticoagulant or antiplatelet                            agents. ASA Grade Assessment: II - A patient with  mild systemic disease. After reviewing the risks                            and benefits, the patient was deemed in                            satisfactory condition to undergo the procedure.                           - Prior to the procedure, a History and Physical                            was performed, and patient medications and                            allergies  were reviewed. The patient's tolerance of                            previous anesthesia was also reviewed. The risks                            and benefits of the procedure and the sedation                            options and risks were discussed with the patient.                            All questions were answered, and informed consent                            was obtained. Prior Anticoagulants: The patient has                            taken no previous anticoagulant or antiplatelet                            agents. ASA Grade Assessment: II - A patient with                            mild systemic disease. After reviewing the risks                            and benefits, the patient was deemed in                            satisfactory condition to undergo the procedure.                           After obtaining informed consent, the colonoscope                            was passed under direct vision. Throughout the  procedure, the patient's blood pressure, pulse, and                            oxygen saturations were monitored continuously. The                            CF-HQ190L ([id number]) scope was introduced through                            the anus and advanced to the the cecum, identified                            by appendiceal orifice and ileocecal valve. The                            colonoscopy was performed without difficulty. The                            patient tolerated the procedure well. The quality                            of the bowel preparation was adequate. Scope In: 10:19:14 AM Scope Out: 10:35:04 AM Scope Withdrawal Time: 0 hours 13 minutes 0 seconds  Total Procedure Duration: 0 hours 15 minutes 50 seconds  Findings:      The perianal and digital rectal examinations were normal.      Scattered medium-mouthed diverticula were found in the sigmoid colon and       descending colon.      Three sessile polyps were found  in the recto-sigmoid colon and hepatic       flexure. The polyps were 3 to 4 mm in size. These polyps were removed       with a cold snare. Resection and retrieval were complete. Estimated       blood loss was minimal.      The exam was otherwise without abnormality on direct and retroflexion       views. Impression:               - Diverticulosis in the sigmoid colon and in the                            descending colon.                           - Three 3 to 4 mm polyps at the recto-sigmoid colon                            and at the hepatic flexure, removed with a cold                            snare. Resected and retrieved.                           - The examination was otherwise normal on direct  and retroflexion views. Moderate Sedation:      Moderate (conscious) sedation was administered by the endoscopy nurse       and supervised by the endoscopist. The following parameters were       monitored: oxygen saturation, heart rate, blood pressure, respiratory       rate, EKG, adequacy of pulmonary ventilation, and response to care.       Total physician intraservice time was 22 minutes. Recommendation:           - Patient has a contact number available for                            emergencies. The signs and symptoms of potential                            delayed complications were discussed with the                            patient. Return to normal activities tomorrow.                            Written discharge instructions were provided to the                            patient.                           - Advance diet as tolerated.                           - Continue present medications.                           - Repeat colonoscopy date to be determined after                            pending pathology results are reviewed for                            surveillance based on pathology results.                           - Return to GI office  in 2 months. See EGD report Procedure Code(s):        --- Professional ---                           [phone], Colonoscopy, flexible; with removal of                            tumor(s), polyp(s), or other lesion(s) by snare                            technique                           G0500, Moderate sedation services provided by the  same physician or other qualified health care                            professional performing a gastrointestinal                            endoscopic service that sedation supports,                            requiring the presence of an independent trained                            observer to assist in the monitoring of the                            patient's level of consciousness and physiological                            status; initial 15 minutes of intra-service time;                            patient age 25 years or older (additional time may                            be reported with [phone], as appropriate) Diagnosis Code(s):        --- Professional ---                           Z86.010, Personal history of colonic polyps                           K63.5, Polyp of colon                           K57.30, Diverticulosis of large intestine without                            perforation or abscess without bleeding CPT copyright 2019 American [employer]. All rights reserved. The codes documented in this report are preliminary and upon coder review may  be revised to meet current compliance requirements. Cristopher Estimable. Kinsey Cowsert, MD Norvel Richards, MD 11/25/2020 10:47:21 AM This report has been signed electronically. Number of Addenda: 0

## 2020-11-25 NOTE — Discharge Instructions (Signed)
EGD Discharge instructions Please read the instructions outlined below and refer to this sheet in the next few weeks. These discharge instructions provide you with general information on caring for yourself after you leave the hospital. Your doctor may also give you specific instructions. While your treatment has been planned according to the most current medical practices available, unavoidable complications occasionally occur. If you have any problems or questions after discharge, please call your doctor. ACTIVITY  You may resume your regular activity but move at a slower pace for the next 24 hours.   Take frequent rest periods for the next 24 hours.   Walking will help expel (get rid of) the air and reduce the bloated feeling in your abdomen.   No driving for 24 hours (because of the anesthesia (medicine) used during the test).   You may shower.   Do not sign any important legal documents or operate any machinery for 24 hours (because of the anesthesia used during the test).  NUTRITION  Drink plenty of fluids.   You may resume your normal diet.   Begin with a light meal and progress to your normal diet.   Avoid alcoholic beverages for 24 hours or as instructed by your caregiver.  MEDICATIONS  You may resume your normal medications unless your caregiver tells you otherwise.  WHAT YOU CAN EXPECT TODAY  You may experience abdominal discomfort such as a feeling of fullness or "gas" pains.  FOLLOW-UP  Your doctor will discuss the results of your test with you.  SEEK IMMEDIATE MEDICAL ATTENTION IF ANY OF THE FOLLOWING OCCUR:  Excessive nausea (feeling sick to your stomach) and/or vomiting.   Severe abdominal pain and distention (swelling).   Trouble swallowing.   Temperature over 101 F (37.8 C).   Rectal bleeding or vomiting of blood.   Colonoscopy Discharge Instructions  Read the instructions outlined below and refer to this sheet in the next few weeks. These  discharge instructions provide you with general information on caring for yourself after you leave the hospital. Your doctor may also give you specific instructions. While your treatment has been planned according to the most current medical practices available, unavoidable complications occasionally occur. If you have any problems or questions after discharge, call Dr. Gala Romney at [phone]. ACTIVITY  You may resume your regular activity, but move at a slower pace for the next 24 hours.   Take frequent rest periods for the next 24 hours.   Walking will help get rid of the air and reduce the bloated feeling in your belly (abdomen).   No driving for 24 hours (because of the medicine (anesthesia) used during the test).    Do not sign any important legal documents or operate any machinery for 24 hours (because of the anesthesia used during the test).  NUTRITION  Drink plenty of fluids.   You may resume your normal diet as instructed by your doctor.   Begin with a light meal and progress to your normal diet. Heavy or fried foods are harder to digest and may make you feel sick to your stomach (nauseated).   Avoid alcoholic beverages for 24 hours or as instructed.  MEDICATIONS  You may resume your normal medications unless your doctor tells you otherwise.  WHAT YOU CAN EXPECT TODAY  Some feelings of bloating in the abdomen.   Passage of more gas than usual.   Spotting of blood in your stool or on the toilet paper.  IF YOU HAD POLYPS REMOVED DURING THE COLONOSCOPY:  No aspirin products for 7 days or as instructed.   No alcohol for 7 days or as instructed.   Eat a soft diet for the next 24 hours.  FINDING OUT THE RESULTS OF YOUR TEST Not all test results are available during your visit. If your test results are not back during the visit, make an appointment with your caregiver to find out the results. Do not assume everything is normal if you have not heard from your caregiver or the  medical facility. It is important for you to follow up on all of your test results.  SEEK IMMEDIATE MEDICAL ATTENTION IF:  You have more than a spotting of blood in your stool.   Your belly is swollen (abdominal distention).   You are nauseated or vomiting.   You have a temperature over 101.   You have abdominal pain or discomfort that is severe or gets worse throughout the day.    Colon Polyps  Polyps are tissue growths inside the body. Polyps can grow in many places, including the large intestine (colon). A polyp may be a round bump or a mushroom-shaped growth. You could have one polyp or several. Most colon polyps are noncancerous (benign). However, some colon polyps can become cancerous over time. Finding and removing the polyps early can help prevent this. What are the causes? The exact cause of colon polyps is not known. What increases the risk? You are more likely to develop this condition if you:  Have a family history of colon cancer or colon polyps.  Are older than 40 or older than 45 if you are African American.  Have inflammatory bowel disease, such as ulcerative colitis or Crohn's disease.  Have certain hereditary conditions, such as: ? Familial adenomatous polyposis. ? Lynch syndrome. ? Turcot syndrome. ? Peutz-Jeghers syndrome.  Are overweight.  Smoke cigarettes.  Do not get enough exercise.  Drink too much alcohol.  Eat a diet that is high in fat and red meat and low in fiber.  Had childhood cancer that was treated with abdominal radiation. What are the signs or symptoms? Most polyps do not cause symptoms. If you have symptoms, they may include:  Blood coming from your rectum when having a bowel movement.  Blood in your stool. The stool may look dark red or black.  Abdominal pain.  A change in bowel habits, such as constipation or diarrhea. How is this diagnosed? This condition is diagnosed with a colonoscopy. This is a procedure in which a  lighted, flexible scope is inserted into the anus and then passed into the colon to examine the area. Polyps are sometimes found when a colonoscopy is done as part of routine cancer screening tests. How is this treated? Treatment for this condition involves removing any polyps that are found. Most polyps can be removed during a colonoscopy. Those polyps will then be tested for cancer. Additional treatment may be needed depending on the results of testing. Follow these instructions at home: Lifestyle  Maintain a healthy weight, or lose weight if recommended by your health care provider.  Exercise every day or as told by your health care provider.  Do not use any products that contain nicotine or tobacco, such as cigarettes and e-cigarettes. If you need help quitting, ask your health care provider.  If you drink alcohol, limit how much you have: ? 0-1 drink a day for women. ? 0-2 drinks a day for men.  Be aware of how much alcohol is in your drink. In  the [place] one drink equals one 12 oz bottle of beer (355 mL), one 5 oz glass of wine (148 mL), or one 1 oz shot of hard liquor (44 mL). Eating and drinking   Eat foods that are high in fiber, such as fruits, vegetables, and whole grains.  Eat foods that are high in calcium and vitamin D, such as milk, cheese, yogurt, eggs, liver, fish, and broccoli.  Limit foods that are high in fat, such as fried foods and desserts.  Limit the amount of red meat and processed meat you eat, such as hot dogs, sausage, bacon, and lunch meats. General instructions  Keep all follow-up visits as told by your health care provider. This is important. ? This includes having regularly scheduled colonoscopies. ? Talk to your health care provider about when you need a colonoscopy. Contact a health care provider if:  You have new or worsening bleeding during a bowel movement.  You have new or increased blood in your stool.  You have a change in bowel  habits.  You lose weight for no known reason. Summary  Polyps are tissue growths inside the body. Polyps can grow in many places, including the colon.  Most colon polyps are noncancerous (benign), but some can become cancerous over time.  This condition is diagnosed with a colonoscopy.  Treatment for this condition involves removing any polyps that are found. Most polyps can be removed during a colonoscopy. This information is not intended to replace advice given to you by your health care provider. Make sure you discuss any questions you have with your health care provider. Document Revised: 03/29/2018 Document Reviewed: 03/29/2018 Elsevier Patient Education  [place].  Diverticulosis  Diverticulosis is a condition that develops when small pouches (diverticula) form in the wall of the large intestine (colon). The colon is where water is absorbed and stool (feces) is formed. The pouches form when the inside layer of the colon pushes through weak spots in the outer layers of the colon. You may have a few pouches or many of them. The pouches usually do not cause problems unless they become inflamed or infected. When this happens, the condition is called diverticulitis. What are the causes? The cause of this condition is not known. What increases the risk? The following factors may make you more likely to develop this condition:  Being older than age 68. Your risk for this condition increases with age. Diverticulosis is rare among people younger than age 79. By age 44, many people have it.  Eating a low-fiber diet.  Having frequent constipation.  Being overweight.  Not getting enough exercise.  Smoking.  Taking over-the-counter pain medicines, like aspirin and ibuprofen.  Having a family history of diverticulosis. What are the signs or symptoms? In most people, there are no symptoms of this condition. If you do have symptoms, they may include:  Bloating.  Cramps in  the abdomen.  Constipation or diarrhea.  Pain in the lower left side of the abdomen. How is this diagnosed? Because diverticulosis usually has no symptoms, it is most often diagnosed during an exam for other colon problems. The condition may be diagnosed by:  Using a flexible scope to examine the colon (colonoscopy).  Taking an X-ray of the colon after dye has been put into the colon (barium enema).  Having a CT scan. How is this treated? You may not need treatment for this condition. Your health care provider may recommend treatment to prevent problems. You may need treatment  if you have symptoms or if you previously had diverticulitis. Treatment may include:  Eating a high-fiber diet.  Taking a fiber supplement.  Taking a live bacteria supplement (probiotic).  Taking medicine to relax your colon. Follow these instructions at home: Medicines  Take over-the-counter and prescription medicines only as told by your health care provider.  If told by your health care provider, take a fiber supplement or probiotic. Constipation prevention Your condition may cause constipation. To prevent or treat constipation, you may need to:  Drink enough fluid to keep your urine pale yellow.  Take over-the-counter or prescription medicines.  Eat foods that are high in fiber, such as beans, whole grains, and fresh fruits and vegetables.  Limit foods that are high in fat and processed sugars, such as fried or sweet foods.  General instructions  Try not to strain when you have a bowel movement.  Keep all follow-up visits as told by your health care provider. This is important. Contact a health care provider if you:  Have pain in your abdomen.  Have bloating.  Have cramps.  Have not had a bowel movement in 3 days. Get help right away if:  Your pain gets worse.  Your bloating becomes very bad.  You have a fever or chills, and your symptoms suddenly get worse.  You vomit.  You  have bowel movements that are bloody or black.  You have bleeding from your rectum. Summary  Diverticulosis is a condition that develops when small pouches (diverticula) form in the wall of the large intestine (colon).  You may have a few pouches or many of them.  This condition is most often diagnosed during an exam for other colon problems.  Treatment may include increasing the fiber in your diet, taking supplements, or taking medicines. This information is not intended to replace advice given to you by your health care provider. Make sure you discuss any questions you have with your health care provider. Document Revised: 07/11/2019 Document Reviewed: 07/11/2019 Elsevier Patient Education  [place] was inflamed.  It was biopsied.  Polyps removed from your colon.  Colon polyp and diverticulosis information provided  Begin Protonix 40 mg daily for acid reflux  Office visit with [place] in 2 months  Further recommendations to follow pending review of pathology report  At patient request, I called Coralyn Mark at [phone] -left detailed message on answering service

## 2020-11-25 NOTE — H&P (Signed)
@LOGO @   Primary Care Physician:  Doree Albee, MD Primary Gastroenterologist:  Dr. Gala Romney  Pre-Procedure History & Physical: HPI:  Jeffrey Allen is a 64 y.o. male here for further evaluation of longstanding GERD via EGD.  No dysphagia.  History of multiple colonic adenomas removed 1696; complicated diverticulitis earlier this year with small abscess formation along the sigmoid segment.  Clinically recovered.  Overdue for surveillance colonoscopy.  Past Medical History:  Diagnosis Date  . GERD (gastroesophageal reflux disease)   . Gout   . Obesity (BMI 30.0-34.9)   . Peptic ulcer disease   . Prediabetes   . Stroke [facility])     Past Surgical History:  Procedure Laterality Date  . COLONOSCOPY    . COLONOSCOPY N/A 12/07/2016   Scattered small and large-mouthed diverticula in sigmoid and descending colon. Six semi-pedunculated polyps in sigmoid and ascending colon, 4-6 mm in size. Distal TI normal appearing. Hyperplastic and tubular adenomas. 3 year surveillance recommended.     Prior to Admission medications   Medication Sig Start Date End Date Taking? Authorizing Provider  allopurinol (ZYLOPRIM) 300 MG tablet Take 0.5 tablets (150 mg total) by mouth daily. 03/30/20  Yes Gosrani, Nimish C, MD  aspirin EC 81 MG EC tablet Take 1 tablet (81 mg total) by mouth daily. 01/01/20  Yes Tat, Shanon Brow, MD  Cholecalciferol (VITAMIN D3) 125 MCG (5000 UT) TABS Take 10,000 Units by mouth daily.    Yes [provider]  gabapentin (NEURONTIN) 100 MG capsule Take 1 capsule (100 mg total) by mouth 3 (three) times daily. Patient taking differently: Take 100 mg by mouth at bedtime.  04/21/20  Yes Gosrani, Nimish C, MD  Garlic 7893 MG CAPS Take 1,000 mg by mouth daily.   Yes [provider]  omeprazole (PRILOSEC) 10 MG capsule Take 10 mg by mouth every 3 (three) days.    Yes [provider]  Probiotic Product (PROBIOTIC-10 PO) Take 1 tablet by mouth daily. 18 thousand   Yes [provider]  Red Yeast Rice Extract (RED YEAST RICE PO) Take by mouth daily.    Yes [provider]  vitamin B-12 (CYANOCOBALAMIN) 1000 MCG tablet Take 1,000 mcg by mouth daily.   Yes [provider]  testosterone cypionate (DEPOTESTOSTERONE CYPIONATE) 200 MG/ML injection Inject 0.5 mLs (100 mg total) into the muscle once a week. 10/15/20   Doree Albee, MD    Allergies as of 10/07/2020 - Review Complete 10/07/2020  Allergen Reaction Noted  . Prednisone Other (See Comments) 09/04/2015    Family History  Problem Relation Age of Onset  . Diabetes Father   . Colon cancer Neg Hx     Social History   Socioeconomic History  . Marital status: Married    Spouse name: Not on file  . Number of children: Not on file  . Years of education: Not on file  . Highest education level: Not on file  Occupational History  . Not on file  Tobacco Use  . Smoking status: Former Smoker    Packs/day: 0.02    Years: 25.00    Pack years: 0.50    Types: Cigarettes    Quit date: 12/26/2006    Years since quitting: 13.9  . Smokeless tobacco: Never Used  Vaping Use  . Vaping Use: Never used  Substance and Sexual Activity  . Alcohol use: Yes    Comment: beer twice per week, 2 at a time.   . Drug use: No  .  Sexual activity: Not on file  Other Topics Concern  . 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   Social Determinants of Health   Financial Resource Strain:   . Difficulty of Paying Living Expenses: Not on file  Food Insecurity:   . Worried About Charity fundraiser in the Last Year: Not on file  . Ran Out of Food in the Last Year: Not on file  Transportation Needs:   . Lack of Transportation (Medical): Not on file  . Lack of Transportation (Non-Medical): Not on file  Physical Activity:   . Days of Exercise per Week: Not on file  . Minutes of Exercise per Session: Not on file  Stress:   . Feeling of  Stress : Not on file  Social Connections:   . Frequency of Communication with Friends and Family: Not on file  . Frequency of Social Gatherings with Friends and Family: Not on file  . Attends Religious Services: Not on file  . Active Member of Clubs or Organizations: Not on file  . Attends Archivist Meetings: Not on file  . Marital Status: Not on file  Intimate Partner Violence:   . Fear of Current or Ex-Partner: Not on file  . Emotionally Abused: Not on file  . Physically Abused: Not on file  . Sexually Abused: Not on file    Review of Systems: See HPI, otherwise negative ROS  Physical Exam: BP (!) 154/101   Temp 98.6 F (37 C) (Oral)   Resp 18   Ht 5\' 7"  (1.702 m)   Wt 84.4 kg   SpO2 97%   BMI 29.13 kg/m  General:   Alert,  Well-developed, well-nourished, pleasant and cooperative in NAD Neck:  Supple; no masses or thyromegaly. No significant cervical adenopathy. Lungs:  Clear throughout to auscultation.   No wheezes, crackles, or rhonchi. No acute distress. Heart:  Regular rate and rhythm; no murmurs, clicks, rubs,  or gallops. Abdomen: Non-distended, normal bowel sounds.  Soft and nontender without appreciable mass or hepatosplenomegaly.  Pulses:  Normal pulses noted. Extremities:  Without clubbing or edema.  Impression/Plan: 64 year old gentleman longstanding GERD.  Here for further evaluation via EGD.  No dysphagia.  History of multiple colonic adenomas removed previously without surveillance.  Most recently, complicated sigmoid diverticulitis earlier this year with small abscess-clinically recovered.  He is here for surveillance colonoscopy as well.  Of offer the patient both an EGD and a colonoscopy today per plan. The risks, benefits, limitations, imponderables and alternatives regarding both EGD and colonoscopy have been reviewed with the patient. Questions have been answered. All parties agreeable.      Notice: This dictation was prepared with Dragon  dictation along with smaller phrase technology. Any transcriptional errors that result from this process are unintentional and may not be corrected upon review.

## 2020-11-26 ENCOUNTER — Other Ambulatory Visit: Payer: Self-pay

## 2020-11-26 LAB — SURGICAL PATHOLOGY

## 2020-11-29 ENCOUNTER — Encounter: Payer: Self-pay | Admitting: Internal Medicine

## 2020-11-30 ENCOUNTER — Encounter (HOSPITAL_COMMUNITY): Payer: Self-pay | Admitting: Internal Medicine

## 2020-12-21 ENCOUNTER — Other Ambulatory Visit (INDEPENDENT_AMBULATORY_CARE_PROVIDER_SITE_OTHER): Payer: Self-pay | Admitting: Internal Medicine

## 2021-01-21 ENCOUNTER — Telehealth (INDEPENDENT_AMBULATORY_CARE_PROVIDER_SITE_OTHER): Payer: Self-pay

## 2021-01-21 NOTE — Telephone Encounter (Signed)
Patients wife called and stated that the patient needs a refill of his testosterone syringes/needles sent to [facility]. Thank you.

## 2021-02-09 NOTE — Progress Notes (Signed)
Referring Provider: Doree Albee, MD Primary Care Physician:  Doree Albee, MD  Primary GI: Dr. Gala Romney   Chief Complaint  Patient presents with  . Follow-up    HPI:   Jeffrey Allen is a 65 y.o. male presenting today with a history of diverticulitis in Sept 2021 with abscess. Remote history of multiple polyps. Chronic GERD, taking Prilosec as needed. Underwent colonoscopy in interim from last visit noting  diverticulosis in sigmoid and descending colon, three 3-4 mm polyps at rectosigmoid colon and hepatic flexure. Hyperplastic polyp. 5 year colonoscopy. EGD also completed for Barrett's screening with  normal esophagus, possible mild portal gastropathy, normal duodenum. Mild chronic gastritis, negative H.pylori.   No obvious signs of cirrhosis Sept 2021 on CT imaging. Spleen normal. Denies abdominal pain, N/V. No constipation or diarrhea. No overt GI bleeding. Continues with Protonix daily. Has no GI concerns today.    Past Medical History:  Diagnosis Date  . GERD (gastroesophageal reflux disease)   . Gout   . Obesity (BMI 30.0-34.9)   . Peptic ulcer disease   . Prediabetes   . Stroke [facility])     Past Surgical History:  Procedure Laterality Date  . BIOPSY  11/25/2020   Procedure: BIOPSY;  Surgeon: Daneil Dolin, MD;  Location: [facility];  Service: Endoscopy;;  gastric  . COLONOSCOPY    . COLONOSCOPY N/A 12/07/2016   Scattered small and large-mouthed diverticula in sigmoid and descending colon. Six semi-pedunculated polyps in sigmoid and ascending colon, 4-6 mm in size. Distal TI normal appearing. Hyperplastic and tubular adenomas. 3 year surveillance recommended.   . COLONOSCOPY N/A 11/25/2020   Procedure: COLONOSCOPY;  Surgeon: Daneil Dolin, MD;  Location: [facility];  Service: Endoscopy;  Laterality: N/A;  10:30am  . ESOPHAGOGASTRODUODENOSCOPY N/A 11/25/2020   Procedure: ESOPHAGOGASTRODUODENOSCOPY (EGD);  Surgeon: Daneil Dolin, MD;  Location: [facility];  Service: Endoscopy;  Laterality: N/A;  . POLYPECTOMY  11/25/2020   Procedure: POLYPECTOMY;  Surgeon: Daneil Dolin, MD;  [facility]: [facility];  Service: Endoscopy;;    Current Outpatient Medications  Medication Sig Dispense Refill  . allopurinol (ZYLOPRIM) 300 MG tablet TAKE 1/2 TABLET BY MOUTH DAILY. 30 tablet 2  . aspirin EC 81 MG EC tablet Take 1 tablet (81 mg total) by mouth daily.    . Cholecalciferol (VITAMIN D3) 125 MCG (5000 UT) TABS Take 10,000 Units by mouth daily.     Marland Kitchen gabapentin (NEURONTIN) 100 MG capsule Take 1 capsule (100 mg total) by mouth 3 (three) times daily. (Patient taking differently: Take 100 mg by mouth at bedtime.) 90 capsule 3  . Garlic 5638 MG CAPS Take 1,000 mg by mouth daily.    Marland Kitchen omeprazole (PRILOSEC) 10 MG capsule Take 10 mg by mouth every 3 (three) days.     . Probiotic Product (PROBIOTIC-10 PO) Take 1 tablet by mouth daily. 18 thousand    . vitamin B-12 (CYANOCOBALAMIN) 1000 MCG tablet Take 1,000 mcg by mouth daily.    . Red Yeast Rice Extract (RED YEAST RICE PO) Take by mouth daily.  (Patient not taking: Reported on 02/10/2021)    . testosterone cypionate (DEPOTESTOSTERONE CYPIONATE) 200 MG/ML injection Inject 0.5 mLs (100 mg total) into the muscle once a week. (Patient not taking: Reported on 02/10/2021) 10 mL 2   No current facility-administered medications for this visit.    Allergies as of 02/10/2021 - Review Complete 02/10/2021  Allergen Reaction Noted  .  Prednisone Other (See Comments) 09/04/2015    Family History  Problem Relation Age of Onset  . Diabetes Father   . Colon cancer Neg Hx     Social History   Socioeconomic History  . Marital status: Married    Spouse name: Not on file  . Number of children: Not on file  . Years of education: Not on file  . Highest education level: Not on file  Occupational History  . Not on file  Tobacco Use  . Smoking status: Former Smoker    Packs/day: 0.02    Years: 25.00    Pack  years: 0.50    Types: Cigarettes    Quit date: 12/26/2006    Years since quitting: 14.1  . Smokeless tobacco: Never Used  Vaping Use  . Vaping Use: Never used  Substance and Sexual Activity  . Alcohol use: Yes    Comment: beer twice per week, 2 at a time.   . Drug use: No  . Sexual activity: Not on file  Other Topics Concern  . 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   Social Determinants of Health   Financial Resource Strain: Not on file  Food Insecurity: Not on file  Transportation Needs: Not on file  Physical Activity: Not on file  Stress: Not on file  Social Connections: Not on file    Review of Systems: Gen: Denies fever, chills, anorexia. Denies fatigue, weakness, weight loss.  CV: Denies chest pain, palpitations, syncope, peripheral edema, and claudication. Resp: Denies dyspnea at rest, cough, wheezing, coughing up blood, and pleurisy. GI: see HPI Derm: Denies rash, itching, dry skin Psych: Denies depression, anxiety, memory loss, confusion. No homicidal or suicidal ideation.  Heme: Denies bruising, bleeding, and enlarged lymph nodes.  Physical Exam: BP (!) 148/93   Pulse (!) 54   Temp 97.7 F (36.5 C) (Temporal)   Ht 5\' 7"  (1.702 m)   Wt 195 lb (88.5 kg)   BMI 30.54 kg/m  General:   Alert and oriented. No distress noted. Pleasant and cooperative.  Head:  Normocephalic and atraumatic. Eyes:  Conjuctiva clear without scleral icterus. Mouth:  Mask in place Abdomen:  +BS, soft, non-tender and non-distended. No rebound or guarding. No HSM or masses noted. Msk:  Symmetrical without gross deformities. Normal posture. Extremities:  Without edema. Neurologic:  Alert and  oriented x4 Psych:  Alert and cooperative. Normal mood and affect.  ASSESSMENT: Jeffrey Allen is a 65 y.o. male presenting today today with a history of diverticulitis in Sept 2021 with abscess, chronic GERD, returning for  follow-up.  Colonoscopy completed in interim with hyperplastic polyps. Due to remote history of adenomas, next surveillance will be in 5 years. EGD completed due to chronic GERD, with normal esophagus but did note possible mild portal gastropathy; however, path was consistent with mild chronic gastritis and negative H.pylori.CT imaging in Sept 2021 with normal spleen, no obvious cirrhosis, no thrombocytopenia.   Doing well on Protonix once daily. Will have him return in 1 year or sooner. Although possible portal gastropathy seen on EGD, he has no concerning signs for advanced liver disease on imaging or labs.   PLAN:  Continue Protonix once daily  Return in 1 year or sooner if needed  Annitta Needs, PhD, ANP-BC [facility]astroenter[facility]

## 2021-02-10 ENCOUNTER — Other Ambulatory Visit: Payer: Self-pay

## 2021-02-10 ENCOUNTER — Ambulatory Visit (INDEPENDENT_AMBULATORY_CARE_PROVIDER_SITE_OTHER): Payer: 59 | Admitting: Gastroenterology

## 2021-02-10 ENCOUNTER — Encounter: Payer: Self-pay | Admitting: Gastroenterology

## 2021-02-10 VITALS — BP 148/93 | HR 54 | Temp 97.7°F | Ht 67.0 in | Wt 195.0 lb

## 2021-02-10 DIAGNOSIS — K219 Gastro-esophageal reflux disease without esophagitis: Secondary | ICD-10-CM

## 2021-02-10 NOTE — Patient Instructions (Signed)
Continue Protonix once daily.  We will see you back in 1 year or sooner if needed!   I enjoyed seeing you again today! As you know, I value our relationship and want to provide genuine, compassionate, and quality care. I welcome your feedback. If you receive a survey regarding your visit,  I greatly appreciate you taking time to fill this out. See you next time!  Annitta Needs, PhD, ANP-BC [facility] Gastroenter[facility]

## 2021-02-15 NOTE — Progress Notes (Signed)
Cc'ed to pcp °

## 2021-02-16 ENCOUNTER — Encounter (INDEPENDENT_AMBULATORY_CARE_PROVIDER_SITE_OTHER): Payer: Self-pay | Admitting: Internal Medicine

## 2021-02-16 ENCOUNTER — Ambulatory Visit (INDEPENDENT_AMBULATORY_CARE_PROVIDER_SITE_OTHER): Payer: 59 | Admitting: Internal Medicine

## 2021-02-16 ENCOUNTER — Other Ambulatory Visit: Payer: Self-pay

## 2021-02-16 VITALS — BP 130/86 | HR 61 | Temp 96.9°F | Ht 67.0 in | Wt 194.8 lb

## 2021-02-16 DIAGNOSIS — M1A0790 Idiopathic chronic gout, unspecified ankle and foot, without tophus (tophi): Secondary | ICD-10-CM

## 2021-02-16 DIAGNOSIS — I10 Essential (primary) hypertension: Secondary | ICD-10-CM | POA: Diagnosis not present

## 2021-02-16 DIAGNOSIS — Z0001 Encounter for general adult medical examination with abnormal findings: Secondary | ICD-10-CM

## 2021-02-16 DIAGNOSIS — E291 Testicular hypofunction: Secondary | ICD-10-CM | POA: Diagnosis not present

## 2021-02-16 DIAGNOSIS — E559 Vitamin D deficiency, unspecified: Secondary | ICD-10-CM

## 2021-02-16 DIAGNOSIS — Z23 Encounter for immunization: Secondary | ICD-10-CM

## 2021-02-16 DIAGNOSIS — Z125 Encounter for screening for malignant neoplasm of prostate: Secondary | ICD-10-CM

## 2021-02-16 DIAGNOSIS — E782 Mixed hyperlipidemia: Secondary | ICD-10-CM

## 2021-02-16 DIAGNOSIS — R7303 Prediabetes: Secondary | ICD-10-CM

## 2021-02-16 NOTE — Progress Notes (Signed)
Chief Complaint: This 65 year old man comes in for an annual physical exam. HPI: He is doing well and has no specific complaints. He did have a history of prediabetes previously but he has improved his diet since that time.  He has a previous history of stroke and non-ST elevation MI. He continues on testosterone therapy without any problems. He has been diagnosed with carpal tunnel syndrome previously affecting both hands and this seems to be bothering him now more than it used to. He has a history of dyslipidemia which has not required statin therapy. He has a history of gout and takes allopurinol for preventative measures.  Past Medical History:  Diagnosis Date  . GERD (gastroesophageal reflux disease)   . Gout   . Obesity (BMI 30.0-34.9)   . Peptic ulcer disease   . Prediabetes   . Stroke [facility]) 06/2017   Past Surgical History:  Procedure Laterality Date  . BIOPSY  11/25/2020   Procedure: BIOPSY;  Surgeon: Daneil Dolin, MD;  [facility];  Service: Endoscopy;;  gastric  . COLONOSCOPY    . COLONOSCOPY N/A 12/07/2016   Scattered small and large-mouthed diverticula in sigmoid and descending colon. Six semi-pedunculated polyps in sigmoid and ascending colon, 4-6 mm in size. Distal TI normal appearing. Hyperplastic and tubular adenomas. 3 year surveillance recommended.   . COLONOSCOPY N/A 11/25/2020   diverticulosis in sigmoid and descending colon, three 3-4 mm polyps at rectosigmoid colon and hepatic flexure. Hyperplastic polyp. 5 year colonoscopy.   . ESOPHAGOGASTRODUODENOSCOPY N/A 11/25/2020     normal esophagus, portal gastropathy, normal duodenum. Mild chronic gastritis, negative H.pylori.   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    Social History   Tobacco Use   . Smoking status: Former Smoker    Packs/day: 0.02    Years: 25.00    Pack years: 0.50    Types: Cigarettes    Quit date: 12/26/2006    Years since quitting: 14.1  . Smokeless tobacco: Never Used  Substance Use Topics  . Alcohol use: Yes    Comment: beer twice per week, 2 at a time.       Allergies:  Allergies  Allergen Reactions  . Prednisone Other (See Comments)    Peptic ulcers       Current Meds  Medication Sig  . allopurinol (ZYLOPRIM) 300 MG tablet TAKE 1/2 TABLET BY MOUTH DAILY.  Marland Kitchen aspirin EC 81 MG EC tablet Take 1 tablet (81 mg total) by mouth daily.  . Cholecalciferol (VITAMIN D3) 125 MCG (5000 UT) TABS Take 10,000 Units by mouth daily.   Marland Kitchen gabapentin (NEURONTIN) 100 MG capsule Take 1 capsule (100 mg total) by mouth 3 (three) times daily. (Patient taking differently: Take 100 mg by mouth at bedtime.)  . Garlic 3329 MG CAPS Take 1,000 mg by mouth daily.  . pantoprazole (PROTONIX) 40 MG tablet Take 40 mg by mouth daily.  . Probiotic Product (PROBIOTIC-10 PO) Take 1 tablet by mouth daily. 18 thousand  . TUBERCULIN SYR 1CC/25GX5/8" 25G X 5/8" 1 ML MISC   . vitamin B-12 (CYANOCOBALAMIN) 1000 MCG tablet Take 1,000 mcg by mouth daily.     [place] Office Visit from 02/16/2021 in [place]  PHQ-9 Total Score 0  [id number] from the symptoms mentioned above,there are no other symptoms referable to all systems reviewed.       Physical Exam: Blood pressure 130/86, pulse 61, temperature (!) 96.9 F (36.1 C), temperature source Temporal, height 5\' 7"  (1.702 m), weight 194 lb 12.8 oz (88.4 kg), SpO2 96 %. Vitals with BMI 02/16/2021 02/10/2021 11/25/2020  Height 5\' 7"  5\' 7"  -  Weight 194 lbs 13 oz 195 lbs -  BMI 54.0 98.11 -  Systolic 914 782 956  Diastolic 86 93 77  Pulse 61 54 61      He looks systemically well, remains obese.  Blood pressure diastolic elevated.  Previously his readings have been acceptable. General: Alert, cooperative, and  appears to be the stated age.No pallor.  No jaundice.  No clubbing. Head: Normocephalic Eyes: Sclera white, pupils equal and reactive to light, red reflex x 2,  Ears: Normal bilaterally Oral cavity: Lips, mucosa, and tongue normal: Teeth and gums normal Neck: No adenopathy, supple, symmetrical, trachea midline, and thyroid does not appear enlarged Respiratory: Clear to auscultation bilaterally.No wheezing, crackles or bronchial breathing. Cardiovascular: Heart sounds are present and appear to be normal without murmurs or added sounds.  No carotid bruits.  Peripheral pulses are present and equal bilaterally.: Gastrointestinal:positive bowel sounds, no hepatosplenomegaly.  No masses felt.No tenderness. Skin: Clear, No rashes noted.No worrisome skin lesions seen. Neurological: Grossly intact without focal findings, cranial nerves II through XII intact, muscle strength equal bilaterally Musculoskeletal: No acute joint abnormalities noted.Full range of movement noted with joints.  He does have bilateral Dupuytren's contracture in his hands. Psychiatric: Affect appropriate, non-anxious.    Assessment  1. Encounter for general adult medical examination with abnormal findings   2. Testicular failure   3. Essential hypertension   4. Mixed hyperlipidemia   5. Vitamin D deficiency disease   6. 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 Prediabetes   8. Special screening for malignant neoplasm of prostate     Tests Ordered:   Orders Placed This Encounter  Procedures  . CBC  . COMPLETE METABOLIC PANEL WITH GFR  . Hemoglobin A1c  . Lipid panel  . PSA, Total with Reflex to PSA, Free  . T3, free  . T4, free  . TSH  . VITAMIN D 25 Hydroxy (Vit-D Deficiency, Fractures)  . Uric acid     Plan  1. 65 year old man with cerebrovascular disease and coronary artery disease. 2. Continue with testosterone therapy as before. 3. Blood work is  ordered. 4. Tdap booster given today. 5. Further recommendations will depend on blood results and I will see him in about 6 months time for follow-up.     No orders of the defined types were placed in this encounter.    Myan Locatelli C Wasim Hurlbut   02/16/2021, 9:27 AM

## 2021-02-16 NOTE — Addendum Note (Signed)
Addended by: Anibal Henderson on: 02/16/2021 09:39 AM   Modules accepted: Orders

## 2021-02-18 ENCOUNTER — Encounter (INDEPENDENT_AMBULATORY_CARE_PROVIDER_SITE_OTHER): Payer: Self-pay | Admitting: Internal Medicine

## 2021-02-18 LAB — LIPID PANEL
Cholesterol: 224 mg/dL — ABNORMAL HIGH (ref <200)
HDL: 40 mg/dL (ref >=40)
LDL Cholesterol (Calc): 162 mg/dL (calc) — ABNORMAL HIGH
Non-HDL Cholesterol (Calc): 184 mg/dL (calc) — ABNORMAL HIGH (ref <130)
Total CHOL/HDL Ratio: 5.6 (calc) — ABNORMAL HIGH (ref <5.0)
Triglycerides: 103 mg/dL (ref <150)

## 2021-02-18 LAB — VITAMIN D 25 HYDROXY (VIT D DEFICIENCY, FRACTURES): Vit D, 25-Hydroxy: 76 ng/mL (ref 30–100)

## 2021-02-18 LAB — HEMOGLOBIN A1C
Hgb A1c MFr Bld: 5.5 %{Hb}
Mean Plasma Glucose: 111 mg/dL
eAG (mmol/L): 6.2 mmol/L

## 2021-02-18 LAB — COMPLETE METABOLIC PANEL WITH GFR
AG Ratio: 1.6 (calc) (ref 1.0–2.5)
ALT: 18 U/L (ref 9–46)
AST: 17 U/L (ref 10–35)
Albumin: 4.3 g/dL (ref 3.6–5.1)
Alkaline phosphatase (APISO): 53 U/L (ref 35–144)
BUN: 19 mg/dL (ref 7–25)
CO2: 27 mmol/L (ref 20–32)
Calcium: 9.3 mg/dL (ref 8.6–10.3)
Chloride: 102 mmol/L (ref 98–110)
Creat: 1.07 mg/dL (ref 0.70–1.25)
GFR, Est African American: 85 mL/min/{1.73_m2} (ref >=60)
GFR, Est Non African American: 73 mL/min/{1.73_m2} (ref >=60)
Globulin: 2.7 g/dL (calc) (ref 1.9–3.7)
Glucose, Bld: 92 mg/dL (ref 65–99)
Potassium: 4.3 mmol/L (ref 3.5–5.3)
Sodium: 139 mmol/L (ref 135–146)
Total Bilirubin: 1.1 mg/dL (ref 0.2–1.2)
Total Protein: 7 g/dL (ref 6.1–8.1)

## 2021-02-18 LAB — CBC
HCT: 58.7 % — ABNORMAL HIGH (ref 38.5–50.0)
Hemoglobin: 20.3 g/dL — ABNORMAL HIGH (ref 13.2–17.1)
MCH: 31.5 pg (ref 27.0–33.0)
MCHC: 34.6 g/dL (ref 32.0–36.0)
MCV: 91 fL (ref 80.0–100.0)
MPV: 11.4 fL (ref 7.5–12.5)
Platelets: 188 10*3/uL (ref 140–400)
RBC: 6.45 10*6/uL — ABNORMAL HIGH (ref 4.20–5.80)
RDW: 12.8 % (ref 11.0–15.0)
WBC: 8.1 10*3/uL (ref 3.8–10.8)

## 2021-02-18 LAB — TSH: TSH: 1.46 m[IU]/L (ref 0.40–4.50)

## 2021-02-18 LAB — T3, FREE: T3, Free: 3.9 pg/mL (ref 2.3–4.2)

## 2021-02-18 LAB — URIC ACID: Uric Acid, Serum: 6.5 mg/dL (ref 4.0–8.0)

## 2021-02-18 LAB — T4, FREE: Free T4: 1.2 ng/dL (ref 0.8–1.8)

## 2021-02-18 LAB — PSA, TOTAL WITH REFLEX TO PSA, FREE: PSA, Total: 0.4 ng/mL

## 2021-03-26 ENCOUNTER — Other Ambulatory Visit (INDEPENDENT_AMBULATORY_CARE_PROVIDER_SITE_OTHER): Payer: Self-pay | Admitting: Internal Medicine

## 2021-05-05 ENCOUNTER — Other Ambulatory Visit (INDEPENDENT_AMBULATORY_CARE_PROVIDER_SITE_OTHER): Payer: Self-pay | Admitting: Internal Medicine

## 2021-05-17 ENCOUNTER — Other Ambulatory Visit (INDEPENDENT_AMBULATORY_CARE_PROVIDER_SITE_OTHER): Payer: Self-pay | Admitting: Internal Medicine

## 2021-07-05 ENCOUNTER — Ambulatory Visit
Admission: EM | Admit: 2021-07-05 | Discharge: 2021-07-05 | Disposition: A | Discharge status: Home / Self Care | Payer: 59 | Attending: Family Medicine | Admitting: Family Medicine

## 2021-07-05 ENCOUNTER — Encounter: Payer: Self-pay | Admitting: Emergency Medicine

## 2021-07-05 DIAGNOSIS — J0100 Acute maxillary sinusitis, unspecified: Secondary | ICD-10-CM | POA: Diagnosis not present

## 2021-07-05 DIAGNOSIS — J209 Acute bronchitis, unspecified: Secondary | ICD-10-CM

## 2021-07-05 MED ORDER — PROMETHAZINE-DM 6.25-15 MG/5ML PO SYRP: 5.0000 mL | ORAL_SOLUTION | Freq: Four times a day (QID) | ORAL | 0 refills | Status: DC | PRN

## 2021-07-05 MED ORDER — AMOXICILLIN-POT CLAVULANATE 875-125 MG PO TABS: 1.0000 | ORAL_TABLET | Freq: Two times a day (BID) | ORAL | 0 refills | Status: DC

## 2021-07-05 MED ORDER — LEVOCETIRIZINE DIHYDROCHLORIDE 5 MG PO TABS: 5.0000 mg | ORAL_TABLET | Freq: Every evening | ORAL | 0 refills | Status: DC

## 2021-07-05 NOTE — ED Triage Notes (Signed)
Nasal drainage and cough with head pressure, coughing up yellow sputum since Wednesday.

## 2021-07-05 NOTE — ED Provider Notes (Signed)
[facility]    CSN: [id number] Arrival date & time: 07/05/21  0856      History   Chief Complaint No chief complaint on file.   HPI Jeffrey Allen is a 65 y.o. male.   HPI Patient presents with URI symptoms including cough occasionally productive, HA, nasal congestion, runny nose, and sinus pressure x 5 days. Wife had COVID x 1 month ago. Denies wheezing but endorses mildSoB He had COVID negative test . Taking OTC medication without relief of symptoms.   Past Medical History:  Diagnosis Date   GERD (gastroesophageal reflux disease)    Gout    Obesity (BMI 30.0-34.9)    Peptic ulcer disease    Prediabetes    Stroke ([place]) 06/2017    Patient Active Problem List   Diagnosis Date Noted   Diverticulitis of colon 10/07/2020   History of colonic polyps 10/07/2020   Vitamin D deficiency 02/10/2020   Chest pain 12/30/2019   COVID-19 virus infection 12/30/2019   NSTEMI (non-ST elevated myocardial infarction) ([place]) 12/29/2019   Prediabetes 11/07/2019   Testicular failure 11/07/2019   Obesity (BMI 30.0-34.9) 11/07/2019   Stroke ([place])    Essential hypertension 06/19/2018   Hyperlipidemia 06/19/2018   Gout 06/18/2018   GERD (gastroesophageal reflux disease) 06/18/2018   Acute CVA (cerebrovascular accident) ([place]) 06/18/2018   CARPAL TUNNEL SYNDROME 04/06/2009   CUBITAL TUNNEL SYNDROME 04/06/2009   NECK PAIN, CHRONIC 04/06/2009   Disturbance of skin sensation 04/06/2009   ANKLE SPRAIN 12/03/2008    Past Surgical History:  Procedure Laterality Date   BIOPSY  11/25/2020   Procedure: BIOPSY;  Surgeon: Daneil Dolin, MD;  [facility];  Service: Endoscopy;;  gastric   COLONOSCOPY     COLONOSCOPY N/A 12/07/2016   Scattered small and large-mouthed diverticula in sigmoid and descending colon. Six semi-pedunculated polyps in sigmoid and ascending colon, 4-6 mm in size. Distal TI normal appearing. Hyperplastic and tubular adenomas. 3 year surveillance  recommended.    COLONOSCOPY N/A 11/25/2020   diverticulosis in sigmoid and descending colon, three 3-4 mm polyps at rectosigmoid colon and hepatic flexure. Hyperplastic polyp. 5 year colonoscopy.    ESOPHAGOGASTRODUODENOSCOPY N/A 11/25/2020     normal esophagus, portal gastropathy, normal duodenum. Mild chronic gastritis, negative H.pylori.    POLYPECTOMY  11/25/2020   Procedure: POLYPECTOMY;  Surgeon: Daneil Dolin, MD;  [facility];  Service: Endoscopy;;       Home Medications    Prior to Admission medications   Medication Sig Start Date End Date Taking? Authorizing Provider  amoxicillin-clavulanate (AUGMENTIN) 875-125 MG tablet Take 1 tablet by mouth 2 (two) times daily. 07/05/21  Yes Scot Jun, FNP  gabapentin (NEURONTIN) 100 MG capsule TAKE 1 CAPSULE BY MOUTH THREE TIMES A DAY. 03/29/21   Hurshel Party C, MD  levocetirizine (XYZAL) 5 MG tablet Take 1 tablet (5 mg total) by mouth every evening for 14 days. 07/05/21 07/19/21 Yes Scot Jun, FNP  promethazine-dextromethorphan (PROMETHAZINE-DM) 6.25-15 MG/5ML syrup Take 5 mLs by mouth 4 (four) times daily as needed for cough. 07/05/21  Yes Scot Jun, FNP  allopurinol (ZYLOPRIM) 300 MG tablet Take 0.5 tablets (150 mg total) by mouth daily. 05/18/21   Doree Albee, MD  aspirin EC 81 MG EC tablet Take 1 tablet (81 mg total) by mouth daily. 01/01/20   Orson Eva, MD  Cholecalciferol (VITAMIN D3) 125 MCG (5000 UT) TABS Take 10,000 Units by mouth daily.     [provider]  Garlic 2671 MG CAPS Take 1,000 mg by mouth daily.    [provider]  pantoprazole (PROTONIX) 40 MG tablet Take 40 mg by mouth daily. 02/09/21   [provider]  Probiotic Product (PROBIOTIC-10 PO) Take 1 tablet by mouth daily. 18 thousand    [provider]  testosterone cypionate (DEPOTESTOSTERONE CYPIONATE) 200 MG/ML injection INJECT (0.5)ML ONCE WEEKLY AS DIRECTED. 05/05/21   Doree Albee, MD   TUBERCULIN SYR 1CC/25GX5/8" 25G X 5/8" 1 ML MISC  01/21/21   [provider]  vitamin B-12 (CYANOCOBALAMIN) 1000 MCG tablet Take 1,000 mcg by mouth daily.    [provider]    Family History Family History  Problem Relation Age of Onset   Diabetes Father    COPD Father    Dementia Mother    Colon cancer Neg Hx     Social History Social History   Tobacco Use   Smoking status: Former    Packs/day: 0.02    Years: 25.00    Pack years: 0.50    Types: Cigarettes    Quit date: 12/26/2006    Years since quitting: 14.5   Smokeless tobacco: Never  Vaping Use   Vaping Use: Never used  Substance Use Topics   Alcohol use: Yes    Comment: beer twice per week, 2 at a time.    Drug use: No     Allergies   Prednisone   Review of Systems Review of Systems   Physical Exam Triage Vital Signs ED Triage Vitals  Enc Vitals Group     BP 07/05/21 1058 (!) 169/95     Pulse Rate 07/05/21 1058 (!) 53     Resp 07/05/21 1058 16     Temp 07/05/21 1058 98.6 F (37 C)     Temp Source 07/05/21 1058 Oral     SpO2 07/05/21 1058 94 %     Weight --      Height --      Head Circumference --      Peak Flow --      Pain Score 07/05/21 1100 0     Pain Loc --      Pain Edu? --      Excl. in [place]? --    No data found.  Updated Vital Signs BP (!) 169/95 (BP Location: Right Arm)   Pulse (!) 53   Temp 98.6 F (37 C) (Oral)   Resp 16   SpO2 94%   Visual Acuity Right Eye Distance:   Left Eye Distance:   Bilateral Distance:    Right Eye Near:   Left Eye Near:    Bilateral Near:     Physical Exam General appearance: alert, Ill-appearing, no distress Head: Normocephalic, without obvious abnormality, atraumatic ENT: Ears normal, nares w/mucosal edema, congestion, erythematous oropharynx w/o exudate Respiratory: Respirations even , unlabored, coarse lung sound,no wheezing or rales Heart: rate and rhythm normal. No gallop or murmurs noted on exam  Abdomen: BS +, no  distention, no rebound tenderness, or no mass Extremities: No gross deformities Skin: Skin color, texture, turgor normal. No rashes seen  Psych: Appropriate mood and affect. Neurologic: GCS 15, normal coordination, normal gait  UC Treatments / Results  Labs (all labs ordered are listed, but only abnormal results are displayed) Labs Reviewed - No data to display  EKG   Radiology No results found.  Procedures Procedures (including critical care time)  Medications Ordered in UC Medications - No data to  display  Initial Impression / Assessment and Plan / UC Course  I have reviewed the triage vital signs and the nursing notes.  Pertinent labs & imaging results that were available during my care of the patient were reviewed by me and considered in my medical decision making (see chart for details).    Treating for acute sinusitis and acute bronchitis  Treatment with Augmentin, promethazine DM and  Levocetirizine. Follow-up with [facility] as needed or return for evaluation. ER precautions if symptoms worsen or do not improve. Final Clinical Impressions(s) / UC Diagnoses   Final diagnoses:  Acute non-recurrent maxillary sinusitis  Acute bronchitis, unspecified organism   Discharge Instructions   None    ED Prescriptions     Medication Sig Dispense Auth. Provider   amoxicillin-clavulanate (AUGMENTIN) 875-125 MG tablet Take 1 tablet by mouth 2 (two) times daily. 20 tablet Scot Jun, FNP   promethazine-dextromethorphan (PROMETHAZINE-DM) 6.25-15 MG/5ML syrup Take 5 mLs by mouth 4 (four) times daily as needed for cough. 180 mL Scot Jun, FNP   levocetirizine (XYZAL) 5 MG tablet Take 1 tablet (5 mg total) by mouth every evening for 14 days. 14 tablet Scot Jun, FNP      PDMP not reviewed this encounter.   Scot Jun, FNP 07/09/21 1554

## 2021-07-07 ENCOUNTER — Telehealth (INDEPENDENT_AMBULATORY_CARE_PROVIDER_SITE_OTHER): Payer: Self-pay

## 2021-07-07 NOTE — Telephone Encounter (Signed)
We need to see the patient to further assess.  I am fully booked today and tomorrow so see if you can get him an appointment next week.  If his breathing gets worse, he definitely needs to go to the emergency room.

## 2021-07-07 NOTE — Telephone Encounter (Signed)
Pt called on Tuesday voicemail for Dr Anastasio Champion to call back. I called him today, wife answered phone. Explained his issues & concerns. Stated Raynell stated having some Rt leg pain , hurting so bad . But better today. Went to Urgent care to get seen & Rx for sinuitis.  But Wife he wanted you to know and to think it may be gout. Fri,sat sun, Tues and today. But drinks not like he was.  Just got over Covid over the month. So she thinks it may have did some damage. Sounds like he was having heat stoke. But she got him in house. He said "I felt winded". Wife asked do you think if a inhaler will help him in the day when he is feeling short of breath or does he need something else.   Please advise.

## 2021-07-13 ENCOUNTER — Other Ambulatory Visit: Payer: Self-pay | Admitting: Internal Medicine

## 2021-07-14 ENCOUNTER — Ambulatory Visit (INDEPENDENT_AMBULATORY_CARE_PROVIDER_SITE_OTHER): Payer: 59 | Admitting: Internal Medicine

## 2021-07-14 ENCOUNTER — Encounter (INDEPENDENT_AMBULATORY_CARE_PROVIDER_SITE_OTHER): Payer: Self-pay | Admitting: Internal Medicine

## 2021-07-14 ENCOUNTER — Other Ambulatory Visit: Payer: Self-pay

## 2021-07-14 VITALS — BP 140/74 | HR 88 | Temp 97.3°F | Ht 67.0 in | Wt 191.6 lb

## 2021-07-14 DIAGNOSIS — K219 Gastro-esophageal reflux disease without esophagitis: Secondary | ICD-10-CM | POA: Diagnosis not present

## 2021-07-14 DIAGNOSIS — J40 Bronchitis, not specified as acute or chronic: Secondary | ICD-10-CM

## 2021-07-14 MED ORDER — PANTOPRAZOLE SODIUM 40 MG PO TBEC: 40.0000 mg | DELAYED_RELEASE_TABLET | Freq: Every day | ORAL | 1 refills | Status: AC

## 2021-07-14 NOTE — Progress Notes (Signed)
Metrics: Intervention Frequency ACO  Documented Smoking Status Yearly  Screened one or more times in 24 months  Cessation Counseling or  Active cessation medication Past 24 months  Past 24 months   Guideline developer: UpToDate (See UpToDate for funding source) Date Released: 2014       Wellness Office Visit  Subjective:  Patient ID: Jeffrey Allen, male    DOB: 1956-05-17  Age: 65 y.o. MRN: [id number]  CC: This man comes in for follow-up regarding his urgent care visit. HPI  He presented with symptoms of bronchitis and he was treated with antibiotics and antihistamines and has improved significantly.  He went earlier this week.  He apparently had wheezing at that time. Past Medical History:  Diagnosis Date   GERD (gastroesophageal reflux disease)    Gout    Obesity (BMI 30.0-34.9)    Peptic ulcer disease    Prediabetes    Stroke [facility]) 06/2017   Past Surgical History:  Procedure Laterality Date   BIOPSY  11/25/2020   Procedure: BIOPSY;  Surgeon: Daneil Dolin, MD;  [facility];  Service: Endoscopy;;  gastric   COLONOSCOPY     COLONOSCOPY N/A 12/07/2016   Scattered small and large-mouthed diverticula in sigmoid and descending colon. Six semi-pedunculated polyps in sigmoid and ascending colon, 4-6 mm in size. Distal TI normal appearing. Hyperplastic and tubular adenomas. 3 year surveillance recommended.    COLONOSCOPY N/A 11/25/2020   diverticulosis in sigmoid and descending colon, three 3-4 mm polyps at rectosigmoid colon and hepatic flexure. Hyperplastic polyp. 5 year colonoscopy.    ESOPHAGOGASTRODUODENOSCOPY N/A 11/25/2020     normal esophagus, portal gastropathy, normal duodenum. Mild chronic gastritis, negative H.pylori.    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   Social History   Tobacco Use   Smoking status: Former    Packs/day: 0.02    Years: 25.00    Pack years: 0.50    Types: Cigarettes    Quit date: 12/26/2006    Years since quitting: 14.5   Smokeless tobacco: Never  Substance Use Topics   Alcohol use: Yes    Comment: beer twice per week, 2 at a time.     Current Meds  Medication Sig   allopurinol (ZYLOPRIM) 300 MG tablet Take 0.5 tablets (150 mg total) by mouth daily.   amoxicillin-clavulanate (AUGMENTIN) 875-125 MG tablet Take 1 tablet by mouth 2 (two) times daily.   Cholecalciferol (VITAMIN D3) 125 MCG (5000 UT) TABS Take 10,000 Units by mouth daily.    gabapentin (NEURONTIN) 100 MG capsule TAKE 1 CAPSULE BY MOUTH THREE TIMES A DAY.   Garlic 5035 MG CAPS Take 1,000 mg by mouth daily.   levocetirizine (XYZAL) 5 MG tablet Take 1 tablet (5 mg total) by mouth every evening for 14 days.   Probiotic Product (PROBIOTIC-10 PO) Take 1 tablet by mouth daily. 18 thousand   promethazine-dextromethorphan (PROMETHAZINE-DM) 6.25-15 MG/5ML syrup Take 5 mLs by mouth 4 (four) times daily as needed for cough.   testosterone cypionate (DEPOTESTOSTERONE CYPIONATE) 200 MG/ML injection INJECT (0.5)ML ONCE WEEKLY AS DIRECTED.  TUBERCULIN SYR 1CC/25GX5/8" 25G X 5/8" 1 ML MISC    vitamin B-12 (CYANOCOBALAMIN) 1000 MCG tablet Take 1,000 mcg by mouth daily.   [DISCONTINUED] pantoprazole (PROTONIX) 40 MG tablet TAKE 1 TABLET BY MOUTH DAILY     Flowsheet Row Office Visit from 02/16/2021 in [facility]  PHQ-9 Total Score 0       Objective:   Today's Vitals: BP 140/74 (BP Location: Left Arm, Patient Position: Sitting, Cuff Size: Large)   Pulse 88   Temp (!) 97.3 F (36.3 C) (Temporal)   Ht 5\' 7"  (1.702 m)   Wt 191 lb 9.6 oz (86.9 kg)   SpO2 98%   BMI 30.01 kg/m  Vitals with BMI 07/14/2021 07/05/2021  02/16/2021  Height 5\' 7"  - 5\' 7"   Weight 191 lbs 10 oz - 194 lbs 13 oz  BMI 30 - 18.9  Systolic 842 103 128  Diastolic 74 95 86  Pulse 88 53 61     Physical Exam  He looks systemically well.  There is no respiratory distress.  Oxygen saturation is excellent on room air.  Lung fields are entirely clear with no evidence of wheezing.     Assessment   1. Bronchitis   2. Gastroesophageal reflux disease without esophagitis       Tests ordered No orders of the defined types were placed in this encounter.    Plan: 1.  Bronchitis is resolved. 2.  He did want refill of Protonix for his gastroesophageal reflux disease and we will remember to take it every day. 3.  Follow-up as previously scheduled    Meds ordered this encounter  Medications   pantoprazole (PROTONIX) 40 MG tablet    Sig: Take 1 tablet (40 mg total) by mouth daily.    Dispense:  90 tablet    Refill:  1     Zaiya Annunziato Luther Parody, MD

## 2021-07-29 ENCOUNTER — Encounter (INDEPENDENT_AMBULATORY_CARE_PROVIDER_SITE_OTHER): Payer: Self-pay

## 2021-08-12 ENCOUNTER — Ambulatory Visit (INDEPENDENT_AMBULATORY_CARE_PROVIDER_SITE_OTHER): Payer: 59 | Admitting: Internal Medicine

## 2021-09-22 ENCOUNTER — Other Ambulatory Visit: Payer: Self-pay | Admitting: Internal Medicine

## 2021-10-01 ENCOUNTER — Ambulatory Visit: Payer: 59 | Admitting: Internal Medicine

## 2021-10-21 ENCOUNTER — Other Ambulatory Visit: Payer: Self-pay | Admitting: Internal Medicine

## 2021-10-21 ENCOUNTER — Ambulatory Visit
Admission: EM | Admit: 2021-10-21 | Discharge: 2021-10-21 | Disposition: A | Discharge status: Home / Self Care | Payer: 59 | Attending: Physician Assistant | Admitting: Physician Assistant

## 2021-10-21 ENCOUNTER — Other Ambulatory Visit: Payer: Self-pay

## 2021-10-21 ENCOUNTER — Encounter: Payer: Self-pay | Admitting: Emergency Medicine

## 2021-10-21 DIAGNOSIS — J0120 Acute ethmoidal sinusitis, unspecified: Secondary | ICD-10-CM

## 2021-10-21 MED ORDER — AMOXICILLIN-POT CLAVULANATE 875-125 MG PO TABS: 1.0000 | ORAL_TABLET | Freq: Two times a day (BID) | ORAL | 0 refills | Status: DC

## 2021-10-21 NOTE — ED Triage Notes (Signed)
Patient c/o sinus pressure and nonproductive cough x 3 days.  Patient denies fever.   Patient endorses fatigue. Patient endorses worsening nasal congestion.   Patient endorses chest pain while coughing.   Patient has taken some "cough medicine" with no relief of symptoms.

## 2021-10-21 NOTE — ED Provider Notes (Signed)
[facility]    CSN: [id number] Arrival date & time: 10/21/21  6283      History   Chief Complaint Chief Complaint  Patient presents with   Facial Pain   Cough    HPI Jeffrey Allen is a 65 y.o. male.   Pt complains of sinus congestiona nd cough  The history is provided by the patient. No language interpreter was used.  Cough Cough characteristics:  Non-productive Sputum characteristics:  Nondescript Severity:  Moderate Onset quality:  Gradual Timing:  Constant Progression:  Worsening Chronicity:  New Smoker: no   Context: upper respiratory infection   Relieved by:  Nothing Worsened by:  Nothing Associated symptoms: headaches, sinus congestion and sore throat    Past Medical History:  Diagnosis Date   GERD (gastroesophageal reflux disease)    Gout    Obesity (BMI 30.0-34.9)    Peptic ulcer disease    Prediabetes    Stroke ([place]) 06/2017    Patient Active Problem List   Diagnosis Date Noted   Diverticulitis of colon 10/07/2020   History of colonic polyps 10/07/2020   Vitamin D deficiency 02/10/2020   Chest pain 12/30/2019   COVID-19 virus infection 12/30/2019   NSTEMI (non-ST elevated myocardial infarction) ([place]) 12/29/2019   Prediabetes 11/07/2019   Testicular failure 11/07/2019   Obesity (BMI 30.0-34.9) 11/07/2019   Stroke ([place])    Essential hypertension 06/19/2018   Hyperlipidemia 06/19/2018   Gout 06/18/2018   GERD (gastroesophageal reflux disease) 06/18/2018   Acute CVA (cerebrovascular accident) ([place]) 06/18/2018   CARPAL TUNNEL SYNDROME 04/06/2009   CUBITAL TUNNEL SYNDROME 04/06/2009   NECK PAIN, CHRONIC 04/06/2009   Disturbance of skin sensation 04/06/2009   ANKLE SPRAIN 12/03/2008    Past Surgical History:  Procedure Laterality Date   BIOPSY  11/25/2020   Procedure: BIOPSY;  Surgeon: Daneil Dolin, MD;  [facility];  Service: Endoscopy;;  gastric   COLONOSCOPY     COLONOSCOPY N/A 12/07/2016   Scattered small  and large-mouthed diverticula in sigmoid and descending colon. Six semi-pedunculated polyps in sigmoid and ascending colon, 4-6 mm in size. Distal TI normal appearing. Hyperplastic and tubular adenomas. 3 year surveillance recommended.    COLONOSCOPY N/A 11/25/2020   diverticulosis in sigmoid and descending colon, three 3-4 mm polyps at rectosigmoid colon and hepatic flexure. Hyperplastic polyp. 5 year colonoscopy.    ESOPHAGOGASTRODUODENOSCOPY N/A 11/25/2020     normal esophagus, portal gastropathy, normal duodenum. Mild chronic gastritis, negative H.pylori.    POLYPECTOMY  11/25/2020   Procedure: POLYPECTOMY;  Surgeon: Daneil Dolin, MD;  [facility];  Service: Endoscopy;;       Home Medications    Prior to Admission medications   Medication Sig Start Date End Date Taking? Authorizing Provider  allopurinol (ZYLOPRIM) 300 MG tablet Take 0.5 tablets (150 mg total) by mouth daily. 05/18/21  Yes Gosrani, Nimish C, MD  gabapentin (NEURONTIN) 100 MG capsule TAKE 1 CAPSULE BY MOUTH THREE TIMES A DAY. 09/22/21  Yes Lindell Spar, MD  amoxicillin-clavulanate (AUGMENTIN) 875-125 MG tablet Take 1 tablet by mouth 2 (two) times daily. 10/21/21   Fransico Meadow, PA-C  aspirin EC 81 MG EC tablet Take 1 tablet (81 mg total) by mouth daily. Patient not taking: No sig reported 01/01/20   Tat, Shanon Brow, MD  Cholecalciferol (VITAMIN D3) 125 MCG (5000 UT) TABS Take 10,000 Units by mouth daily.     [provider]  Garlic 1517 MG CAPS Take 1,000 mg  by mouth daily.    [provider]  levocetirizine (XYZAL) 5 MG tablet Take 1 tablet (5 mg total) by mouth every evening for 14 days. 07/05/21 07/19/21  Scot Jun, FNP  pantoprazole (PROTONIX) 40 MG tablet Take 1 tablet (40 mg total) by mouth daily. 07/14/21   Doree Albee, MD  Probiotic Product (PROBIOTIC-10 PO) Take 1 tablet by mouth daily. 18 thousand    [provider]  promethazine-dextromethorphan (PROMETHAZINE-DM)  6.25-15 MG/5ML syrup Take 5 mLs by mouth 4 (four) times daily as needed for cough. 07/05/21   Scot Jun, FNP  testosterone cypionate (DEPOTESTOSTERONE CYPIONATE) 200 MG/ML injection INJECT (0.5)ML ONCE WEEKLY AS DIRECTED. 05/05/21   Doree Albee, MD  TUBERCULIN SYR 1CC/25GX5/8" 25G X 5/8" 1 ML MISC  01/21/21   [provider]  vitamin B-12 (CYANOCOBALAMIN) 1000 MCG tablet Take 1,000 mcg by mouth daily.    [provider]    Family History Family History  Problem Relation Age of Onset   Diabetes Father    COPD Father    Dementia Mother    Colon cancer Neg Hx     Social History Social History   Tobacco Use   Smoking status: Former    Packs/day: 0.02    Years: 25.00    Pack years: 0.50    Types: Cigarettes    Quit date: 12/26/2006    Years since quitting: 14.8   Smokeless tobacco: Never  Vaping Use   Vaping Use: Never used  Substance Use Topics   Alcohol use: Yes    Comment: beer twice per week, 2 at a time.    Drug use: No     Allergies   Prednisone   Review of Systems Review of Systems  HENT:  Positive for sore throat.   Respiratory:  Positive for cough.   Neurological:  Positive for headaches.  All other systems reviewed and are negative.   Physical Exam Triage Vital Signs ED Triage Vitals [10/21/21 1146]  Enc Vitals Group     BP 135/80     Pulse Rate (!) 55     Resp 16     Temp 98.2 F (36.8 C)     Temp Source Oral     SpO2 94 %     Weight      Height      Head Circumference      Peak Flow      Pain Score 5     Pain Loc      Pain Edu?      Excl. in [place]?    No data found.  Updated Vital Signs BP 135/80 (BP Location: Right Arm)   Pulse (!) 55   Temp 98.2 F (36.8 C) (Oral)   Resp 16   SpO2 94%   Visual Acuity Right Eye Distance:   Left Eye Distance:   Bilateral Distance:    Right Eye Near:   Left Eye Near:    Bilateral Near:     Physical Exam Vitals and nursing note reviewed.  Constitutional:       Appearance: He is well-developed.  HENT:     Head: Normocephalic and atraumatic.  Eyes:     Conjunctiva/sclera: Conjunctivae normal.  Cardiovascular:     Rate and Rhythm: Normal rate and regular rhythm.     Heart sounds: No murmur heard. Pulmonary:     Effort: Pulmonary effort is normal. No respiratory distress.     Breath sounds: Normal breath sounds.  Abdominal:     Palpations: Abdomen is soft.     Tenderness: There is no abdominal tenderness.  Musculoskeletal:        General: Normal range of motion.     Cervical back: Neck supple.  Skin:    General: Skin is warm and dry.  Neurological:     Mental Status: He is alert.     UC Treatments / Results  Labs (all labs ordered are listed, but only abnormal results are displayed) Labs Reviewed  COVID-19, FLU A+B NAA    EKG   Radiology No results found.  Procedures Procedures (including critical care time)  Medications Ordered in UC Medications - No data to display  Initial Impression / Assessment and Plan / UC Course  I have reviewed the triage vital signs and the nursing notes.  Pertinent labs & imaging results that were available during my care of the patient were reviewed by me and considered in my medical decision making (see chart for details).     MDM:  Flu and covid ordered.  Pt given rx for augmentin Final Clinical Impressions(s) / UC Diagnoses   Final diagnoses:  Acute ethmoidal sinusitis, recurrence not specified     Discharge Instructions      YOur covid and flu test are pending   ED Prescriptions     Medication Sig Dispense Auth. Provider   amoxicillin-clavulanate (AUGMENTIN) 875-125 MG tablet Take 1 tablet by mouth 2 (two) times daily. 20 tablet Fransico Meadow, [place]      PDMP not reviewed this encounter. An After Visit Summary was printed and given to the patient.    Fransico Meadow, [place] 10/21/21 1237

## 2021-10-21 NOTE — Discharge Instructions (Signed)
YOur covid and flu test are pending

## 2021-10-22 LAB — COVID-19, FLU A+B NAA
Influenza A, NAA: NOT DETECTED
Influenza B, NAA: NOT DETECTED
SARS-CoV-2, NAA: NOT DETECTED

## 2021-11-26 IMAGING — DX DG CHEST 1V PORT
1 series · 1 of 1 positions shown · non-contrast
Comparison: None.

CLINICAL DATA: Chest pain

EXAM:
PORTABLE CHEST 1 VIEW

[chest ap]
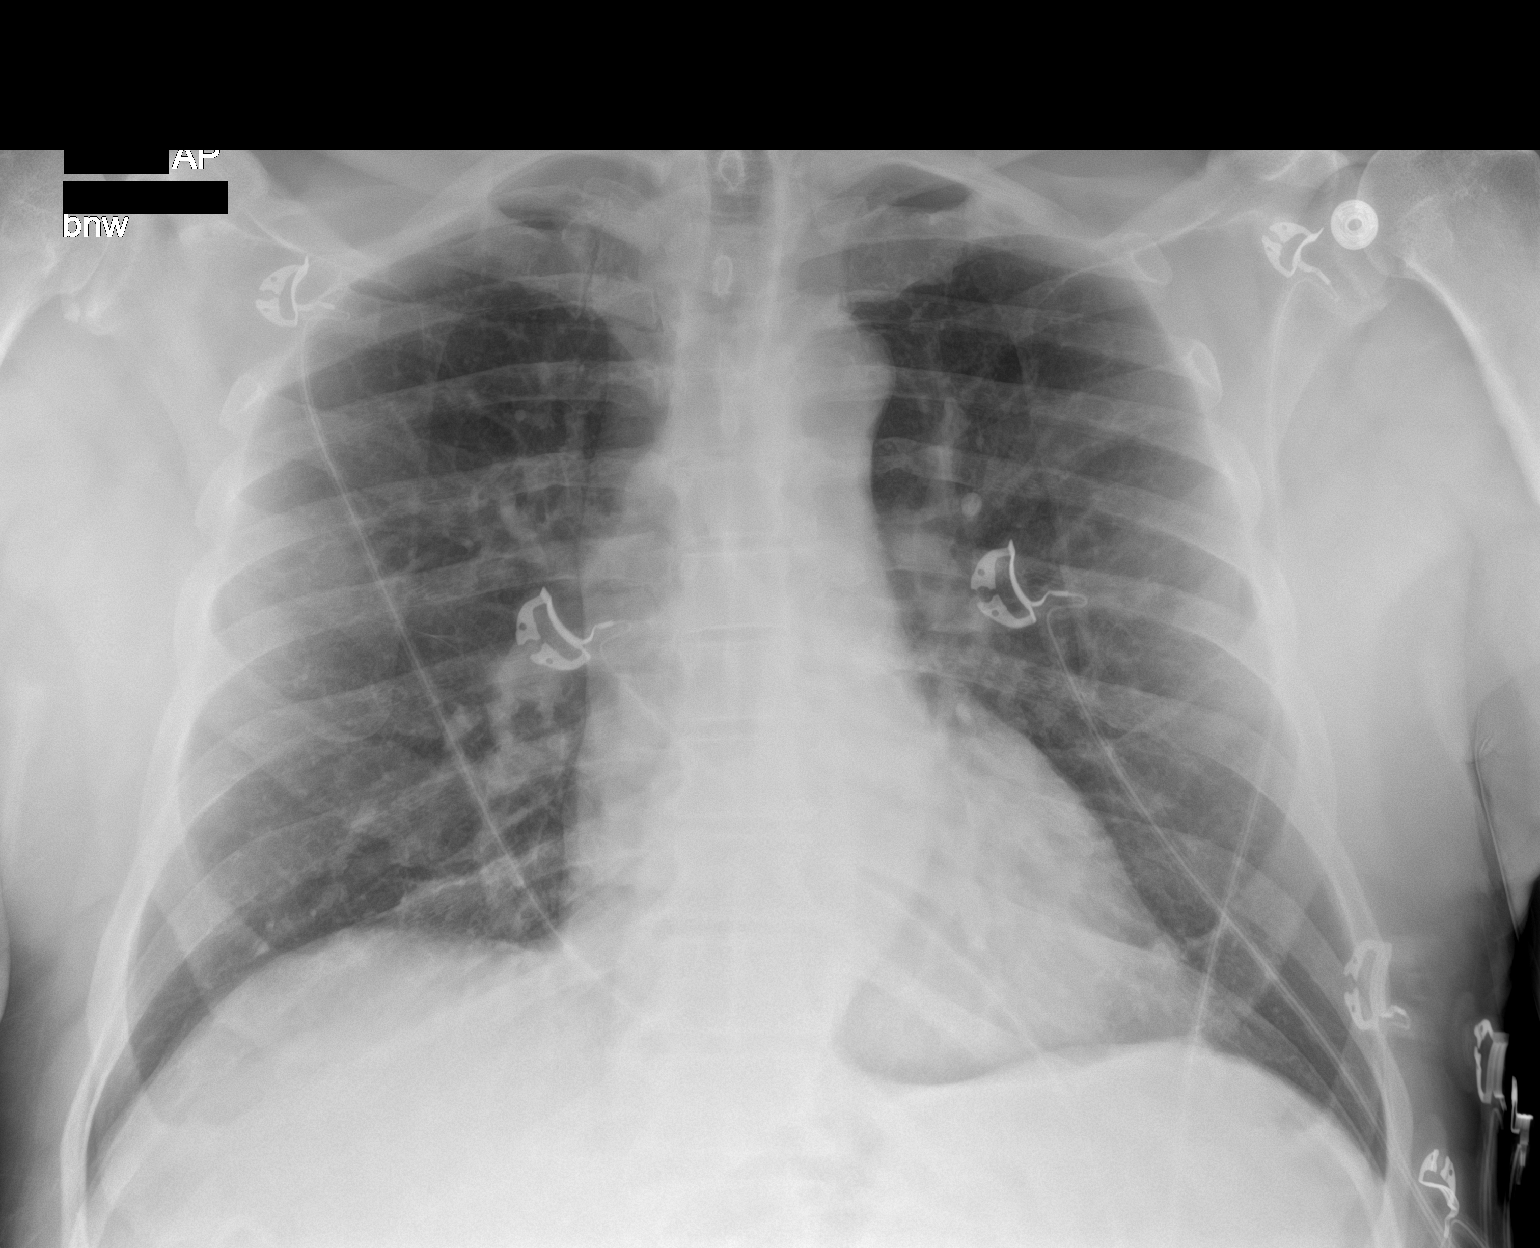

[1 of 1 positions shown; findings below may reference images not displayed]

FINDINGS: The heart size and mediastinal contours are within normal limits.
Both lungs are clear. The visualized skeletal structures are
unremarkable.
IMPRESSION: No active disease.

## 2021-12-25 ENCOUNTER — Other Ambulatory Visit: Payer: Self-pay | Admitting: Internal Medicine

## 2022-01-24 DIAGNOSIS — D582 Other hemoglobinopathies: Secondary | ICD-10-CM | POA: Diagnosis not present

## 2022-01-26 DIAGNOSIS — Z8673 Personal history of transient ischemic attack (TIA), and cerebral infarction without residual deficits: Secondary | ICD-10-CM | POA: Diagnosis not present

## 2022-01-26 DIAGNOSIS — Z6829 Body mass index (BMI) 29.0-29.9, adult: Secondary | ICD-10-CM | POA: Diagnosis not present

## 2022-01-26 DIAGNOSIS — Z8739 Personal history of other diseases of the musculoskeletal system and connective tissue: Secondary | ICD-10-CM | POA: Diagnosis not present

## 2022-01-26 DIAGNOSIS — T466X5A Adverse effect of antihyperlipidemic and antiarteriosclerotic drugs, initial encounter: Secondary | ICD-10-CM | POA: Diagnosis not present

## 2022-01-26 DIAGNOSIS — E7801 Familial hypercholesterolemia: Secondary | ICD-10-CM | POA: Diagnosis not present

## 2022-01-26 DIAGNOSIS — G720 Drug-induced myopathy: Secondary | ICD-10-CM | POA: Diagnosis not present

## 2022-01-26 DIAGNOSIS — K219 Gastro-esophageal reflux disease without esophagitis: Secondary | ICD-10-CM | POA: Diagnosis not present

## 2022-02-03 DIAGNOSIS — Z1329 Encounter for screening for other suspected endocrine disorder: Secondary | ICD-10-CM | POA: Diagnosis not present

## 2022-02-15 DIAGNOSIS — G5603 Carpal tunnel syndrome, bilateral upper limbs: Secondary | ICD-10-CM | POA: Diagnosis not present

## 2022-02-24 DIAGNOSIS — G5603 Carpal tunnel syndrome, bilateral upper limbs: Secondary | ICD-10-CM | POA: Diagnosis not present

## 2022-02-24 DIAGNOSIS — M720 Palmar fascial fibromatosis [Dupuytren]: Secondary | ICD-10-CM | POA: Diagnosis not present

## 2022-02-28 ENCOUNTER — Other Ambulatory Visit: Payer: Self-pay

## 2022-02-28 ENCOUNTER — Ambulatory Visit: Payer: 59 | Admitting: "Endocrinology

## 2022-02-28 ENCOUNTER — Encounter: Payer: Self-pay | Admitting: "Endocrinology

## 2022-02-28 ENCOUNTER — Encounter: Payer: Self-pay | Admitting: Internal Medicine

## 2022-02-28 VITALS — BP 130/84 | HR 56 | Ht 67.0 in | Wt 192.2 lb

## 2022-02-28 DIAGNOSIS — E291 Testicular hypofunction: Secondary | ICD-10-CM | POA: Diagnosis not present

## 2022-02-28 NOTE — Progress Notes (Signed)
02/28/2022                                                   Endocrinology Consult Note   Jeffrey Allen, 66 y.o., male   Chief Complaint  Patient presents with   Establish Care    Low testosterone     Past Medical History:  Diagnosis Date   GERD (gastroesophageal reflux disease)    Gout    Obesity (BMI 30.0-34.9)    Peptic ulcer disease    Prediabetes    Stroke [facility]) 06/2017   Past Surgical History:  Procedure Laterality Date   BIOPSY  11/25/2020   Procedure: BIOPSY;  Surgeon: Daneil Dolin, MD;  [facility]: [facility];  Service: Endoscopy;;  gastric   COLONOSCOPY     COLONOSCOPY N/A 12/07/2016   Scattered small and large-mouthed diverticula in sigmoid and descending colon. Six semi-pedunculated polyps in sigmoid and ascending colon, 4-6 mm in size. Distal TI normal appearing. Hyperplastic and tubular adenomas. 3 year surveillance recommended.    COLONOSCOPY N/A 11/25/2020   diverticulosis in sigmoid and descending colon, three 3-4 mm polyps at rectosigmoid colon and hepatic flexure. Hyperplastic polyp. 5 year colonoscopy.    ESOPHAGOGASTRODUODENOSCOPY N/A 11/25/2020     normal esophagus, portal gastropathy, normal duodenum. Mild chronic gastritis, negative H.pylori.    POLYPECTOMY  11/25/2020   Procedure: POLYPECTOMY;  Surgeon: Daneil Dolin, MD;  [facility];  Service: Endoscopy;;   Social History   Socioeconomic History   Marital status: Married    Spouse name: Not on file   Number of children: Not on file   Years of education: Not on file   Highest education level: Not on file  Occupational History   Not on file  Tobacco Use   Smoking status: Former    Packs/day: 0.02    Years: 25.00    Pack years: 0.50    Types: Cigarettes    Quit date: 12/26/2006    Years since quitting: 15.1   Smokeless tobacco: Never  Vaping Use   Vaping Use: Never used  Substance and Sexual Activity   Alcohol use: Yes    Comment: beer twice per week, 2 at a time.     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   Social Determinants of Health   Financial Resource Strain: Not on file  Food Insecurity: Not on file  Transportation Needs: Not on file  Physical Activity: Not on file  Stress: Not on file  Social Connections: Not on file   Outpatient Encounter Medications as of 02/28/2022  Medication Sig   Ascorbic Acid (VITAMIN C PO) Take by mouth daily.   aspirin EC 81 MG EC tablet Take 1 tablet (81 mg total) by mouth daily.   Red Yeast Rice Extract (RED YEAST RICE PO) Take by mouth daily.   allopurinol (ZYLOPRIM) 300 MG tablet Take 0.5 tablets (150 mg total) by mouth daily.   Cholecalciferol (VITAMIN D3) 125 MCG (5000 UT) TABS Take 10,000 Units by mouth daily.    gabapentin (NEURONTIN) 100 MG capsule TAKE 1 CAPSULE BY MOUTH THREE TIMES A DAY. (Patient taking differently: daily.)   Garlic 4696 MG CAPS Take 1,000 mg  by mouth daily. (Patient not taking: Reported on 02/28/2022)   pantoprazole (PROTONIX) 40 MG tablet Take 1 tablet (40 mg total) by mouth daily.   Probiotic Product (PROBIOTIC-10 PO) Take 1 tablet by mouth daily. 18 thousand (Patient not taking: Reported on 02/28/2022)   testosterone cypionate (DEPOTESTOSTERONE CYPIONATE) 200 MG/ML injection INJECT (0.5)ML ONCE WEEKLY AS DIRECTED. (Patient not taking: Reported on 02/28/2022)   TUBERCULIN SYR 1CC/25GX5/8" 25G X 5/8" 1 ML MISC    vitamin B-12 (CYANOCOBALAMIN) 1000 MCG tablet Take 1,000 mcg by mouth daily.   [DISCONTINUED] amoxicillin-clavulanate (AUGMENTIN) 875-125 MG tablet Take 1 tablet by mouth 2 (two) times daily.   [DISCONTINUED] levocetirizine (XYZAL) 5 MG tablet Take 1 tablet (5 mg total) by mouth every evening for 14 days.   [DISCONTINUED] promethazine-dextromethorphan (PROMETHAZINE-DM) 6.25-15 MG/5ML syrup Take 5 mLs by mouth 4 (four) times daily as needed for cough.   No  facility-administered encounter medications on file as of 02/28/2022.   ALLERGIES: Allergies  Allergen Reactions   Prednisone Other (See Comments)    Peptic ulcers     Statins     VACCINATION STATUS: Immunization History  Administered Date(s) Administered   Influenza,inj,Quad PF,6+ Mos 11/07/2019, 10/15/2020   Tdap 02/16/2021    HPI: Jeffrey Allen is a 66 y.o.-year-old man, being seen in consultation for evaluation and management of low testosterone requested by by his   provider  [place] Nation, MD.   He is accompanied by his wife to clinic.  History is obtained from the patient as well as his chart review. Reportedly, he was diagnosed with hypogonadism in 2019 with total testosterone of 220.  Subsequently, he was treated with injectable testosterone with increase in his testosterone to about 1000 on 2 separate occasions.  During the course of his testosterone treatment he developed polycythemia.  Recently he saw a new physician for primary care who was concerned about testosterone treatment while he is having polycythemia and was to stop treatment 5 months ago.  More recent lab work on February 04, 2022 showed total testosterone of 168. Patient reports history of fatigue, low libido prior to initiation of testosterone.  He is a former smoker with history of CVA in July 2019. He still has fatigue, low libido, not on testosterone for at least 5 months.  He is not looking for any fertility.  He denies testicular injury, chemotherapy nor radiation.  He denies any history of head injury.  No recent rapid weight change. No chronic diseases. No chronic pain. Not on opiates, does not take steroids.  He denies excessive alcohol intake. No anabolic steroids use. Not on antidepressants.  He does not have family  Or personal history of premature  cardiac disease.   ROS: Constitutional: + Minimally fluctuating body weight,  + fatigue, no subjective hyperthermia/hypothermia Eyes: no  blurry vision, no xerophthalmia ENT: no sore throat, no nodules palpated in throat, no dysphagia/odynophagia, no hoarseness Cardiovascular: no CP/SOB/palpitations/leg swelling Respiratory: no cough/SOB Gastrointestinal: no N/V/D/C Musculoskeletal: no muscle/joint aches Skin: no rashes Neurological: no tremors/numbness/tingling/dizziness Psychiatric: no depression/anxiety  PE: BP 130/84    Pulse (!) 56    Ht '5\' 7"'$  (1.702 m)    Wt 192 lb 3.2 oz (87.2 kg)    BMI 30.10 kg/m  Wt Readings from Last 3 Encounters:  02/28/22 192 lb 3.2 oz (87.2 kg)  07/14/21 191 lb 9.6 oz (86.9 kg)  02/16/21 194 lb 12.8 oz (88.4 kg)   Constitutional:  Body mass index is 30.1 kg/m.,  not in  acute distress, + Normal State of Mind Eyes: PERRLA, EOMI, no exophthalmos ENT: moist mucous membranes, no thyromegaly, no cervical lymphadenopathy Cardiovascular: RRR, No MRG Respiratory: CTA B Gastrointestinal: abdomen soft, NT, ND, BS+ Musculoskeletal: no deformities, strength intact in all 4 Skin: moist, warm, no rashes Neurological: no tremor with outstretched hands, DTR normal in all 4 Genital exam: normal male escutcheon, no inguinal LAD, normal phallus, testes are shrunk bilaterally to 6 to 8 cc,  no testicular masses, no penile discharge.  No gynecomastia.   CMP ( most recent) CMP     Component Value Date/Time   NA 139 02/16/2021 0940   K 4.3 02/16/2021 0940   CL 102 02/16/2021 0940   CO2 27 02/16/2021 0940   GLUCOSE 92 02/16/2021 0940   BUN 19 02/16/2021 0940   CREATININE 1.07 02/16/2021 0940   CALCIUM 9.3 02/16/2021 0940   PROT 7.0 02/16/2021 0940   ALBUMIN 3.8 12/31/2019 0521   AST 17 02/16/2021 0940   ALT 18 02/16/2021 0940   ALKPHOS 53 12/31/2019 0521   BILITOT 1.1 02/16/2021 0940   GFRNONAA 73 02/16/2021 0940   GFRAA 85 02/16/2021 0940     Diabetic Labs (most recent): Lab Results  Component Value Date   HGBA1C 5.5 02/16/2021   HGBA1C 5.6 12/29/2019   HGBA1C 5.4 11/07/2019      Lipid Panel ( most recent) Lipid Panel     Component Value Date/Time   CHOL 224 (H) 02/16/2021 0940   TRIG 103 02/16/2021 0940   HDL 40 02/16/2021 0940   CHOLHDL 5.6 (H) 02/16/2021 0940   VLDL 21 12/30/2019 0002   LDLCALC 162 (H) 02/16/2021 0940      Lab Results  Component Value Date   TSH 1.46 02/16/2021   FREET4 1.2 02/16/2021    CBC    Component Value Date/Time   WBC 8.1 02/16/2021 0940   RBC 6.45 (H) 02/16/2021 0940   HGB 20.3 (H) 02/16/2021 0940   HCT 58.7 (H) 02/16/2021 0940   PLT 188 02/16/2021 0940   MCV 91.0 02/16/2021 0940   MCH 31.5 02/16/2021 0940   MCHC 34.6 02/16/2021 0940   RDW 12.8 02/16/2021 0940   LYMPHSABS 1,734 08/27/2020 1631   MONOABS 0.6 06/18/2018 1822   EOSABS 234 08/27/2020 1631   BASOSABS 49 08/27/2020 1631    Latest Reference Range & Units 11/07/19 10:57 04/21/20 09:40  Sex Horm Binding Glob, Serum 22 - 77 nmol/L 20 (L) 24  Testosterone 250 - 827 ng/dL 1,072 (H) 1,077 (H)  Testosterone, Bioavailable 110.0 - 575 ng/dL 516.3 449.8  Testosterone Free 46.0 - 224.0 pg/mL 268.1 (H) 238.9 (H)  (L): Data is abnormally low (H): Data is abnormally high   February 04, 2022 total testosterone 168  ASSESSMENT: 1. Hypogonadism 2.  Polycythemia  PLAN:  I have examined the patient, reviewed his labs and have had a long discussion with the patient regarding his testosterone results.   - I have discussed potential causes of hypogonadism, diagnosis of hypogonadism,  and relevant workup to confirm the diagnosis of hypogonadism before initiating testosterone replacement therapy.   -I do not have a chance to review the circumstance of his diagnosis with hypogonadism.  However, with reported history of total testosterone at 220 for his age it was not a significant hypogonadism to require testosterone replacement.  Subsequent hypogonadism with total testosterone 168 could be because of testicular suppression because of supraphysiologic testosterone injection.    He is advised to stay off of the testosterone supplement  for now.  He will have repeat set of coronary assessment in 3-87-month  If he returns with testosterone less than 200, he will be reconsidered for low-dose testosterone replacement therapy with caution.  This is important given the fact that he has been significant polycythemia, history of stroke, history of smoking putting him for more risk for stroke, DVT. Testosterone treatment is not indicated for fatigue and specifically indicated only for biochemically documented hypogonadism. - In this particular patient with bilaterally shrunk testes , he will likely require some testosterone replacement therapy although shrinking of the testicles could be a result of supraphysiologic exogenous testosterone supplement.  -No prescription was initiated today.   - Time spent with the patient: 60 minutes, of which >50% was spent in obtaining information about his symptoms, reviewing his previous labs, evaluations, and treatments, counseling him about his hypogonadism, and developing a plan to confirm the diagnosis and long term treatment as necessary.  DSatira Sarkparticipated in the discussions, expressed understanding, and voiced agreement with the above plans.  All questions were answered to his satisfaction. he is encouraged to contact clinic should he have any questions or concerns prior to his return visit.  Return in about 3 months (around 05/31/2022) for Fasting Labs  in AM B4 8.  GGlade Lloyd MD C[facility] HospitalGroup RAsc[facility] [place] Hights[address] AvenueROaks [place] [address]Phone: [phone] Fax: [phone]  02/28/2022, 4:01 PM  This note was partially dictated with voice recognition software. Similar sounding words can be transcribed inadequately or may not  be corrected upon review.

## 2022-03-15 ENCOUNTER — Ambulatory Visit (INDEPENDENT_AMBULATORY_CARE_PROVIDER_SITE_OTHER): Payer: 59 | Admitting: Internal Medicine

## 2022-03-15 ENCOUNTER — Other Ambulatory Visit: Payer: Self-pay

## 2022-03-15 ENCOUNTER — Encounter: Payer: Self-pay | Admitting: Internal Medicine

## 2022-03-15 VITALS — BP 128/78 | HR 59 | Temp 96.9°F | Ht 67.0 in | Wt 192.8 lb

## 2022-03-15 DIAGNOSIS — K219 Gastro-esophageal reflux disease without esophagitis: Secondary | ICD-10-CM | POA: Diagnosis not present

## 2022-03-15 DIAGNOSIS — Z86010 Personal history of colonic polyps: Secondary | ICD-10-CM | POA: Diagnosis not present

## 2022-03-15 NOTE — Progress Notes (Signed)
? ? ?Primary Care Physician:  Winfield Nation, MD ?Primary Gastroenterologist:  Dr. Gala Romney ? ?Pre-Procedure History & Physical: ?HPI:  Jeffrey Allen is a 66 y.o. male here for follow-up of GERD.  Doing very well on Protonix 40 mg daily.  No dysphagia.  No bowel symptoms.  History of diverticulitis in the past.  Multiple colon adenomas removed over time; due for surveillance examination 2026. ? ?Past Medical History:  ?Diagnosis Date  ? GERD (gastroesophageal reflux disease)   ? Gout   ? Obesity (BMI 30.0-34.9)   ? Peptic ulcer disease   ? Prediabetes   ? Stroke [facility]) 06/2017  ? ? ?Past Surgical History:  ?Procedure Laterality Date  ? BIOPSY  11/25/2020  ? Procedure: BIOPSY;  Surgeon: Daneil Dolin, MD;  [facility];  Service: Endoscopy;;  gastric  ? COLONOSCOPY    ? COLONOSCOPY N/A 12/07/2016  ? Scattered small and large-mouthed diverticula in sigmoid and descending colon. Six semi-pedunculated polyps in sigmoid and ascending colon, 4-6 mm in size. Distal TI normal appearing. Hyperplastic and tubular adenomas. 3 year surveillance recommended.   ? COLONOSCOPY N/A 11/25/2020  ? diverticulosis in sigmoid and descending colon, three 3-4 mm polyps at rectosigmoid colon and hepatic flexure. Hyperplastic polyp. 5 year colonoscopy.   ? ESOPHAGOGASTRODUODENOSCOPY N/A 11/25/2020  ?   normal esophagus, portal gastropathy, normal duodenum. Mild chronic gastritis, negative H.pylori.   ? POLYPECTOMY  11/25/2020  ? Procedure: POLYPECTOMY;  Surgeon: Daneil Dolin, MD;  [facility];  Service: Endoscopy;;  ? ? ?Prior to Admission medications   ?Medication Sig Start Date End Date Taking? Authorizing Provider  ?allopurinol (ZYLOPRIM) 300 MG tablet Take 0.5 tablets (150 mg total) by mouth daily. 05/18/21  Yes Gosrani, Nimish C, MD  ?Ascorbic Acid (VITAMIN C PO) Take by mouth daily.   Yes [provider]  ?aspirin EC 81 MG EC tablet Take 1 tablet (81 mg total) by mouth daily. 01/01/20  Yes Tat, Shanon Brow,  MD  ?Cholecalciferol (VITAMIN D3) 125 MCG (5000 UT) TABS Take 10,000 Units by mouth daily.    Yes [provider]  ?gabapentin (NEURONTIN) 100 MG capsule TAKE 1 CAPSULE BY MOUTH THREE TIMES A DAY. ?Patient taking differently: daily. 09/22/21  Yes Lindell Spar, MD  ?pantoprazole (PROTONIX) 40 MG tablet Take 1 tablet (40 mg total) by mouth daily. 07/14/21  Yes Gosrani, Nimish C, MD  ?Red Yeast Rice Extract (RED YEAST RICE PO) Take by mouth daily.   Yes [provider]  ?vitamin B-12 (CYANOCOBALAMIN) 1000 MCG tablet Take 1,000 mcg by mouth daily.   Yes [provider]  ?Garlic 6222 MG CAPS Take 1,000 mg by mouth daily. ?Patient not taking: Reported on 02/28/2022    [provider]  ?Probiotic Product (PROBIOTIC-10 PO) Take 1 tablet by mouth daily. 18 thousand ?Patient not taking: Reported on 02/28/2022    [provider]  ?TUBERCULIN SYR 1CC/25GX5/8" 25G X 5/8" 1 ML MISC  01/21/21   [provider]  ? ? ?Allergies as of 03/15/2022 - Review Complete 03/15/2022  ?Allergen Reaction Noted  ? Prednisone Other (See Comments) 09/04/2015  ? Statins  02/28/2022  ? ? ?Family History  ?Problem Relation Age of Onset  ? Dementia Mother   ? Hypertension Father   ? Diabetes Father   ? COPD Father   ? Hyperlipidemia Father   ? Heart attack Father   ? Colon cancer Neg Hx   ? ? ?Social History  ? ?Socioeconomic  History  ? Marital status: Married  ?  Spouse name: Not on file  ? Number of children: Not on file  ? Years of education: Not on file  ? Highest education level: Not on file  ?Occupational History  ? Not on file  ?Tobacco Use  ? Smoking status: Former  ?  Packs/day: 0.02  ?  Years: 25.00  ?  Pack years: 0.50  ?  Types: Cigarettes  ?  Quit date: 12/26/2006  ?  Years since quitting: 15.2  ? Smokeless tobacco: Never  ?Vaping Use  ? Vaping Use: Never used  ?Substance and Sexual Activity  ? Alcohol use: Not Currently  ?  Comment: beer twice per week, 2 at a time.   ? Drug use: No  ?  Sexual activity: Yes  ?Other Topics Concern  ? Not on file  ?Social History Narrative  ? 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  ? ?Social Determinants of Health  ? ?Financial Resource Strain: Not on file  ?Food Insecurity: Not on file  ?Transportation Needs: Not on file  ?Physical Activity: Not on file  ?Stress: Not on file  ?Social Connections: Not on file  ?Intimate Partner Violence: Not on file  ? ? ?Review of Systems: ?See HPI, otherwise negative ROS ? ?Physical Exam: ?BP 128/78 (BP Location: Right Arm, Patient Position: Sitting, Cuff Size: Normal)   Pulse (!) 59   Temp (!) 96.9 ?F (36.1 ?C) (Temporal)   Ht '5\' 7"'$  (1.702 m)   Wt 192 lb 12.8 oz (87.5 kg)   SpO2 97%   BMI 30.20 kg/m?  ?General:   Alert,  Well-developed, well-nourished, pleasant and cooperative in NAD ?Mouth:  No deformity or lesions. ?Neck:  Supple; no masses or thyromegaly. No significant cervical adenopathy. ?Lungs:  Clear throughout to auscultation.   No wheezes, crackles, or rhonchi. No acute distress. ?Heart:  Regular rate and rhythm; no murmurs, clicks, rubs,  or gallops. ?Abdomen: Non-distended, normal bowel sounds.  Soft and nontender without appreciable mass or hepatosplenomegaly.  ? ?Impression/Plan: 66 year old gentleman with well-controlled GERD.  No alarm features.  History of colonic adenomas; due for surveillance examination 2020. ? ?Recommendations ? ?As discussed, continue Protonix 40 mg every day in the morning before breakfast ? ?Plan for repeat colonoscopy 2026-history of colonic polyps ? ?Office visit with [place] in 1 year. ? ? ?Notice: This dictation was prepared with Dragon dictation along with smaller phrase technology. Any transcriptional errors that result from this process are unintentional and may not be corrected upon review.  ?

## 2022-03-15 NOTE — Patient Instructions (Signed)
It was great to see you again today! ? ?As discussed, continue Protonix 40 mg every day in the morning before breakfast ? ?Plan for repeat colonoscopy 2026-history of colonic polyps ? ?Office visit with [place] in 1 year. ?

## 2022-05-16 ENCOUNTER — Encounter: Payer: Self-pay | Admitting: Emergency Medicine

## 2022-05-16 ENCOUNTER — Ambulatory Visit
Admission: EM | Admit: 2022-05-16 | Discharge: 2022-05-16 | Disposition: A | Discharge status: Home / Self Care | Payer: 59 | Attending: Nurse Practitioner | Admitting: Nurse Practitioner

## 2022-05-16 DIAGNOSIS — R42 Dizziness and giddiness: Secondary | ICD-10-CM | POA: Diagnosis not present

## 2022-05-16 DIAGNOSIS — J019 Acute sinusitis, unspecified: Secondary | ICD-10-CM

## 2022-05-16 MED ORDER — IPRATROPIUM BROMIDE 0.03 % NA SOLN: 2.0000 | Freq: Two times a day (BID) | NASAL | 0 refills | Status: DC

## 2022-05-16 MED ORDER — AMOXICILLIN-POT CLAVULANATE 875-125 MG PO TABS: 1.0000 | ORAL_TABLET | Freq: Two times a day (BID) | ORAL | 0 refills | Status: AC

## 2022-05-16 NOTE — ED Provider Notes (Signed)
[facility]    CSN: [id number] Arrival date & time: 05/16/22  4967      History   Chief Complaint No chief complaint on file.   HPI Jeffrey Allen is a 66 y.o. male.   The patient is a 66 year old male who presents with his wife for complaints of sinus symptoms.  Patient complains of runny nose, face sore, head hurts when he shakes his head.  Productive cough with light yellow sputum.  Symptoms over a month off and on.  He also complains that he has been feeling fatigued.  Patient states he felt lightheaded last night.  States he thinks he was biting the left side of his jaw last night.  States right side of jaw wasn't working right last night when trying to talk.  Symptoms last about a minute and then went away.  Patient's wife states that he was "unresponsive" for approximately 1 minute.  She states that he finally came out of it and his symptoms have since went away.  Patient states that he has a history of a CVA in 2019.  The history is provided by the patient and the spouse.   Past Medical History:  Diagnosis Date   GERD (gastroesophageal reflux disease)    Gout    Obesity (BMI 30.0-34.9)    Peptic ulcer disease    Prediabetes    Stroke ([place]) 06/2017    Patient Active Problem List   Diagnosis Date Noted   Diverticulitis of colon 10/07/2020   History of colonic polyps 10/07/2020   Vitamin D deficiency 02/10/2020   Chest pain 12/30/2019   COVID-19 virus infection 12/30/2019   NSTEMI (non-ST elevated myocardial infarction) ([place]) 12/29/2019   Prediabetes 11/07/2019   Testicular failure 11/07/2019   Obesity (BMI 30.0-34.9) 11/07/2019   Stroke ([place])    Essential hypertension 06/19/2018   Hyperlipidemia 06/19/2018   Gout 06/18/2018   GERD (gastroesophageal reflux disease) 06/18/2018   Acute CVA (cerebrovascular accident) ([place]) 06/18/2018   CARPAL TUNNEL SYNDROME 04/06/2009   CUBITAL TUNNEL SYNDROME 04/06/2009   NECK PAIN, CHRONIC 04/06/2009    Disturbance of skin sensation 04/06/2009   ANKLE SPRAIN 12/03/2008    Past Surgical History:  Procedure Laterality Date   BIOPSY  11/25/2020   Procedure: BIOPSY;  Surgeon: Daneil Dolin, MD;  [facility];  Service: Endoscopy;;  gastric   COLONOSCOPY     COLONOSCOPY N/A 12/07/2016   Scattered small and large-mouthed diverticula in sigmoid and descending colon. Six semi-pedunculated polyps in sigmoid and ascending colon, 4-6 mm in size. Distal TI normal appearing. Hyperplastic and tubular adenomas. 3 year surveillance recommended.    COLONOSCOPY N/A 11/25/2020   diverticulosis in sigmoid and descending colon, three 3-4 mm polyps at rectosigmoid colon and hepatic flexure. Hyperplastic polyp. 5 year colonoscopy.    ESOPHAGOGASTRODUODENOSCOPY N/A 11/25/2020     normal esophagus, portal gastropathy, normal duodenum. Mild chronic gastritis, negative H.pylori.    POLYPECTOMY  11/25/2020   Procedure: POLYPECTOMY;  Surgeon: Daneil Dolin, MD;  [facility];  Service: Endoscopy;;       Home Medications    Prior to Admission medications   Medication Sig Start Date End Date Taking? Authorizing Provider  allopurinol (ZYLOPRIM) 300 MG tablet Take 0.5 tablets (150 mg total) by mouth daily. 05/18/21   Doree Albee, MD  Ascorbic Acid (VITAMIN C PO) Take by mouth daily.    [provider]  aspirin EC 81 MG EC tablet Take 1 tablet (81  mg total) by mouth daily. 01/01/20   Orson Eva, MD  Cholecalciferol (VITAMIN D3) 125 MCG (5000 UT) TABS Take 10,000 Units by mouth daily.     [provider]  gabapentin (NEURONTIN) 100 MG capsule TAKE 1 CAPSULE BY MOUTH THREE TIMES A DAY. Patient taking differently: daily. 09/22/21   Lindell Spar, MD  Garlic 3419 MG CAPS Take 1,000 mg by mouth daily. Patient not taking: Reported on 02/28/2022    [provider]  pantoprazole (PROTONIX) 40 MG tablet Take 1 tablet (40 mg total) by mouth daily. 07/14/21   Doree Albee, MD  Probiotic Product (PROBIOTIC-10 PO) Take 1 tablet by mouth daily. 18 thousand Patient not taking: Reported on 02/28/2022    [provider]  Red Yeast Rice Extract (RED YEAST RICE PO) Take by mouth daily.    [provider]  TUBERCULIN SYR 1CC/25GX5/8" 25G X 5/8" 1 ML MISC  01/21/21   [provider]  vitamin B-12 (CYANOCOBALAMIN) 1000 MCG tablet Take 1,000 mcg by mouth daily.    [provider]    Family History Family History  Problem Relation Age of Onset   Dementia Mother    Hypertension Father    Diabetes Father    COPD Father    Hyperlipidemia Father    Heart attack Father    Colon cancer Neg Hx     Social History Social History   Tobacco Use   Smoking status: Former    Packs/day: 0.02    Years: 25.00    Pack years: 0.50    Types: Cigarettes    Quit date: 12/26/2006    Years since quitting: 15.3   Smokeless tobacco: Never  Vaping Use   Vaping Use: Never used  Substance Use Topics   Alcohol use: Not Currently    Comment: beer twice per week, 2 at a time.    Drug use: No     Allergies   Prednisone and Statins   Review of Systems Review of Systems  Constitutional:  Positive for fatigue.  HENT:  Positive for congestion, postnasal drip and rhinorrhea. Negative for sore throat.   Respiratory:  Positive for cough. Negative for shortness of breath and wheezing.   Cardiovascular: Negative.   Gastrointestinal: Negative.   Skin: Negative.   Neurological:  Positive for weakness, light-headedness and headaches.  Psychiatric/Behavioral: Negative.      Physical Exam Triage Vital Signs ED Triage Vitals  Enc Vitals Group     BP 05/16/22 0842 (!) 147/89     Pulse Rate 05/16/22 0842 62     Resp 05/16/22 0842 18     Temp 05/16/22 0842 98.5 F (36.9 C)     Temp Source 05/16/22 0842 Oral     SpO2 05/16/22 0842 94 %     Weight --      Height --      Head Circumference --      Peak Flow --      Pain Score 05/16/22 0843 3      Pain Loc --      Pain Edu? --      Excl. in [place]? --    No data found.  Updated Vital Signs BP (!) 147/89 (BP Location: Right Arm)   Pulse 62   Temp 98.5 F (36.9 C) (Oral)   Resp 18   SpO2 94%   Visual Acuity Right Eye Distance:   Left Eye Distance:   Bilateral Distance:    Right Eye Near:  Left Eye Near:    Bilateral Near:     Physical Exam Vitals and nursing note reviewed.  Constitutional:      Appearance: Normal appearance. He is normal weight.  HENT:     Head: Normocephalic.     Right Ear: Tympanic membrane, ear canal and external ear normal.     Left Ear: Tympanic membrane, ear canal and external ear normal.     Nose: Congestion present.  Eyes:     General: No visual field deficit.    Extraocular Movements: Extraocular movements intact.     Conjunctiva/sclera: Conjunctivae normal.     Pupils: Pupils are equal, round, and reactive to light.  Cardiovascular:     Rate and Rhythm: Normal rate and regular rhythm.     Pulses: Normal pulses.     Heart sounds: Normal heart sounds.  Pulmonary:     Effort: Pulmonary effort is normal.     Breath sounds: Normal breath sounds.  Abdominal:     General: Bowel sounds are normal.     Palpations: Abdomen is soft.  Musculoskeletal:     Cervical back: Normal range of motion.  Skin:    General: Skin is warm and dry.     Capillary Refill: Capillary refill takes less than 2 seconds.  Neurological:     General: No focal deficit present.     Mental Status: He is alert and oriented to person, place, and time.     GCS: GCS eye subscore is 4. GCS verbal subscore is 5. GCS motor subscore is 6.     Cranial Nerves: No cranial nerve deficit.     Motor: No weakness.     Coordination: Coordination normal.     Gait: Gait normal.     Deep Tendon Reflexes: Reflexes normal.  Psychiatric:        Mood and Affect: Mood normal.        Behavior: Behavior normal.        Thought Content: Thought content normal.        Judgment: Judgment  normal.     UC Treatments / Results  Labs (all labs ordered are listed, but only abnormal results are displayed) Labs Reviewed - No data to display  EKG   Radiology No results found.  Procedures Procedures (including critical care time)  Medications Ordered in UC Medications - No data to display  Initial Impression / Assessment and Plan / UC Course  I have reviewed the triage vital signs and the nursing notes.  Pertinent labs & imaging results that were available during my care of the patient were reviewed by me and considered in my medical decision making (see chart for details).  Based on the patient's history and physical, his symptoms are consistent with an acute sinusitis.  He has moderate nasal congestion, and postnasal drainage.  With regard to his CVA a symptoms, recommended the patient be further evaluated in the ER.  Advised patient that even though he is currently asymptomatic, cannot rule out that he did not have a neurological event last evening.  Patient and his wife verbalized understanding.  He is able to travel to the ER by private vehicle as his vital signs are stable.  Follow-up as needed. Final Clinical Impressions(s) / UC Diagnoses   Final diagnoses:  None   Discharge Instructions   None    ED Prescriptions   None    PDMP not reviewed this encounter.   Tish Men, NP 05/16/22 [phone]

## 2022-05-16 NOTE — Discharge Instructions (Addendum)
Take medication as prescribed. Increase fluids and allow for plenty of rest. With regard to your neurological symptoms, based on your exam, your neurological status is normal; however, I cannot definitively diagnose a TIA versus CVA at this time.  You will need to follow-up in the ER for further evaluation. Go to the ER immediately if you develop worsening weakness, dizziness, facial drooping, slurred speech, or inability to respond. Please follow-up with your PCP immediately.

## 2022-05-16 NOTE — ED Triage Notes (Signed)
Runny nose, face sore, head hurts when he shakes his head.  Productive cough with light yellow sputum.  Symptoms over a month off and on.  Feels tired.    Felt lightheaded last night.  States he thinks he was biting the left side of his jaw last night.  States right side of jaw wasn't working right last night when trying to talk.  Symptoms last about a minute and then went away.

## 2022-05-24 DIAGNOSIS — I10 Essential (primary) hypertension: Secondary | ICD-10-CM | POA: Diagnosis not present

## 2022-05-24 DIAGNOSIS — R0789 Other chest pain: Secondary | ICD-10-CM | POA: Diagnosis not present

## 2022-05-24 DIAGNOSIS — Z6830 Body mass index (BMI) 30.0-30.9, adult: Secondary | ICD-10-CM | POA: Diagnosis not present

## 2022-05-24 DIAGNOSIS — R42 Dizziness and giddiness: Secondary | ICD-10-CM | POA: Diagnosis not present

## 2022-05-24 DIAGNOSIS — E291 Testicular hypofunction: Secondary | ICD-10-CM | POA: Diagnosis not present

## 2022-05-25 ENCOUNTER — Emergency Department (HOSPITAL_COMMUNITY): Payer: 59

## 2022-05-25 ENCOUNTER — Encounter: Payer: Self-pay | Admitting: Physician Assistant

## 2022-05-25 ENCOUNTER — Observation Stay (HOSPITAL_COMMUNITY)
Admission: EM | Admit: 2022-05-25 | Discharge: 2022-05-27 | Disposition: A | Discharge status: Home / Self Care | Payer: 59 | Attending: Cardiovascular Disease | Admitting: Cardiovascular Disease

## 2022-05-25 ENCOUNTER — Other Ambulatory Visit: Payer: Self-pay

## 2022-05-25 ENCOUNTER — Encounter (HOSPITAL_COMMUNITY): Payer: Self-pay | Admitting: *Deleted

## 2022-05-25 DIAGNOSIS — I25110 Atherosclerotic heart disease of native coronary artery with unstable angina pectoris: Secondary | ICD-10-CM | POA: Diagnosis not present

## 2022-05-25 DIAGNOSIS — Z7982 Long term (current) use of aspirin: Secondary | ICD-10-CM | POA: Diagnosis not present

## 2022-05-25 DIAGNOSIS — R079 Chest pain, unspecified: Secondary | ICD-10-CM | POA: Diagnosis not present

## 2022-05-25 DIAGNOSIS — M1A0790 Idiopathic chronic gout, unspecified ankle and foot, without tophus (tophi): Secondary | ICD-10-CM | POA: Diagnosis not present

## 2022-05-25 DIAGNOSIS — I110 Hypertensive heart disease with heart failure: Secondary | ICD-10-CM | POA: Diagnosis not present

## 2022-05-25 DIAGNOSIS — R55 Syncope and collapse: Secondary | ICD-10-CM | POA: Diagnosis not present

## 2022-05-25 DIAGNOSIS — E785 Hyperlipidemia, unspecified: Secondary | ICD-10-CM

## 2022-05-25 DIAGNOSIS — I10 Essential (primary) hypertension: Secondary | ICD-10-CM | POA: Diagnosis not present

## 2022-05-25 DIAGNOSIS — Z955 Presence of coronary angioplasty implant and graft: Secondary | ICD-10-CM

## 2022-05-25 DIAGNOSIS — Z8673 Personal history of transient ischemic attack (TIA), and cerebral infarction without residual deficits: Secondary | ICD-10-CM | POA: Diagnosis not present

## 2022-05-25 DIAGNOSIS — Z87891 Personal history of nicotine dependence: Secondary | ICD-10-CM | POA: Diagnosis not present

## 2022-05-25 DIAGNOSIS — I5030 Unspecified diastolic (congestive) heart failure: Secondary | ICD-10-CM | POA: Diagnosis not present

## 2022-05-25 DIAGNOSIS — Z79899 Other long term (current) drug therapy: Secondary | ICD-10-CM | POA: Diagnosis not present

## 2022-05-25 DIAGNOSIS — R7303 Prediabetes: Secondary | ICD-10-CM | POA: Diagnosis present

## 2022-05-25 DIAGNOSIS — I639 Cerebral infarction, unspecified: Secondary | ICD-10-CM

## 2022-05-25 DIAGNOSIS — I493 Ventricular premature depolarization: Secondary | ICD-10-CM | POA: Diagnosis not present

## 2022-05-25 DIAGNOSIS — R0789 Other chest pain: Secondary | ICD-10-CM | POA: Diagnosis not present

## 2022-05-25 DIAGNOSIS — I214 Non-ST elevation (NSTEMI) myocardial infarction: Secondary | ICD-10-CM

## 2022-05-25 DIAGNOSIS — I200 Unstable angina: Secondary | ICD-10-CM

## 2022-05-25 DIAGNOSIS — M109 Gout, unspecified: Secondary | ICD-10-CM | POA: Diagnosis present

## 2022-05-25 LAB — COMPREHENSIVE METABOLIC PANEL
ALT: 19 IU/L (ref 0–44)
AST: 19 IU/L (ref 0–40)
Albumin/Globulin Ratio: 1.7 (ref 1.2–2.2)
Albumin: 4.2 g/dL (ref 3.8–4.8)
Alkaline Phosphatase: 67 IU/L (ref 44–121)
BUN/Creatinine Ratio: 26 — ABNORMAL HIGH (ref 10–24)
BUN: 25 mg/dL (ref 8–27)
Bilirubin Total: 0.4 mg/dL (ref 0.0–1.2)
CO2: 24 mmol/L (ref 20–29)
Calcium: 9.7 mg/dL (ref 8.6–10.2)
Chloride: 103 mmol/L (ref 96–106)
Creatinine, Ser: 0.98 mg/dL (ref 0.76–1.27)
Globulin, Total: 2.5 g/dL (ref 1.5–4.5)
Glucose: 91 mg/dL (ref 70–99)
Potassium: 4.2 mmol/L (ref 3.5–5.2)
Sodium: 141 mmol/L (ref 134–144)
Total Protein: 6.7 g/dL (ref 6.0–8.5)
eGFR: 86 mL/min/{1.73_m2} (ref >59)

## 2022-05-25 LAB — LIPID PANEL
Chol/HDL Ratio: 5 ratio (ref 0.0–5.0)
Cholesterol, Total: 242 mg/dL — ABNORMAL HIGH (ref 100–199)
HDL: 48 mg/dL (ref >39)
LDL Chol Calc (NIH): 171 mg/dL — ABNORMAL HIGH (ref 0–99)
Triglycerides: 127 mg/dL (ref 0–149)
VLDL Cholesterol Cal: 23 mg/dL (ref 5–40)

## 2022-05-25 LAB — CBC
HCT: 48.1 % (ref 39.0–52.0)
Hemoglobin: 16.6 g/dL (ref 13.0–17.0)
MCH: 31.6 pg (ref 26.0–34.0)
MCHC: 34.5 g/dL (ref 30.0–36.0)
MCV: 91.4 fL (ref 80.0–100.0)
Platelets: 194 10*3/uL (ref 150–400)
RBC: 5.26 MIL/uL (ref 4.22–5.81)
RDW: 11.9 % (ref 11.5–15.5)
WBC: 9 10*3/uL (ref 4.0–10.5)
nRBC: 0 % (ref 0.0–0.2)

## 2022-05-25 LAB — BASIC METABOLIC PANEL
Anion gap: 5 (ref 5–15)
BUN: 25 mg/dL — ABNORMAL HIGH (ref 8–23)
CO2: 24 mmol/L (ref 22–32)
Calcium: 8.9 mg/dL (ref 8.9–10.3)
Chloride: 110 mmol/L (ref 98–111)
Creatinine, Ser: 0.85 mg/dL (ref 0.61–1.24)
GFR, Estimated: >60 mL/min (ref >60)
Glucose, Bld: 123 mg/dL — ABNORMAL HIGH (ref 70–99)
Potassium: 3.5 mmol/L (ref 3.5–5.1)
Sodium: 139 mmol/L (ref 135–145)

## 2022-05-25 LAB — CBC WITH DIFFERENTIAL/PLATELET
Basophils Absolute: 0.1 10*3/uL (ref 0.0–0.2)
Basos: 1 %
EOS (ABSOLUTE): 0.2 10*3/uL (ref 0.0–0.4)
Eos: 3 %
Hematocrit: 50.2 % (ref 37.5–51.0)
Hemoglobin: 17.1 g/dL (ref 13.0–17.7)
Immature Grans (Abs): 0.1 10*3/uL (ref 0.0–0.1)
Immature Granulocytes: 1 %
Lymphocytes Absolute: 1.8 10*3/uL (ref 0.7–3.1)
Lymphs: 23 %
MCH: 31.1 pg (ref 26.6–33.0)
MCHC: 34.1 g/dL (ref 31.5–35.7)
MCV: 91 fL (ref 79–97)
Monocytes Absolute: 0.5 10*3/uL (ref 0.1–0.9)
Monocytes: 6 %
Neutrophils Absolute: 5.1 10*3/uL (ref 1.4–7.0)
Neutrophils: 66 %
Platelets: 201 10*3/uL (ref 150–450)
RBC: 5.5 x10E6/uL (ref 4.14–5.80)
RDW: 12 % (ref 11.6–15.4)
WBC: 7.8 10*3/uL (ref 3.4–10.8)

## 2022-05-25 LAB — TESTOSTERONE, FREE, TOTAL, SHBG
Sex Hormone Binding: 27 nmol/L (ref 19.3–76.4)
Testosterone, Free: 5 pg/mL — ABNORMAL LOW (ref 6.6–18.1)
Testosterone: 198 ng/dL — ABNORMAL LOW (ref 264–916)

## 2022-05-25 LAB — TSH: TSH: 1.23 u[IU]/mL (ref 0.450–4.500)

## 2022-05-25 LAB — MAGNESIUM: Magnesium: 2 mg/dL (ref 1.7–2.4)

## 2022-05-25 LAB — PSA: Prostate Specific Ag, Serum: 0.2 ng/mL (ref 0.0–4.0)

## 2022-05-25 LAB — TROPONIN I (HIGH SENSITIVITY)
Troponin I (High Sensitivity): 54 ng/L — ABNORMAL HIGH (ref <18)
Troponin I (High Sensitivity): 50 ng/L — ABNORMAL HIGH (ref <18)

## 2022-05-25 LAB — PROTIME-INR
INR: 1 (ref 0.8–1.2)
Prothrombin Time: 13.4 seconds (ref 11.4–15.2)

## 2022-05-25 LAB — T4, FREE: Free T4: 1.57 ng/dL (ref 0.82–1.77)

## 2022-05-25 MED ORDER — PANTOPRAZOLE SODIUM 40 MG PO TBEC
40.0000 mg | DELAYED_RELEASE_TABLET | Freq: Every day | ORAL | Status: DC
  Administered 2022-05-26 – 2022-05-27 (×2): 40 mg via ORAL
  Filled 2022-05-25 (×2): qty 1

## 2022-05-25 MED ORDER — HEPARIN (PORCINE) 25000 UT/250ML-% IV SOLN
1150.0000 [IU]/h | INTRAVENOUS | Status: DC
  Administered 2022-05-25: 1000 [IU]/h via INTRAVENOUS
  Filled 2022-05-25 (×3): qty 250

## 2022-05-25 MED ORDER — ALLOPURINOL 300 MG PO TABS
150.0000 mg | ORAL_TABLET | Freq: Every day | ORAL | Status: DC
  Administered 2022-05-26 – 2022-05-27 (×2): 150 mg via ORAL
  Filled 2022-05-25: qty 1
  Filled 2022-05-25: qty 2

## 2022-05-25 MED ORDER — ONDANSETRON HCL 4 MG PO TABS
4.0000 mg | ORAL_TABLET | Freq: Four times a day (QID) | ORAL | Status: DC | PRN
Start: 2022-05-25 — End: 2022-05-26

## 2022-05-25 MED ORDER — ONDANSETRON HCL 4 MG/2ML IJ SOLN: 4.0000 mg | Freq: Four times a day (QID) | INTRAMUSCULAR | Status: DC | PRN

## 2022-05-25 MED ORDER — ASPIRIN 81 MG PO TBEC
81.0000 mg | DELAYED_RELEASE_TABLET | Freq: Every day | ORAL | Status: DC
  Administered 2022-05-26 – 2022-05-27 (×2): 81 mg via ORAL
  Filled 2022-05-25 (×2): qty 1

## 2022-05-25 MED ORDER — POTASSIUM CHLORIDE CRYS ER 20 MEQ PO TBCR
40.0000 meq | EXTENDED_RELEASE_TABLET | Freq: Once | ORAL | Status: AC
  Administered 2022-05-25: 40 meq via ORAL
  Filled 2022-05-25: qty 2

## 2022-05-25 MED ORDER — HEPARIN BOLUS VIA INFUSION
4000.0000 [IU] | Freq: Once | INTRAVENOUS | Status: AC
  Administered 2022-05-25: 4000 [IU] via INTRAVENOUS

## 2022-05-25 MED ORDER — ASPIRIN 81 MG PO CHEW
324.0000 mg | CHEWABLE_TABLET | Freq: Once | ORAL | Status: AC
  Administered 2022-05-25: 324 mg via ORAL
  Filled 2022-05-25: qty 4

## 2022-05-25 MED ORDER — GABAPENTIN 100 MG PO CAPS
100.0000 mg | ORAL_CAPSULE | Freq: Every day | ORAL | Status: DC
  Administered 2022-05-25 – 2022-05-26 (×2): 100 mg via ORAL
  Filled 2022-05-25 (×2): qty 1

## 2022-05-25 MED ORDER — SODIUM CHLORIDE 0.9 % IV SOLN
1000.0000 mL | INTRAVENOUS | Status: DC
  Administered 2022-05-25: 1000 mL via INTRAVENOUS

## 2022-05-25 MED ORDER — ACETAMINOPHEN 650 MG RE SUPP: 650.0000 mg | Freq: Four times a day (QID) | RECTAL | Status: DC | PRN

## 2022-05-25 MED ORDER — NITROGLYCERIN 0.4 MG SL SUBL
0.4000 mg | SUBLINGUAL_TABLET | SUBLINGUAL | Status: DC | PRN
  Administered 2022-05-26: 0.4 mg via SUBLINGUAL
  Filled 2022-05-25: qty 1

## 2022-05-25 MED ORDER — SODIUM CHLORIDE 0.9 % IV BOLUS (SEPSIS)
1000.0000 mL | Freq: Once | INTRAVENOUS | Status: AC
  Administered 2022-05-25: 1000 mL via INTRAVENOUS

## 2022-05-25 MED ORDER — POLYETHYLENE GLYCOL 3350 17 G PO PACK: 17.0000 g | PACK | Freq: Every day | ORAL | Status: DC | PRN

## 2022-05-25 MED ORDER — ACETAMINOPHEN 325 MG PO TABS
650.0000 mg | ORAL_TABLET | Freq: Four times a day (QID) | ORAL | Status: DC | PRN
  Administered 2022-05-25: 650 mg via ORAL
  Filled 2022-05-25: qty 2

## 2022-05-25 NOTE — Assessment & Plan Note (Signed)
_ HgbA1c

## 2022-05-25 NOTE — Assessment & Plan Note (Signed)
Resume allopurinol 

## 2022-05-25 NOTE — Assessment & Plan Note (Signed)
Stable.  Not on medication.

## 2022-05-25 NOTE — H&P (Signed)
History and Physical    Jeffrey Allen PIR:[id number] DOB: 11/16/1956 DOA: 05/25/2022  PCP: Jennette Nation, MD   Patient coming from: Home  I have personally briefly reviewed patient's old medical records in [place]  Chief Complaint: Chest pain  HPI: Jeffrey Allen is a 66 y.o. male with medical history significant for CVA, hypertension, peptic ulcer disease, gout. Patient presented to the ED with complaints of chest pain/tightness of 10 days duration.  Reports chest pain is mostly mid to left-sided, radiates up the right side of his neck.  He describes the chest pain as something pushing on his chest.  Chest pain has been intermittent, with associated difficulty breathing, and dizzines.chest pain has at rest and with activity.  Patient particularly reports exhaustion and difficulty breathing with exertion, that resolves at rest.  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  No leg swelling, no history of blood clots in lungs or legs.  He is on aspirin 81 mg daily.  Patient had some symptoms in his right jaw and had this teeth pulled on the 23rd last week.  Chest pain started before this.  He saw his primary care provider yesterday and had an EKG done, he was told there was some abnormalities and he might have had a heart attack.  ED Course: Temperature 98.  Heart rate 68-84.  Respirate rate 14-24.  Blood pressure systolic 1 1 1-1 3 6.  O2 sat greater than 96% on room air.  Potassium 3.5.  Troponin 54.  Chest x-ray clear.  EKG shows sinus rhythm at 66, with frequent PVCs. Heparin drip was started due to patient's history. 1 L bolus given.  EDP talked to cardiology recommended admission here.  Review of Systems: As per HPI all other systems reviewed and negative.  Past Medical History:  Diagnosis Date   GERD (gastroesophageal reflux disease)    Gout    Obesity (BMI 30.0-34.9)    Peptic ulcer disease    Prediabetes    Stroke [place]) 06/2017    Past Surgical History:  Procedure  Laterality Date   BIOPSY  11/25/2020   Procedure: BIOPSY;  Surgeon: Daneil Dolin, MD;  [facility];  Service: Endoscopy;;  gastric   COLONOSCOPY     COLONOSCOPY N/A 12/07/2016   Scattered small and large-mouthed diverticula in sigmoid and descending colon. Six semi-pedunculated polyps in sigmoid and ascending colon, 4-6 mm in size. Distal TI normal appearing. Hyperplastic and tubular adenomas. 3 year surveillance recommended.    COLONOSCOPY N/A 11/25/2020   diverticulosis in sigmoid and descending colon, three 3-4 mm polyps at rectosigmoid colon and hepatic flexure. Hyperplastic polyp. 5 year colonoscopy.    ESOPHAGOGASTRODUODENOSCOPY N/A 11/25/2020     normal esophagus, portal gastropathy, normal duodenum. Mild chronic gastritis, negative H.pylori.    POLYPECTOMY  11/25/2020   Procedure: POLYPECTOMY;  Surgeon: Daneil Dolin, MD;  [facility];  Service: Endoscopy;;     reports that he quit smoking about 15 years ago. His smoking use included cigarettes. He has a 0.50 pack-year smoking history. He has never used smokeless tobacco. He reports that he does not currently use alcohol. He reports that he does not use drugs.  Allergies  Allergen Reactions   Prednisone Other (See Comments)    Peptic ulcers     Statins     Family History  Problem Relation Age of Onset   Dementia Mother    Hypertension Father    Diabetes Father  COPD Father    Hyperlipidemia Father    Heart attack Father    Colon cancer Neg Hx     Prior to Admission medications   Medication Sig Start Date End Date Taking? Authorizing Provider  allopurinol (ZYLOPRIM) 300 MG tablet Take 0.5 tablets (150 mg total) by mouth daily. 05/18/21  Yes Gosrani, Nimish C, MD  Ascorbic Acid (VITAMIN C PO) Take by mouth daily.   Yes [provider]  aspirin EC 81 MG EC tablet Take 1 tablet (81 mg total) by mouth daily. 01/01/20  Yes Tat, Shanon Brow, MD  Cholecalciferol (VITAMIN D3) 125 MCG (5000 UT) TABS  Take 10,000 Units by mouth daily.    Yes [provider]  gabapentin (NEURONTIN) 100 MG capsule TAKE 1 CAPSULE BY MOUTH THREE TIMES A DAY. Patient taking differently: daily. 09/22/21  Yes Lindell Spar, MD  Garlic 6659 MG CAPS Take 1,000 mg by mouth daily.   Yes [provider]  pantoprazole (PROTONIX) 40 MG tablet Take 1 tablet (40 mg total) by mouth daily. 07/14/21  Yes Gosrani, Nimish C, MD  Probiotic Product (PROBIOTIC-10 PO) Take 1 tablet by mouth daily. 18 thousand   Yes [provider]  Red Yeast Rice Extract (RED YEAST RICE PO) Take 2 tablets by mouth daily.   Yes [provider]  vitamin B-12 (CYANOCOBALAMIN) 1000 MCG tablet Take 1,000 mcg by mouth daily.   Yes [provider]  ipratropium (ATROVENT) 0.03 % nasal spray Place 2 sprays into both nostrils every 12 (twelve) hours. Patient not taking: Reported on 05/25/2022 05/16/22   Tish Men, NP    Physical Exam: Vitals:   05/25/22 1526 05/25/22 1526 05/25/22 1810  BP:  (!) 111/95 (!) 143/81  Pulse:  76 68  Resp:  15 (!) 24  Temp:  97.9 F (36.6 C)   TempSrc:  Oral   SpO2:  96% 99%  Weight: 86.2 kg    Height: '5\' 7"'$  (1.702 m)      Constitutional: NAD, calm, comfortable Vitals:   05/25/22 1526 05/25/22 1526 05/25/22 1810  BP:  (!) 111/95 (!) 143/81  Pulse:  76 68  Resp:  15 (!) 24  Temp:  97.9 F (36.6 C)   TempSrc:  Oral   SpO2:  96% 99%  Weight: 86.2 kg    Height: '5\' 7"'$  (1.702 m)     Eyes: PERRL, lids and conjunctivae normal ENMT: Mucous membranes are moist.   Neck: normal, supple, no masses, no thyromegaly Respiratory: clear to auscultation bilaterally, no wheezing, no crackles. Cardiovascular: Regular rate and rhythm, no murmurs / rubs / gallops. No extremity edema. Abdomen: no tenderness, no masses palpated. No hepatosplenomegaly. Bowel sounds positive.  Musculoskeletal: no clubbing / cyanosis. No joint deformity upper and lower extremities.  Skin: no  rashes, lesions, ulcers. No induration Neurologic: No apparent cranial abnormality moving extremities spontaneously.  Psychiatric: Normal judgment and insight. Alert and oriented x 3. Normal mood.   Labs on Admission: I have personally reviewed following labs and imaging studies  CBC: Recent Labs  Lab 05/24/22 0804 05/25/22 1603  WBC 7.8 9.0  NEUTROABS 5.1  --   HGB 17.1 16.6  HCT 50.2 48.1  MCV 91 91.4  PLT 201 935   Basic Metabolic Panel: Recent Labs  Lab 05/24/22 0804 05/25/22 1603  NA 141 139  K 4.2 3.5  CL 103 110  CO2 24 24  GLUCOSE 91 123*  BUN 25 25*  CREATININE 0.98 0.85  CALCIUM 9.7 8.9  MG  --  2.0   GFR: Estimated Creatinine Clearance: 92 mL/min (by C-G formula based on SCr of 0.85 mg/dL). Liver Function Tests: Recent Labs  Lab 05/24/22 0804  AST 19  ALT 19  ALKPHOS 67  BILITOT 0.4  PROT 6.7  ALBUMIN 4.2   Lipid Profile: Recent Labs    05/24/22 0804  CHOL 242*  HDL 48  LDLCALC 171*  TRIG 127  CHOLHDL 5.0   Thyroid Function Tests: Recent Labs    05/24/22 0804  TSH 1.230  FREET4 1.57   Anemia Panel: No results for input(s): VITAMINB12, FOLATE, FERRITIN, TIBC, IRON, RETICCTPCT in the last 72 hours. Urine analysis:    Component Value Date/Time   COLORURINE YELLOW 08/27/2020 [place] 08/27/2020 1631   LABSPEC 1.021 08/27/2020 1631   PHURINE 5.5 08/27/2020 1631   GLUCOSEU NEGATIVE 08/27/2020 1631   HGBUR TRACE (A) 08/27/2020 1631   BILIRUBINUR negative 08/25/2020 1041   KETONESUR NEGATIVE 08/27/2020 1631   PROTEINUR NEGATIVE 08/27/2020 1631   UROBILINOGEN 1.0 08/25/2020 1041   UROBILINOGEN 0.2 09/04/2015 2156   NITRITE Negative 08/25/2020 1041   NITRITE NEGATIVE 06/18/2018 1822   LEUKOCYTESUR Negative 08/25/2020 1041    Radiological Exams on Admission: DG Chest Portable 1 View  Result Date: 05/25/2022 CLINICAL DATA:  Chest pain EXAM: PORTABLE CHEST 1 VIEW COMPARISON:  12/29/2019 FINDINGS: The heart size and  mediastinal contours are within normal limits. No focal airspace consolidation, pleural effusion, or pneumothorax. IMPRESSION: No active disease. Electronically Signed   By: Davina Poke D.O.   On: 05/25/2022 16:06    EKG: Independently reviewed.  Sinus rhythm rate 66.  QTc 431.  Frequent PVCs off frequent on prior EKG, otherwise no significant change from prior.  Assessment/Plan Principal Problem:   Chest pain Active Problems:   Gout   Essential hypertension   Prediabetes   Stroke [facility])   Assessment and Plan: * Chest pain Troponin 54 > 50 on repeat.  EKG shows more frequent PVCs otherwise without ST or T wave abnormalities.  Portable chest x-ray unremarkable.  Patient was supposed to follow-up for outpatient stress test in 2021, that was not done, after he was hospitalized for chest pain, troponin peaked at 148. EDP talk to cardiology, Dr. Virgina Jock, okay with admission here.  -Heparin drip started in ED, due to concerning history -Cardiology to see in the morning -Obtain echocardiogram -Repeat EKG in the morning -325 mg given, continue aspirin 81 mg daily -N.p.o. midnight -Unspecified allergy to statins, LDL done yesterday is outpatient elevated at 171  Stroke [facility]) Without significant residual deficits. -Resume aspirin   Prediabetes _ HgbA1c  Essential hypertension Stable.  Not on medication.  Gout Resume allopurinol.   DVT prophylaxis: heparin Code Status: Full Family Communication: Spouse at bedside Disposition Plan: ~ 1 - 2 days Consults called: Cards Admission status: obs tele  Author: Bethena Roys, MD 05/25/2022 9:17 PM  For on call review [email or web address].

## 2022-05-25 NOTE — Progress Notes (Signed)
ANTICOAGULATION CONSULT NOTE - Initial Consult  Pharmacy Consult for heparin Indication: chest pain/ACS  Allergies  Allergen Reactions   Prednisone Other (See Comments)    Peptic ulcers     Statins     Patient Measurements: Height: '5\' 7"'$  (170.2 cm) Weight: 86.2 kg (190 lb) IBW/kg (Calculated) : 66.1 Heparin Dosing Weight: 83.7 kg  Vital Signs: Temp: 97.9 F (36.6 C) (05/31 1526) Temp Source: Oral (05/31 1526) BP: 111/95 (05/31 1526) Pulse Rate: 76 (05/31 1526)  Labs: Recent Labs    05/24/22 0804 05/25/22 1603  HGB 17.1 16.6  HCT 50.2 48.1  PLT 201 194  CREATININE 0.98 0.85  TROPONINIHS  --  54*    Estimated Creatinine Clearance: 92 mL/min (by C-G formula based on SCr of 0.85 mg/dL).   Medical History: Past Medical History:  Diagnosis Date   GERD (gastroesophageal reflux disease)    Gout    Obesity (BMI 30.0-34.9)    Peptic ulcer disease    Prediabetes    Stroke ([place]) 06/2017    Medications:  Scheduled:   heparin  4,000 Units Intravenous Once   Infusions:   sodium chloride 1,000 mL (05/25/22 1718)   Followed by   sodium chloride     heparin      Assessment: Jeffrey Allen is a 66 YO male presented to the ED with lightheadedness, dizizness, fatigue and weakness x10 days per MD note. Pharmacy consulted to dose heparin gtt for chest pain/ACS. Baseline CBC wnl. Will obtain stat INR for baseline.  Goal of Therapy:  Heparin level 0.3-0.7 units/ml Monitor platelets by anticoagulation protocol: Yes   Plan:  Give 4000 units bolus x 1 Start heparin infusion at 1000 units/hr Check anti-Xa level in 6-8 hours and daily while on heparin Continue to monitor H&H and platelets  Jeffrey Allen 05/25/2022,5:39 PM

## 2022-05-25 NOTE — Assessment & Plan Note (Signed)
Without significant residual deficits. -Resume aspirin

## 2022-05-25 NOTE — ED Triage Notes (Signed)
Pt presents today with chest pain and tightness; pt c/o dizziness and weakness

## 2022-05-25 NOTE — ED Provider Notes (Signed)
[facility] Provider Note   CSN: [id number] Arrival date & time: 05/25/22  1516     History  Chief Complaint  Patient presents with   Chest Pain    Jeffrey Allen is a 66 y.o. male.   Chest Pain Associated symptoms: no fever    Patient presents to the ED with complaints of 10 days of feeling lightheaded fatigued dizzy and weak.  Patient states he does not have any history of heart disease but does have history of prior stroke.  Usually he is very active working at least 10 hours a day.  The last couple of weeks he has had decreased energy.  He has been having a constant chest discomfort that does not result severe or intense but it is in the center of his chest moves up towards his right shoulder.  He is feeling lightheaded whenever he stands up as if he is going to pass out.  Sometimes it gets worse when he moves his head but most of the time it increases when he tries to get up stand and do something.  Patient went to an urgent care on May 22.  According to those notes he had an episode of syncope prior to that visit.  He was having some sinus symptoms and was felt that he might have sinusitis but they recommended going to the emergency room for further evaluation.  Patient did not do that and did make a follow-up appointment with this doctor.  Patient states he was told the other day might of had a heart attack at some point.  His symptoms persisted so he came to the ED Home Medications Prior to Admission medications   Medication Sig Start Date End Date Taking? Authorizing Provider  allopurinol (ZYLOPRIM) 300 MG tablet Take 0.5 tablets (150 mg total) by mouth daily. 05/18/21  Yes Gosrani, Nimish C, MD  Ascorbic Acid (VITAMIN C PO) Take by mouth daily.   Yes [provider]  aspirin EC 81 MG EC tablet Take 1 tablet (81 mg total) by mouth daily. 01/01/20  Yes Tat, Shanon Brow, MD  Cholecalciferol (VITAMIN D3) 125 MCG (5000 UT) TABS Take 10,000 Units by mouth daily.     Yes [provider]  gabapentin (NEURONTIN) 100 MG capsule TAKE 1 CAPSULE BY MOUTH THREE TIMES A DAY. Patient taking differently: daily. 09/22/21  Yes Lindell Spar, MD  Garlic 4196 MG CAPS Take 1,000 mg by mouth daily.   Yes [provider]  pantoprazole (PROTONIX) 40 MG tablet Take 1 tablet (40 mg total) by mouth daily. 07/14/21  Yes Gosrani, Nimish C, MD  Probiotic Product (PROBIOTIC-10 PO) Take 1 tablet by mouth daily. 18 thousand   Yes [provider]  Red Yeast Rice Extract (RED YEAST RICE PO) Take 2 tablets by mouth daily.   Yes [provider]  vitamin B-12 (CYANOCOBALAMIN) 1000 MCG tablet Take 1,000 mcg by mouth daily.   Yes [provider]  ipratropium (ATROVENT) 0.03 % nasal spray Place 2 sprays into both nostrils every 12 (twelve) hours. Patient not taking: Reported on 05/25/2022 05/16/22   Leath-Warren, Alda Lea, NP      Allergies    Prednisone and Statins    Review of Systems   Review of Systems  Constitutional:  Negative for fever.  Cardiovascular:  Positive for chest pain.   Physical Exam Updated Vital Signs BP (!) 143/81 (BP Location: Left Arm)   Pulse 68   Temp 97.9 F (36.6 C) (Oral)  Resp (!) 24   Ht 1.702 m ('5\' 7"'$ )   Wt 86.2 kg   SpO2 99%   BMI 29.76 kg/m  Physical Exam Vitals and nursing note reviewed.  Constitutional:      General: He is not in acute distress.    Appearance: He is well-developed.  HENT:     Head: Normocephalic and atraumatic.     Right Ear: External ear normal.     Left Ear: External ear normal.  Eyes:     General: No scleral icterus.       Right eye: No discharge.        Left eye: No discharge.     Conjunctiva/sclera: Conjunctivae normal.  Neck:     Trachea: No tracheal deviation.  Cardiovascular:     Rate and Rhythm: Normal rate. Rhythm irregular. Extrasystoles are present. Pulmonary:     Effort: Pulmonary effort is normal. No respiratory distress.     Breath sounds: Normal  breath sounds. No stridor. No wheezing or rales.  Abdominal:     General: Bowel sounds are normal. There is no distension.     Palpations: Abdomen is soft.     Tenderness: There is no abdominal tenderness. There is no guarding or rebound.  Musculoskeletal:        General: No tenderness or deformity.     Cervical back: Neck supple.  Skin:    General: Skin is warm and dry.     Findings: No rash.  Neurological:     General: No focal deficit present.     Mental Status: He is alert.     Cranial Nerves: No cranial nerve deficit (no facial droop, extraocular movements intact, no slurred speech).     Sensory: No sensory deficit.     Motor: No abnormal muscle tone or seizure activity.     Coordination: Coordination normal.  Psychiatric:        Mood and Affect: Mood normal.    ED Results / Procedures / Treatments   Labs (all labs ordered are listed, but only abnormal results are displayed) Labs Reviewed  BASIC METABOLIC PANEL - Abnormal; Notable for the following components:      Result Value   Glucose, Bld 123 (*)    BUN 25 (*)    All other components within normal limits  TROPONIN I (HIGH SENSITIVITY) - Abnormal; Notable for the following components:   Troponin I (High Sensitivity) 54 (*)    All other components within normal limits  CBC  MAGNESIUM  HEPARIN LEVEL (UNFRACTIONATED)  APTT  CBC  PROTIME-INR  CBG MONITORING, ED  TROPONIN I (HIGH SENSITIVITY)    EKG EKG Interpretation  Date/Time:  Wednesday May 25 2022 15:29:18 EDT Ventricular Rate:  66 PR Interval:  226 QRS Duration: 98 QT Interval:  412 QTC Calculation: 431 R Axis:   -9 Text Interpretation: probable sinus arrhythmia with frequent pvs Inferior infarct , age undetermined Abnormal ECG When compared with ECG of 29-Dec-2019 20:12, rate slower, increased ectopy Confirmed by Dorie Rank [phone]) on 05/25/2022 3:33:52 PM  Radiology DG Chest Portable 1 View  Result Date: 05/25/2022 CLINICAL DATA:  Chest pain EXAM:  PORTABLE CHEST 1 VIEW COMPARISON:  12/29/2019 FINDINGS: The heart size and mediastinal contours are within normal limits. No focal airspace consolidation, pleural effusion, or pneumothorax. IMPRESSION: No active disease. Electronically Signed   By: Davina Poke D.O.   On: 05/25/2022 16:06    Procedures .1-3 Lead EKG Interpretation Performed by: Dorie Rank, MD Authorized by: Tomi Bamberger,  Wille Glaser, MD     ECG rate:  70   ECG rate assessment: normal     Rhythm: sinus rhythm     Ectopy: PVCs     Conduction: normal      Medications Ordered in ED Medications  sodium chloride 0.9 % bolus 1,000 mL (0 mLs Intravenous Stopped 05/25/22 1807)    Followed by  0.9 %  sodium chloride infusion (1,000 mLs Intravenous New Bag/Given 05/25/22 1810)  heparin bolus via infusion 4,000 Units (4,000 Units Intravenous Bolus from Bag 05/25/22 1805)    Followed by  heparin ADULT infusion 100 units/mL (25000 units/247m) (1,000 Units/hr Intravenous New Bag/Given 05/25/22 1805)  aspirin chewable tablet 324 mg (324 mg Oral Given 05/25/22 1718)    ED Course/ Medical Decision Making/ A&P Clinical Course as of 05/25/22 1816  Wed May 25, 2022  1633 CBC Normal [JK]  1633 DG Chest Portable 1 View Chest x-ray images and radiology report reviewed.  No acute findings [JK]  1708 Troponin I (High Sensitivity)(!) Troponin elevated [JK]  13299Basic metabolic panel(!) Normal [JK]  1708 Magnesium [JK]  1718 Troponin I (High Sensitivity)(!) [JK]  1738 Case discussed with Dr TAli Lowe cardiology.  [facility]  2683Discussed with Dr EDenton Brickregarding admission [JK]    Clinical Course User Index [JK] KDorie Rank MD                           Medical Decision Making Problems Addressed: Chest pain, unspecified type: acute illness or injury that poses a threat to life or bodily functions PVC (premature ventricular contraction): acute illness or injury Syncope, unspecified syncope type:  acute illness or injury that poses a threat to life or bodily functions  Amount and/or Complexity of Data Reviewed External Data Reviewed: labs and notes.    Details: Prior notes and lab results reviewed.  Patient did have elevated troponins in the past with a prior hospitalization.  Seen by cardiology.  Patient was post to have outpatient nuclear medicine stress test but never had that performed.  Patient states he misunderstood about not having anything to eat or drink on the day of his test.  He did follow-up with his primary doctor and his symptoms have not returned so he never had the stress test Labs: ordered. Decision-making details documented in ED Course. Radiology: ordered and independent interpretation performed. Decision-making details documented in ED Course.  Risk OTC drugs. Prescription drug management. Decision regarding hospitalization.   Patient presents to the ED with complaints of chest pain and weakness.  Patient also had a syncopal episode few days ago.  Patient noted to have ectopy with frequent PVCs in the ED.  Symptoms concerning for the possibility of ACS with his exertional chest pain and fatigue.  Initial troponin was slightly elevated at 54.  Patient given aspirin and started on heparin.  Symptoms resolved at this point.  Case discussed with cardiology and the hospitalist service.  Will admit for further work-up and evaluation        Final Clinical Impression(s) / ED Diagnoses Final diagnoses:  Chest pain, unspecified type  Syncope, unspecified syncope type  PVC (premature ventricular contraction)    Rx / DC Orders ED Discharge Orders     None         KDorie Rank MD 05/25/22 1816

## 2022-05-25 NOTE — Assessment & Plan Note (Addendum)
Troponin 54 > 50 on repeat.  EKG shows more frequent PVCs otherwise without ST or T wave abnormalities.  Portable chest x-ray unremarkable.  Patient was supposed to follow-up for outpatient stress test in 2021, that was not done, after he was hospitalized for chest pain, troponin peaked at 148. EDP talk to cardiology, Dr. Virgina Jock, okay with admission here.  -Heparin drip started in ED, due to concerning history -Cardiology to see in the morning -Obtain echocardiogram -Repeat EKG in the morning -325 mg given, continue aspirin 81 mg daily -N.p.o. midnight -Unspecified allergy to statins, LDL done yesterday is outpatient elevated at 171

## 2022-05-26 ENCOUNTER — Encounter (HOSPITAL_COMMUNITY)
Admission: EM | Disposition: A | Discharge status: Home / Self Care | Payer: Self-pay | Source: Home / Self Care | Attending: Emergency Medicine

## 2022-05-26 ENCOUNTER — Observation Stay (HOSPITAL_BASED_OUTPATIENT_CLINIC_OR_DEPARTMENT_OTHER): Payer: 59

## 2022-05-26 DIAGNOSIS — I25110 Atherosclerotic heart disease of native coronary artery with unstable angina pectoris: Secondary | ICD-10-CM

## 2022-05-26 DIAGNOSIS — Z79899 Other long term (current) drug therapy: Secondary | ICD-10-CM | POA: Diagnosis not present

## 2022-05-26 DIAGNOSIS — R0602 Shortness of breath: Secondary | ICD-10-CM

## 2022-05-26 DIAGNOSIS — R55 Syncope and collapse: Secondary | ICD-10-CM | POA: Diagnosis not present

## 2022-05-26 DIAGNOSIS — I110 Hypertensive heart disease with heart failure: Secondary | ICD-10-CM | POA: Diagnosis not present

## 2022-05-26 DIAGNOSIS — Z7982 Long term (current) use of aspirin: Secondary | ICD-10-CM | POA: Diagnosis not present

## 2022-05-26 DIAGNOSIS — R079 Chest pain, unspecified: Secondary | ICD-10-CM

## 2022-05-26 DIAGNOSIS — I200 Unstable angina: Secondary | ICD-10-CM

## 2022-05-26 DIAGNOSIS — Z87891 Personal history of nicotine dependence: Secondary | ICD-10-CM | POA: Diagnosis not present

## 2022-05-26 DIAGNOSIS — I493 Ventricular premature depolarization: Secondary | ICD-10-CM | POA: Diagnosis not present

## 2022-05-26 DIAGNOSIS — Z8673 Personal history of transient ischemic attack (TIA), and cerebral infarction without residual deficits: Secondary | ICD-10-CM | POA: Diagnosis not present

## 2022-05-26 DIAGNOSIS — I5030 Unspecified diastolic (congestive) heart failure: Secondary | ICD-10-CM | POA: Diagnosis not present

## 2022-05-26 HISTORY — PX: CORONARY STENT INTERVENTION: CATH118234

## 2022-05-26 HISTORY — PX: LEFT HEART CATH AND CORS/GRAFTS ANGIOGRAPHY: CATH118250

## 2022-05-26 LAB — CBC
HCT: 45 % (ref 39.0–52.0)
Hemoglobin: 15.5 g/dL (ref 13.0–17.0)
MCH: 31.8 pg (ref 26.0–34.0)
MCHC: 34.4 g/dL (ref 30.0–36.0)
MCV: 92.4 fL (ref 80.0–100.0)
Platelets: 166 10*3/uL (ref 150–400)
RBC: 4.87 MIL/uL (ref 4.22–5.81)
RDW: 12.1 % (ref 11.5–15.5)
WBC: 9.1 10*3/uL (ref 4.0–10.5)
nRBC: 0 % (ref 0.0–0.2)

## 2022-05-26 LAB — ECHOCARDIOGRAM COMPLETE
AR max vel: 2.44 cm2
AV Area VTI: 2.38 cm2
AV Area mean vel: 2.16 cm2
AV Mean grad: 3 mmHg
AV Peak grad: 6 mmHg
Ao pk vel: 1.22 m/s
Area-P 1/2: 2.22 cm2
Calc EF: 50.8 %
Height: 67 in
MV VTI: 1.83 cm2
S' Lateral: 4 cm
Single Plane A2C EF: 55.2 %
Single Plane A4C EF: 41.4 %
Weight: 3132.3 oz

## 2022-05-26 LAB — HIV ANTIBODY (ROUTINE TESTING W REFLEX): HIV Screen 4th Generation wRfx: NONREACTIVE

## 2022-05-26 LAB — TROPONIN I (HIGH SENSITIVITY): Troponin I (High Sensitivity): 48 ng/L — ABNORMAL HIGH (ref <18)

## 2022-05-26 LAB — HEPARIN LEVEL (UNFRACTIONATED)
Heparin Unfractionated: 0.34 IU/mL (ref 0.30–0.70)
Heparin Unfractionated: 0.23 IU/mL — ABNORMAL LOW (ref 0.30–0.70)

## 2022-05-26 LAB — GLUCOSE, CAPILLARY: Glucose-Capillary: 97 mg/dL (ref 70–99)

## 2022-05-26 LAB — APTT: aPTT: 61 seconds — ABNORMAL HIGH (ref 24–36)

## 2022-05-26 LAB — HEMOGLOBIN A1C
Hgb A1c MFr Bld: 5.7 % — ABNORMAL HIGH (ref 4.8–5.6)
Mean Plasma Glucose: 116.89 mg/dL

## 2022-05-26 LAB — POCT ACTIVATED CLOTTING TIME
Activated Clotting Time: 360 seconds
Activated Clotting Time: 305 seconds

## 2022-05-26 SURGERY — LEFT HEART CATH AND CORS/GRAFTS ANGIOGRAPHY
Anesthesia: LOCAL

## 2022-05-26 MED ORDER — TICAGRELOR 90 MG PO TABS
90.0000 mg | ORAL_TABLET | Freq: Two times a day (BID) | ORAL | Status: DC
  Administered 2022-05-27: 90 mg via ORAL
  Filled 2022-05-26: qty 1

## 2022-05-26 MED ORDER — IOHEXOL 350 MG/ML SOLN
INTRAVENOUS | Status: DC | PRN
  Administered 2022-05-26: 235 mL

## 2022-05-26 MED ORDER — LIDOCAINE HCL (PF) 1 % IJ SOLN
INTRAMUSCULAR | Status: DC | PRN
  Administered 2022-05-26: 2 mL

## 2022-05-26 MED ORDER — SODIUM CHLORIDE 0.9 % IV SOLN
INTRAVENOUS | Status: AC
  Administered 2022-05-26: 1000 mL via INTRAVENOUS

## 2022-05-26 MED ORDER — HEPARIN SODIUM (PORCINE) 1000 UNIT/ML IJ SOLN
INTRAMUSCULAR | Status: DC | PRN
  Administered 2022-05-26: 6000 [IU] via INTRAVENOUS
  Administered 2022-05-26: 2000 [IU] via INTRAVENOUS
  Administered 2022-05-26: 4500 [IU] via INTRAVENOUS

## 2022-05-26 MED ORDER — SODIUM CHLORIDE 0.9 % WEIGHT BASED INFUSION
1.0000 mL/kg/h | INTRAVENOUS | Status: DC
  Administered 2022-05-26: 1 mL/kg/h via INTRAVENOUS

## 2022-05-26 MED ORDER — SODIUM CHLORIDE 0.9% FLUSH
3.0000 mL | Freq: Two times a day (BID) | INTRAVENOUS | Status: DC
  Administered 2022-05-26: 3 mL via INTRAVENOUS

## 2022-05-26 MED ORDER — FENTANYL CITRATE (PF) 100 MCG/2ML IJ SOLN
INTRAMUSCULAR | Status: AC
  Filled 2022-05-26: qty 2

## 2022-05-26 MED ORDER — HEPARIN BOLUS VIA INFUSION
1000.0000 [IU] | Freq: Once | INTRAVENOUS | Status: DC
  Filled 2022-05-26: qty 1000

## 2022-05-26 MED ORDER — SODIUM CHLORIDE 0.9 % WEIGHT BASED INFUSION: 3.0000 mL/kg/h | INTRAVENOUS | Status: DC

## 2022-05-26 MED ORDER — SODIUM CHLORIDE 0.9 % IV SOLN: 250.0000 mL | INTRAVENOUS | Status: DC | PRN

## 2022-05-26 MED ORDER — VERAPAMIL HCL 2.5 MG/ML IV SOLN
INTRAVENOUS | Status: DC | PRN
  Administered 2022-05-26: 10 mL via INTRA_ARTERIAL

## 2022-05-26 MED ORDER — NITROGLYCERIN 1 MG/10 ML FOR IR/CATH LAB
INTRA_ARTERIAL | Status: DC | PRN
  Administered 2022-05-26 (×3): 200 ug via INTRACORONARY

## 2022-05-26 MED ORDER — TICAGRELOR 90 MG PO TABS
ORAL_TABLET | ORAL | Status: AC
  Filled 2022-05-26: qty 1

## 2022-05-26 MED ORDER — HEPARIN (PORCINE) IN NACL 1000-0.9 UT/500ML-% IV SOLN
INTRAVENOUS | Status: DC | PRN
  Administered 2022-05-26 (×2): 500 mL

## 2022-05-26 MED ORDER — LABETALOL HCL 5 MG/ML IV SOLN: 10.0000 mg | INTRAVENOUS | Status: AC | PRN

## 2022-05-26 MED ORDER — SODIUM CHLORIDE 0.9% FLUSH: 3.0000 mL | INTRAVENOUS | Status: DC | PRN

## 2022-05-26 MED ORDER — HEPARIN SODIUM (PORCINE) 1000 UNIT/ML IJ SOLN
INTRAMUSCULAR | Status: AC
  Filled 2022-05-26: qty 10

## 2022-05-26 MED ORDER — LIDOCAINE HCL (PF) 1 % IJ SOLN
INTRAMUSCULAR | Status: AC
  Filled 2022-05-26: qty 30

## 2022-05-26 MED ORDER — HYDRALAZINE HCL 20 MG/ML IJ SOLN: 10.0000 mg | INTRAMUSCULAR | Status: AC | PRN

## 2022-05-26 MED ORDER — TICAGRELOR 90 MG PO TABS
ORAL_TABLET | ORAL | Status: DC | PRN
  Administered 2022-05-26: 180 mg via ORAL

## 2022-05-26 MED ORDER — NITROGLYCERIN 1 MG/10 ML FOR IR/CATH LAB
INTRA_ARTERIAL | Status: AC
  Filled 2022-05-26: qty 10

## 2022-05-26 MED ORDER — VERAPAMIL HCL 2.5 MG/ML IV SOLN
INTRAVENOUS | Status: AC
  Filled 2022-05-26: qty 2

## 2022-05-26 MED ORDER — HEPARIN (PORCINE) IN NACL 1000-0.9 UT/500ML-% IV SOLN
INTRAVENOUS | Status: AC
  Filled 2022-05-26: qty 1000

## 2022-05-26 MED ORDER — MIDAZOLAM HCL 2 MG/2ML IJ SOLN
INTRAMUSCULAR | Status: AC
  Filled 2022-05-26: qty 2

## 2022-05-26 MED ORDER — MIDAZOLAM HCL 2 MG/2ML IJ SOLN
INTRAMUSCULAR | Status: DC | PRN
  Administered 2022-05-26: 2 mg via INTRAVENOUS

## 2022-05-26 MED ORDER — FENTANYL CITRATE (PF) 100 MCG/2ML IJ SOLN
INTRAMUSCULAR | Status: DC | PRN
Start: 2022-05-26 — End: 2022-05-26
  Administered 2022-05-26: 50 ug via INTRAVENOUS

## 2022-05-26 MED ORDER — ONDANSETRON HCL 4 MG/2ML IJ SOLN
4.0000 mg | Freq: Four times a day (QID) | INTRAMUSCULAR | Status: DC | PRN
  Filled 2022-05-26: qty 2

## 2022-05-26 MED ORDER — EZETIMIBE 10 MG PO TABS
10.0000 mg | ORAL_TABLET | Freq: Every day | ORAL | Status: DC
  Administered 2022-05-26 – 2022-05-27 (×2): 10 mg via ORAL
  Filled 2022-05-26 (×2): qty 1

## 2022-05-26 SURGICAL SUPPLY — 28 items
BALL SAPPHIRE NC24 2.50X12 (BALLOONS) ×2
BALL SAPPHIRE NC24 3.25X12 (BALLOONS) ×2
BALLN SAPPHIRE 2.0X12 (BALLOONS) ×2
BALLN SAPPHIRE 2.0X15 (BALLOONS) ×2
BALLOON SAPPHIRE 2.0X12 (BALLOONS) IMPLANT
BALLOON SAPPHIRE 2.0X15 (BALLOONS) IMPLANT
BALLOON SAPPHIRE NC24 2.50X12 (BALLOONS) IMPLANT
BALLOON SAPPHIRE NC24 3.25X12 (BALLOONS) IMPLANT
BAND CMPR LRG ZPHR (HEMOSTASIS) ×1
BAND ZEPHYR COMPRESS 30 LONG (HEMOSTASIS) ×1 IMPLANT
CATH 5FR JL3.5 JR4 ANG PIG MP (CATHETERS) ×1 IMPLANT
CATH VISTA GUIDE 6FR XBLAD3.5 (CATHETERS) ×1 IMPLANT
GLIDESHEATH SLEND SS 6F .021 (SHEATH) ×2 IMPLANT
GUIDEWIRE INQWIRE 1.5J.035X260 (WIRE) IMPLANT
INQWIRE 1.5J .035X260CM (WIRE) ×2
KIT ENCORE 26 ADVANTAGE (KITS) ×1 IMPLANT
KIT HEART LEFT (KITS) ×3 IMPLANT
PACK CARDIAC CATHETERIZATION (CUSTOM PROCEDURE TRAY) ×3 IMPLANT
STENT ONYX FRONTIER 2.25X18 (Permanent Stent) ×1 IMPLANT
STENT ONYX FRONTIER 3.0X12 (Permanent Stent) ×1 IMPLANT
STENT SYNERGY XD 2.25X12 (Permanent Stent) IMPLANT
STENT SYNERGY XD 2.25X16 (Permanent Stent) IMPLANT
SYNERGY XD 2.25X12 (Permanent Stent) ×2 IMPLANT
SYNERGY XD 2.25X16 (Permanent Stent) ×2 IMPLANT
TRANSDUCER W/STOPCOCK (MISCELLANEOUS) ×3 IMPLANT
TUBING CIL FLEX 10 FLL-RA (TUBING) ×3 IMPLANT
WIRE COUGAR XT STRL 190CM (WIRE) ×1 IMPLANT
WIRE HI TORQ WHISPER MS 190CM (WIRE) ×1 IMPLANT

## 2022-05-26 NOTE — Progress Notes (Signed)
  Transition of Care [facility]) Screening Note   Patient Details  Name: Jeffrey Allen Date of Birth: Jan 17, 1956   Transition [facility] Care [facility]) CM/SW Contact:    Ihor Gully, LCSW Phone Number: 05/26/2022, 1:37 PM    Transition of Care Department [facility]) has reviewed patient and no TOC needs have been identified at this time. We will continue to monitor patient advancement through interdisciplinary progression rounds. If new patient transition needs arise, please place a TOC consult.

## 2022-05-26 NOTE — H&P (View-Only) (Signed)
Cardiology Consultation:   Patient ID: GARNIE BORCHARDT MRN: [id number]; DOB: Jun 28, 1956  Admit date: 05/25/2022 Date of Consult: 05/26/2022  PCP:  Darfur Nation, MD   [facility] Providers Cardiologist:  Rozann Lesches, MD        Patient Profile:   Jeffrey Allen is a 66 y.o. male with a hx of HTN, HLD, prediabetes and prior CVA who is being seen 05/26/2022 for the evaluation of chest pain and elevated troponin values at the request of Dr. Arlyce Dice.  History of Present Illness:   Jeffrey Allen was last examined by myself in 01/2020 following a recent admission for COVID-19 and cardiology was consulted during admission given elevated troponin values up to 148. Echocardiogram showed a preserved EF of 55% with no regional wall motion abnormalities and was recommended to discuss ischemic testing as an outpatient once recovered from COVID-19. He denied any recent anginal symptoms at the time of his follow-up visit but given his risk factors and recently elevated enzymes, a Lexiscan Myoview was recommended for further evaluation but it does not appear that this was obtained. He has not been evaluated by Cardiology since.  He presented to [facility] ED on 05/25/2022 evaluation of chest tightness and dizziness. Reported having a syncopal episode the week prior and this occurred in the setting of possible sinusitis.In talking with patient and his wife today, he reports he was diagnosed with sinusitis on 5/21 and was started on antibiotic therapy at that time. He also had a tooth extraction later that week but denies any complications regarding this. Says he completed a course of antibiotic therapy and had noticed improvement in symptoms that week but over the past 4 to 5 days he has developed worsening dyspnea on exertion and fatigue. 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 He reports that over the past several months he has noticed worsening fatigue and also dyspnea on exertion but feels like symptoms have acutely progressed. Also reports intermittent episodes of left-sided chest pain which occur at rest or with activity and can last for minutes to hours at a time.  Says the pain does radiate into his right neck and shoulder at times. Notes dizziness as well and is unaware of any precipitating factors. No recent changes in medical therapy. Says he was intolerant to multiple statins in the past and has been taking Red Yeast Rice.  Initial labs showed WBC 9.0, Hgb 16.6, platelets 194, Na+ 139, K+ 3.5 and creatinine 0.85. Mg 2.0. Initial and repeat Hs Troponin at 54 and 50. EKG shows NSR, HR 66 with 1st degree AV block and frequent PVC's and couplets. CXR with no acute findings.    He has been started on ASA '81mg'$  daily and IV Heparin.    Past Medical History:  Diagnosis Date   GERD (gastroesophageal reflux disease)    Gout    Obesity (BMI 30.0-34.9)    Peptic ulcer disease    Prediabetes    Stroke [facility]) 06/2017    Past Surgical History:  Procedure Laterality Date   BIOPSY  11/25/2020   Procedure: BIOPSY;  Surgeon: Daneil Dolin, MD;  Location: [facility];  Service: Endoscopy;;  gastric   COLONOSCOPY     COLONOSCOPY N/A 12/07/2016   Scattered small and large-mouthed diverticula in sigmoid and  descending colon. Six semi-pedunculated polyps in sigmoid and ascending colon, 4-6 mm in size. Distal TI normal appearing. Hyperplastic and tubular adenomas. 3 year surveillance recommended.    COLONOSCOPY N/A 11/25/2020   diverticulosis in sigmoid and descending colon, three 3-4 mm polyps at rectosigmoid colon and hepatic flexure. Hyperplastic polyp. 5 year colonoscopy.    ESOPHAGOGASTRODUODENOSCOPY N/A 11/25/2020     normal esophagus, portal gastropathy, normal duodenum. Mild chronic gastritis, negative H.pylori.    POLYPECTOMY   11/25/2020   Procedure: POLYPECTOMY;  Surgeon: Daneil Dolin, MD;  Location: [facility];  Service: Endoscopy;;     Home Medications:  Prior to Admission medications   Medication Sig Start Date End Date Taking? Authorizing Provider  allopurinol (ZYLOPRIM) 300 MG tablet Take 0.5 tablets (150 mg total) by mouth daily. 05/18/21  Yes Gosrani, Nimish C, MD  Ascorbic Acid (VITAMIN C PO) Take by mouth daily.   Yes [provider]  aspirin EC 81 MG EC tablet Take 1 tablet (81 mg total) by mouth daily. 01/01/20  Yes Tat, Shanon Brow, MD  Cholecalciferol (VITAMIN D3) 125 MCG (5000 UT) TABS Take 10,000 Units by mouth daily.    Yes [provider]  gabapentin (NEURONTIN) 100 MG capsule TAKE 1 CAPSULE BY MOUTH THREE TIMES A DAY. Patient taking differently: daily. 09/22/21  Yes Lindell Spar, MD  Garlic 8676 MG CAPS Take 1,000 mg by mouth daily.   Yes [provider]  pantoprazole (PROTONIX) 40 MG tablet Take 1 tablet (40 mg total) by mouth daily. 07/14/21  Yes Gosrani, Nimish C, MD  Probiotic Product (PROBIOTIC-10 PO) Take 1 tablet by mouth daily. 18 thousand   Yes [provider]  Red Yeast Rice Extract (RED YEAST RICE PO) Take 2 tablets by mouth daily.   Yes [provider]  vitamin B-12 (CYANOCOBALAMIN) 1000 MCG tablet Take 1,000 mcg by mouth daily.   Yes [provider]  ipratropium (ATROVENT) 0.03 % nasal spray Place 2 sprays into both nostrils every 12 (twelve) hours. Patient not taking: Reported on 05/25/2022 05/16/22   Leath-Warren, Alda Lea, NP    Inpatient Medications: Scheduled Meds:  allopurinol  150 mg Oral Daily   aspirin EC  81 mg Oral Daily   ezetimibe  10 mg Oral Daily   gabapentin  100 mg Oral QHS   pantoprazole  40 mg Oral Daily   Continuous Infusions:  heparin 1,000 Units/hr (05/25/22 1805)   PRN Meds: acetaminophen **OR** acetaminophen, nitroGLYCERIN, ondansetron **OR** ondansetron (ZOFRAN) IV, polyethylene glycol  Allergies:     Allergies  Allergen Reactions   Prednisone Other (See Comments)    Peptic ulcers     Statins     Social History:   Social History   Socioeconomic History   Marital status: Married    Spouse name: Not on file   Number of children: Not on file   Years of education: Not on file   Highest education level: Not on file  Occupational History   Not on file  Tobacco Use   Smoking status: Former    Packs/day: 0.02    Years: 25.00    Pack years: 0.50    Types: Cigarettes    Quit date: 12/26/2006    Years since quitting: 15.4   Smokeless tobacco: Never  Vaping Use   Vaping Use: Never used  Substance and Sexual Activity   Alcohol use: Not Currently    Comment: beer twice per week, 2 at a time.    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   Social Determinants of Health   Financial Resource Strain: Not on file  Food Insecurity: Not on file  Transportation Needs: Not on file  Physical Activity: Not on file  Stress: Not on file  Social Connections: Not on file  Intimate Partner Violence: Not on file    Family History:    Family History  Problem Relation Age of Onset   Dementia Mother    Hypertension Father    Diabetes Father    COPD Father    Hyperlipidemia Father    Heart attack Father    Colon cancer Neg Hx      ROS:  Please see the history of present illness.   All other ROS reviewed and negative.     Physical Exam/Data:   Vitals:   05/25/22 1956 05/25/22 2359 05/26/22 0218 05/26/22 0500  BP: 136/88 (!) 148/84 128/76 134/89  Pulse: 84 65 (!) 58 63  Resp: '14 20 18 17  '$ Temp: 98 F (36.7 C) 97.7 F (36.5 C)  97.7 F (36.5 C)  TempSrc: Oral Oral    SpO2: 98% 98% 97% 97%  Weight: 88.8 kg     Height:        Intake/Output Summary (Last 24 hours) at 05/26/2022 0844 Last data filed at 05/26/2022 0200 Gross per 24 hour  Intake 1118.18 ml   Output --  Net 1118.18 ml      05/25/2022    7:56 PM 05/25/2022    3:26 PM 03/15/2022    8:36 AM  Last 3 Weights  Weight (lbs) 195 lb 12.3 oz 190 lb 192 lb 12.8 oz  Weight (kg) 88.8 kg 86.183 kg 87.454 kg     Body mass index is 30.66 kg/m.  General:  Well nourished, well developed, in no acute distress. HEENT: normal Neck: no JVD Vascular: No carotid bruits; Distal pulses 2+ bilaterally Cardiac:  normal S1, S2; RRR with frequent ectopic beats.  Lungs:  clear to auscultation bilaterally, no wheezing, rhonchi or rales  Abd: soft, nontender, no hepatomegaly  Ext: no edema Musculoskeletal:  No deformities, BUE and BLE strength normal and equal Skin: warm and dry  Neuro:  CNs 2-12 intact, no focal abnormalities noted Psych:  Normal affect   EKG:  The EKG was personally reviewed and demonstrates: NSR, HR 66 with 1st degree AV block and frequent PVC's and couplets.   Telemetry:  Telemetry was personally reviewed and demonstrates: Sinus bradycardia, heart rate in 40s to 50s with frequent PVCs and episodes of ventricular bigeminy. Pauses up to 2.2 seconds following PVCs  Relevant CV Studies:  Event Monitor: 06/2018 Sinus rhythm with PVCs sometimes occurring in couplets and triplets. No sustained arrhythmias.  Echocardiogram: 12/2019 IMPRESSIONS     1. Left ventricular ejection fraction, by visual estimation, is  approximately 55%. The left ventricle has normal function. There is  borderline left ventricular hypertrophy.   2. Left ventricular diastolic parameters are indeterminate.   3. The left ventricle has no regional wall motion abnormalities.   4. Global right ventricle has normal systolic function.The right  ventricular size is normal. No increase in right ventricular wall  thickness.   5. Left atrial size was mildly dilated.   6. Right atrial size was normal.   7. The mitral valve is grossly normal. Mild mitral valve regurgitation.   8.  The tricuspid valve is grossly  normal.   9. The aortic valve is tricuspid. Aortic valve regurgitation is not  visualized.  10. The pulmonic valve was grossly normal. Pulmonic valve regurgitation is  trivial.  11. TR signal is inadequate for assessing pulmonary artery systolic  pressure.  12. The inferior vena cava is normal in size with greater than 50%  respiratory variability, suggesting right atrial pressure of 3 mmHg.   Laboratory Data:  High Sensitivity Troponin:   Recent Labs  Lab 05/25/22 1603 05/25/22 1751 05/26/22 0130  TROPONINIHS 54* 50* 48*     Chemistry Recent Labs  Lab 05/24/22 0804 05/25/22 1603  NA 141 139  K 4.2 3.5  CL 103 110  CO2 24 24  GLUCOSE 91 123*  BUN 25 25*  CREATININE 0.98 0.85  CALCIUM 9.7 8.9  MG  --  2.0  GFRNONAA  --  >60  ANIONGAP  --  5    Recent Labs  Lab 05/24/22 0804  PROT 6.7  ALBUMIN 4.2  AST 19  ALT 19  ALKPHOS 67  BILITOT 0.4   Lipids  Recent Labs  Lab 05/24/22 0804  CHOL 242*  TRIG 127  HDL 48  LABVLDL 23  LDLCALC 171*  CHOLHDL 5.0    Hematology Recent Labs  Lab 05/24/22 0804 05/25/22 1603 05/26/22 0110  WBC 7.8 9.0 9.1  RBC 5.50 5.26 4.87  HGB 17.1 16.6 15.5  HCT 50.2 48.1 45.0  MCV 91 91.4 92.4  MCH 31.1 31.6 31.8  MCHC 34.1 34.5 34.4  RDW 12.0 11.9 12.1  PLT 201 194 166   Thyroid  Recent Labs  Lab 05/24/22 0804  TSH 1.230  FREET4 1.57    BNPNo results for input(s): BNP, PROBNP in the last 168 hours.  DDimer No results for input(s): DDIMER in the last 168 hours.   Radiology/Studies:  DG Chest Portable 1 View  Result Date: 05/25/2022 CLINICAL DATA:  Chest pain EXAM: PORTABLE CHEST 1 VIEW COMPARISON:  12/29/2019 FINDINGS: The heart size and mediastinal contours are within normal limits. No focal airspace consolidation, pleural effusion, or pneumothorax. IMPRESSION: No active disease. Electronically Signed   By: Davina Poke D.O.   On: 05/25/2022 16:06     Assessment and Plan:   1. Dyspnea on Exertion/Chest  Pain with Atypical Features/Elevated Troponin Values - He reports worsening fatigue and dyspnea on exertion for the past few months but symptoms have acutely worsened over the past 4 to 5 days and his wife notes that he was short of breath with walking from room to room in his house yesterday which is very abnormal for him. He also reports intermittent episodes of left-sided chest discomfort but this seems atypical for angina as pain can last for hours at a time and is not associated with exertion but does radiate into his right shoulder at times. - Initial and repeat Hs Troponin at 54 and 50. Will add-on repeat Hs Troponin for this AM. While he reports a history of "extra beats", he is having more frequent PVC's on telemetry and episodes of ventricular bigeminy. - Will ask for his echocardiogram to be obtained now to assess EF and wall motion. Given his progressive symptoms, he may require a cardiac catheterization for definitive evaluation and this was tentatively reviewed with the patient. Keep NPO for now. Continue IV Heparin and ASA. Will add Zetia given his statin intolerances. No BB given HR in the 40's to 50's.   2. HTN - Listed in his  past medical history but not on antihypertensive therapy prior to admission. BP was initially elevated at 148/84, improved to 134/89 on repeat check. Continue to follow this admission.  3. HLD - Recent FLP showed total cholesterol 242, triglycerides 127, HDL 48 and LDL 171.  He reports he was intolerant to multiple statins in the past secondary to myalgias and dizziness.  He has never tried Zetia, therefore will initiate Zetia 10 mg daily. If found to have coronary artery disease during work-up, he would benefit from referral to the [facility] as an outpatient. Would likely benefit anyway as goal LDL is less than 70 given his prior CVA.   4. PVC's/Ventricular Bigeminy - Noted on telemetry. Cannot start a BB given baseline HR in the 40's to 50's. Mg at 2.0 and K+  was at 3.5 on admission with supplementation ordered. Keep K+ ~ 4.0 and Mg ~ 2.0.  5. Dizziness/Recent Syncopal Episode - His brief syncopal episode occurred 2 weeks ago in the setting of sinusitis. He reports occasional dizziness but no recurrent syncope since. Continue to follow on telemetry. If work-up this admission is unrevealing, can consider a monitor as an outpatient.    Risk Assessment/Risk Scores:     HEAR Score (for undifferentiated chest pain):  HEAR Score: 4   For questions or updates, please contact [place] Please consult [email or web address] for contact info under    Signed, Erma Heritage, PA-C  05/26/2022 8:44 AM  Attending note Patient seen and disucssed with PA  Ahmed Prima, I agree with her documentation. 66 yo male history of HTN, HL, prior CVA, prediabetes, admitted with chest pain and DOE. Chest pain is left sided pressure 4-5/10. Can occur at rest or with activity, associated with significant SOB. Can last hours at a time, at times can be positional. Marked DOE with activities over the last few weeks. Denies any LE edema, orthopnea.     ER vitals: p 76 bp 111/95 96% RA K 3.5 BUN 25 Cr 0.85 WBC 9 Hgb 17.1 Plt 201 Mg 2 Trop 54-->50-->48 CXR no acute process EKG SR, inferior Qwaves, PVCs 05/2022 echo: LVE 50-55%, no WMAs, grade II dd Jan 2021 echo: LVE 55%, no WMAs   Patient presents with chest pain and progressing DOE. Mild trop 54 trending down, EKG with infrior Qwaves without acute ischemic findings though frequent PVCs. Echo LVE 50-55%, no WMAs. Multiple CAD risk factors with HTN, HL, prior tobacco use, prior CVA, prediabetes. Chest pain itself mixed in description, however with progression of DOE, mild trop elevation, frequent PVCs, and risk factors I think best option would be for definitive cath. Will plan for transfer to [place] to cardiology severe for cath today, if cannot be done today then tomorrow Medical therapy with ASA 81, zetia 10, hep gtt. If  confirmed CAD consider beta blocker, ARB. He is intolerant to statins. Lasts LDL 171 and started on zetia here, would plan for outpatient lipid clinic referal to consider [id number] given prior CVA needs aggressive lipid lowering.     Shared Decision Making/Informed Consent The risks [stroke (1 in 1000), death (1 in 1000), kidney failure [usually temporary] (1 in 500), bleeding (1 in 200), allergic reaction [possibly serious] (1 in 200)], benefits (diagnostic support and management of coronary artery disease) and alternatives of a cardiac catheterization were discussed in detail with Jeffrey Allen and he is willing to proceed.   Carlyle Dolly MD

## 2022-05-26 NOTE — Progress Notes (Signed)
   Called about patient have an episode of nausea and diaphoresis and then heart rates dropping to the 40s. Went to bedside to see patient. He reports he was eating dinner when he had a transient episode of shortness of breath which sounds like it is from the [place] and then he became very nausea, diaphoretic, and developed a brief severe headache that resolved after burping. Heart rate was noted to be in the low 40s at the time. BP 118/75. CBG 97. Symptoms did not last for long and heart rates quickly returned to the 60s. EKG showed normal sinus rhythm, rate 64 bpm, with multiple PVC but no acute ischemic changes. By the time I got to bedside, all of symptoms had resolved. He is resting comfortably in no acute distress. No adventitious heart or lung sounds. Reviewed telemetry which shows sinus rhythm with rates ranging in the 50s to 60s with intermittent rates in the 40s (does not sustain there for long) and fequent PVC sometimes in bigeminy pattern. This was noted during consult earlier today and looks stable. He is not on any rate control agents. Above episode sounds like a vasovagal event. Will continue to monitor for now. Recommended trying caffeine for the transient shortness of breath from the [place].  Darreld Mclean, PA-C 05/26/2022 6:58 PM

## 2022-05-26 NOTE — Interval H&P Note (Signed)
History and Physical Interval Note:  05/26/2022 2:38 PM  Jeffrey Allen  has presented today for surgery, with the diagnosis of chest pain.  The various methods of treatment have been discussed with the patient and family. After consideration of risks, benefits and other options for treatment, the patient has consented to  Procedure(s): LEFT HEART CATH AND CORS/GRAFTS ANGIOGRAPHY (N/A) as a surgical intervention.  The patient's history has been reviewed, patient examined, no change in status, stable for surgery.  I have reviewed the patient's chart and labs.  Questions were answered to the patient's satisfaction.    Cath Lab Visit (complete for each Cath Lab visit)  Clinical Evaluation Leading to the Procedure:   ACS: Yes.    Non-ACS:    Anginal Classification: CCS III  Anti-ischemic medical therapy: No Therapy  Non-Invasive Test Results: No non-invasive testing performed  Prior CABG: No previous CABG        [name]

## 2022-05-26 NOTE — Progress Notes (Signed)
ANTICOAGULATION CONSULT NOTE   Pharmacy Consult for heparin Indication: chest pain/ACS  Allergies  Allergen Reactions   Prednisone Other (See Comments)    Peptic ulcers     Statins     Patient Measurements: Height: '5\' 7"'$  (170.2 cm) Weight: 88.8 kg (195 lb 12.3 oz) IBW/kg (Calculated) : 66.1 Heparin Dosing Weight: 83.7 kg  Vital Signs: Temp: 97.7 F (36.5 C) (05/31 2359) Temp Source: Oral (05/31 2359) BP: 148/84 (05/31 2359) Pulse Rate: 65 (05/31 2359)  Labs: Recent Labs    05/24/22 0804 05/25/22 1603 05/25/22 1751 05/26/22 0110  HGB 17.1 16.6  --  15.5  HCT 50.2 48.1  --  45.0  PLT 201 194  --  166  APTT  --   --   --  61*  LABPROT  --   --  13.4  --   INR  --   --  1.0  --   HEPARINUNFRC  --   --   --  0.34  CREATININE 0.98 0.85  --   --   TROPONINIHS  --  54* 50*  --      Estimated Creatinine Clearance: 93.4 mL/min (by C-G formula based on SCr of 0.85 mg/dL).   Medical History: Past Medical History:  Diagnosis Date   GERD (gastroesophageal reflux disease)    Gout    Obesity (BMI 30.0-34.9)    Peptic ulcer disease    Prediabetes    Stroke ([place]) 06/2017    Medications:  Scheduled:   allopurinol  150 mg Oral Daily   aspirin EC  81 mg Oral Daily   gabapentin  100 mg Oral QHS   pantoprazole  40 mg Oral Daily   Infusions:   heparin 1,000 Units/hr (05/25/22 1805)    Assessment: Mr. Loveland is a 66 YO male presented to the ED with lightheadedness, dizizness, fatigue and weakness x10 days per MD note. Pharmacy consulted to dose heparin gtt for chest pain/ACS. Baseline CBC wnl. Will obtain stat INR for baseline.  6/1 AM update:  Heparin level therapeutic   Goal of Therapy:  Heparin level 0.3-0.7 units/ml Monitor platelets by anticoagulation protocol: Yes   Plan:  Cont heparin at 1000 units/hr  Confirmatory heparin level in 8 hours  Narda Bonds, PharmD, [place] Pharmacist Phone: [phone]

## 2022-05-26 NOTE — Progress Notes (Signed)
Called into the room to check pt. Noted to be pale and diaphoretic. C/o nausea and not feeling well. CBG checked at 97. BP 118/75. HR in the low 40's. EKG done. Had aslo c/o of severe headache for 30 seconds that disappeared with  burping. Sande Rives PA-c updated. PA-C in to see pt to evaluate

## 2022-05-26 NOTE — Progress Notes (Signed)
PROGRESS NOTE    PASTOR SGRO  MWU:[id number] DOB: Dec 11, 1956 DOA: 05/25/2022 PCP:  [place], MD   Brief Narrative:    Jeffrey Allen is a 66 y.o. male with medical history significant for CVA, hypertension, peptic ulcer disease, gout. Patient presented to the ED with complaints of chest pain/tightness of 10 days duration.  He has been admitted for evaluation of chest pain with elevated troponin that appears to be quite concerning.  Cardiology has evaluated patient with plans to transfer to cardiology service at [facility] and cardiac catheterization.  Assessment & Plan:   Principal Problem:   Chest pain Active Problems:   Gout   Essential hypertension   Prediabetes   Stroke ([place])  Assessment and Plan:   Chest pain with elevated troponin/atypical features -Continue n.p.o. status -Continue heparin drip -Continue aspirin and Zetia -Plan to transfer to [facility] for cardiac catheterization and will be under cardiology service -Monitor a.m. labs   Stroke [facility]) Without significant residual deficits. -Resume aspirin  PVCs/ventricular bigeminy -Aim to keep potassium 4.0 and magnesium 2.0 -Monitor on telemetry     Prediabetes HgbA1c 5.7%   Essential hypertension Stable.  Not on medication.   Gout Resume allopurinol.    DVT prophylaxis:Heparin drip Code Status: Full Family Communication: None at bedside Disposition Plan: Cardiac cath Status is: Observation The patient will require care spanning > 2 midnights and should be moved to inpatient because: Cardiac cath.  Consultants:  Cardiology  Procedures:  None  Antimicrobials:  None   Subjective: Patient seen and evaluated today with ongoing left-sided/substernal chest pain noted.  He denies any dyspnea, nausea, or vomiting.  No acute overnight events noted.  Objective: Vitals:   05/25/22 1956 05/25/22 2359 05/26/22 0218 05/26/22 0500  BP: 136/88 (!) 148/84 128/76 134/89  Pulse: 84 65 (!) 58  63  Resp: '14 20 18 17  '$ Temp: 98 F (36.7 C) 97.7 F (36.5 C)  97.7 F (36.5 C)  TempSrc: Oral Oral    SpO2: 98% 98% 97% 97%  Weight: 88.8 kg     Height:        Intake/Output Summary (Last 24 hours) at 05/26/2022 1029 Last data filed at 05/26/2022 0200 Gross per 24 hour  Intake 1118.18 ml  Output --  Net 1118.18 ml   Filed Weights   05/25/22 1526 05/25/22 1956  Weight: 86.2 kg 88.8 kg    Examination:  General exam: Appears calm and comfortable  Respiratory system: Clear to auscultation. Respiratory effort normal. Cardiovascular system: S1 & S2 heard, RRR.  Gastrointestinal system: Abdomen is soft Central nervous system: Alert and awake Extremities: No edema Skin: No significant lesions noted Psychiatry: Flat affect.    Data Reviewed: I have personally reviewed following labs and imaging studies  CBC: Recent Labs  Lab 05/24/22 0804 05/25/22 1603 05/26/22 0110  WBC 7.8 9.0 9.1  NEUTROABS 5.1  --   --   HGB 17.1 16.6 15.5  HCT 50.2 48.1 45.0  MCV 91 91.4 92.4  PLT 201 194 725   Basic Metabolic Panel: Recent Labs  Lab 05/24/22 0804 05/25/22 1603  NA 141 139  K 4.2 3.5  CL 103 110  CO2 24 24  GLUCOSE 91 123*  BUN 25 25*  CREATININE 0.98 0.85  CALCIUM 9.7 8.9  MG  --  2.0   GFR: Estimated Creatinine Clearance: 93.4 mL/min (by C-G formula based on SCr of 0.85 mg/dL). Liver Function Tests: Recent Labs  Lab 05/24/22 0804  AST 19  ALT 19  ALKPHOS 67  BILITOT 0.4  PROT 6.7  ALBUMIN 4.2   No results for input(s): LIPASE, AMYLASE in the last 168 hours. No results for input(s): AMMONIA in the last 168 hours. Coagulation Profile: Recent Labs  Lab 05/25/22 1751  INR 1.0   Cardiac Enzymes: No results for input(s): CKTOTAL, CKMB, CKMBINDEX, TROPONINI in the last 168 hours. BNP (last 3 results) No results for input(s): PROBNP in the last 8760 hours. HbA1C: Recent Labs    05/26/22 0110  HGBA1C 5.7*   CBG: No results for input(s): GLUCAP in the  last 168 hours. Lipid Profile: Recent Labs    05/24/22 0804  CHOL 242*  HDL 48  LDLCALC 171*  TRIG 127  CHOLHDL 5.0   Thyroid Function Tests: Recent Labs    05/24/22 0804  TSH 1.230  FREET4 1.57   Anemia Panel: No results for input(s): VITAMINB12, FOLATE, FERRITIN, TIBC, IRON, RETICCTPCT in the last 72 hours. Sepsis Labs: No results for input(s): PROCALCITON, LATICACIDVEN in the last 168 hours.  No results found for this or any previous visit (from the past 240 hour(s)).       Radiology Studies: DG Chest Portable 1 View  Result Date: 05/25/2022 CLINICAL DATA:  Chest pain EXAM: PORTABLE CHEST 1 VIEW COMPARISON:  12/29/2019 FINDINGS: The heart size and mediastinal contours are within normal limits. No focal airspace consolidation, pleural effusion, or pneumothorax. IMPRESSION: No active disease. Electronically Signed   By: Davina Poke D.O.   On: 05/25/2022 16:06   ECHOCARDIOGRAM COMPLETE  Result Date: 05/26/2022    ECHOCARDIOGRAM REPORT   Patient Name:   Jeffrey Allen Date of Exam: 05/26/2022 Medical Rec #:  [id number]       Height:       67.0 in Accession #:    [id number]      Weight:       195.8 lb Date of Birth:  Dec 31, 1955       BSA:          2.004 m Patient Age:    35 years        BP:           134/89 mmHg Patient Gender: M               HR:           50 bpm. Exam Location:  [facility] Procedure: 2D Echo, Cardiac Doppler and Color Doppler Indications:    Chest Pain  History:        Patient has prior history of Echocardiogram examinations, most                 recent 12/31/2019. Previous Myocardial Infarction, Stroke,                 Signs/Symptoms:Chest Pain; Risk Factors:Hypertension, Diabetes                 and Dyslipidemia.  Sonographer:    Wenda Low Referring Phys: [place]  1. Left ventricular ejection fraction, by estimation, is 50 to 55%. The left ventricle has low normal function. The left ventricle has no regional wall motion  abnormalities. Left ventricular diastolic parameters are consistent with Grade II diastolic dysfunction (pseudonormalization). Elevated left atrial pressure.  2. Right ventricular systolic function is normal. The right ventricular size is normal. Tricuspid regurgitation signal is inadequate for assessing PA pressure.  3. Left atrial size was moderately dilated.  4. The mitral valve is abnormal.  Mild mitral valve regurgitation. No evidence of mitral stenosis.  5. The aortic valve is tricuspid. Aortic valve regurgitation is trivial. No aortic stenosis is present.  6. Aortic dilatation noted. There is mild dilatation of the ascending aorta, measuring 36 mm.  7. The inferior vena cava is normal in size with greater than 50% respiratory variability, suggesting right atrial pressure of 3 mmHg. FINDINGS  Left Ventricle: Left ventricular ejection fraction, by estimation, is 50 to 55%. The left ventricle has low normal function. The left ventricle has no regional wall motion abnormalities. The left ventricular internal cavity size was normal in size. There is no left ventricular hypertrophy. Left ventricular diastolic parameters are consistent with Grade II diastolic dysfunction (pseudonormalization). Elevated left atrial pressure. Right Ventricle: The right ventricular size is normal. Right vetricular wall thickness was not well visualized. Right ventricular systolic function is normal. Tricuspid regurgitation signal is inadequate for assessing PA pressure. Left Atrium: Left atrial size was moderately dilated. Right Atrium: Right atrial size was normal in size. Pericardium: There is no evidence of pericardial effusion. Mitral Valve: The mitral valve is abnormal. Mild mitral valve regurgitation. No evidence of mitral valve stenosis. MV peak gradient, 3.1 mmHg. The mean mitral valve gradient is 1.0 mmHg. Tricuspid Valve: The tricuspid valve is normal in structure. Tricuspid valve regurgitation is not demonstrated. No evidence  of tricuspid stenosis. Aortic Valve: The aortic valve is tricuspid. Aortic valve regurgitation is trivial. No aortic stenosis is present. Aortic valve mean gradient measures 3.0 mmHg. Aortic valve peak gradient measures 6.0 mmHg. Aortic valve area, by VTI measures 2.38 cm. Pulmonic Valve: The pulmonic valve was not well visualized. Pulmonic valve regurgitation is not visualized. No evidence of pulmonic stenosis. Aorta: The aortic root is normal in size and structure and aortic dilatation noted. There is mild dilatation of the ascending aorta, measuring 36 mm. Venous: The inferior vena cava is normal in size with greater than 50% respiratory variability, suggesting right atrial pressure of 3 mmHg. IAS/Shunts: No atrial level shunt detected by color flow Doppler.  LEFT VENTRICLE PLAX 2D LVIDd:         5.50 cm     Diastology LVIDs:         4.00 cm     LV e' medial:    4.78 cm/s LV PW:         1.10 cm     LV E/e' medial:  17.3 LV IVS:        1.00 cm     LV e' lateral:   5.19 cm/s LVOT diam:     2.00 cm     LV E/e' lateral: 15.9 LV SV:         64 LV SV Index:   32 LVOT Area:     3.14 cm  LV Volumes (MOD) LV vol d, MOD A2C: 72.3 ml LV vol d, MOD A4C: 84.7 ml LV vol s, MOD A2C: 32.4 ml LV vol s, MOD A4C: 49.6 ml LV SV MOD A2C:     39.9 ml LV SV MOD A4C:     84.7 ml LV SV MOD BP:      40.7 ml RIGHT VENTRICLE RV Basal diam:  3.35 cm RV Mid diam:    3.30 cm RV S prime:     13.90 cm/s TAPSE (M-mode): 2.0 cm LEFT ATRIUM             Index        RIGHT ATRIUM  Index LA diam:        4.90 cm 2.45 cm/m   RA Area:     18.90 cm LA Vol (A2C):   87.2 ml 43.51 ml/m  RA Volume:   50.50 ml  25.20 ml/m LA Vol (A4C):   93.3 ml 46.56 ml/m LA Biplane Vol: 92.1 ml 45.96 ml/m  AORTIC VALVE                    PULMONIC VALVE AV Area (Vmax):    2.44 cm     PV Vmax:       0.84 m/s AV Area (Vmean):   2.16 cm     PV Peak grad:  2.8 mmHg AV Area (VTI):     2.38 cm AV Vmax:           122.00 cm/s AV Vmean:          79.100 cm/s AV VTI:             0.270 m AV Peak Grad:      6.0 mmHg AV Mean Grad:      3.0 mmHg LVOT Vmax:         94.90 cm/s LVOT Vmean:        54.350 cm/s LVOT VTI:          0.205 m LVOT/AV VTI ratio: 0.76  AORTA Ao Root diam: 2.90 cm Ao Asc diam:  3.60 cm MITRAL VALVE MV Area (PHT): 2.22 cm    SHUNTS MV Area VTI:   1.83 cm    Systemic VTI:  0.20 m MV Peak grad:  3.1 mmHg    Systemic Diam: 2.00 cm MV Mean grad:  1.0 mmHg MV Vmax:       0.88 m/s MV Vmean:      54.5 cm/s MV Decel Time: 341 msec MV E velocity: 82.70 cm/s MV A velocity: 42.80 cm/s MV E/A ratio:  1.93 Carlyle Dolly MD Electronically signed by Carlyle Dolly MD Signature Date/Time: 05/26/2022/9:48:51 AM    Final         Scheduled Meds:  allopurinol  150 mg Oral Daily   aspirin EC  81 mg Oral Daily   ezetimibe  10 mg Oral Daily   gabapentin  100 mg Oral QHS   pantoprazole  40 mg Oral Daily   Continuous Infusions:  sodium chloride     Followed by   sodium chloride     heparin 1,000 Units/hr (05/25/22 1805)     LOS: 0 days    Time spent: 35 minutes    Ronak Duquette Darleen Crocker, DO [facility]  If 7PM-7AM, please contact night-coverage [email or web address] 05/26/2022, 10:29 AM

## 2022-05-26 NOTE — Progress Notes (Signed)
*  PRELIMINARY RESULTS* Echocardiogram 2D Echocardiogram has been performed.  Jeffrey Allen 05/26/2022, 9:39 AM

## 2022-05-26 NOTE — Progress Notes (Signed)
ANTICOAGULATION CONSULT NOTE   Pharmacy Consult for heparin Indication: chest pain/ACS  Allergies  Allergen Reactions   Prednisone Other (See Comments)    Peptic ulcers     Statins     Patient Measurements: Height: '5\' 7"'$  (170.2 cm) Weight: 88.8 kg (195 lb 12.3 oz) IBW/kg (Calculated) : 66.1 Heparin Dosing Weight: 83.7 kg  Vital Signs: Temp: 97.7 F (36.5 C) (06/01 0500) BP: 134/89 (06/01 0500) Pulse Rate: 63 (06/01 0500)  Labs: Recent Labs    05/24/22 0804 05/25/22 1603 05/25/22 1751 05/26/22 0110 05/26/22 0130 05/26/22 0946  HGB 17.1 16.6  --  15.5  --   --   HCT 50.2 48.1  --  45.0  --   --   PLT 201 194  --  166  --   --   APTT  --   --   --  61*  --   --   LABPROT  --   --  13.4  --   --   --   INR  --   --  1.0  --   --   --   HEPARINUNFRC  --   --   --  0.34  --  0.23*  CREATININE 0.98 0.85  --   --   --   --   TROPONINIHS  --  54* 50*  --  48*  --      Estimated Creatinine Clearance: 93.4 mL/min (by C-G formula based on SCr of 0.85 mg/dL).   Medical History: Past Medical History:  Diagnosis Date   GERD (gastroesophageal reflux disease)    Gout    Obesity (BMI 30.0-34.9)    Peptic ulcer disease    Prediabetes    Stroke ([place]) 06/2017    Medications:  Scheduled:   allopurinol  150 mg Oral Daily   aspirin EC  81 mg Oral Daily   ezetimibe  10 mg Oral Daily   gabapentin  100 mg Oral QHS   pantoprazole  40 mg Oral Daily   Infusions:   sodium chloride 1 mL/kg/hr (05/26/22 1132)   heparin 1,000 Units/hr (05/25/22 1805)    Assessment: Jeffrey Allen is a 66 YO male presented to the ED with lightheadedness, dizizness, fatigue and weakness x10 days per MD note. Pharmacy consulted to dose heparin gtt for chest pain/ACS. Baseline CBC wnl.   HL 0.23- subtherapeutic  Trop 50 > 48  Goal of Therapy:  Heparin level 0.3-0.7 units/ml Monitor platelets by anticoagulation protocol: Yes   Plan:  Rebolus 1000 units x 1 Increase heparin infusion to 1150  units/hr. Confirmatory heparin level in 8 hours  Margot Ables, PharmD Clinical Pharmacist 05/26/2022 1:16 PM

## 2022-05-26 NOTE — Consult Note (Addendum)
Cardiology Consultation:   Patient ID: Jeffrey Allen MRN: [id number]; DOB: 06-14-1956  Admit date: 05/25/2022 Date of Consult: 05/26/2022  PCP:  Tacoma Nation, MD   [facility] Providers Cardiologist:  Rozann Lesches, MD        Patient Profile:   Jeffrey Allen is a 66 y.o. male with a hx of HTN, HLD, prediabetes and prior CVA who is being seen 05/26/2022 for the evaluation of chest pain and elevated troponin values at the request of Dr. Arlyce Dice.  History of Present Illness:   Jeffrey Allen was last examined by myself in 01/2020 following a recent admission for COVID-19 and cardiology was consulted during admission given elevated troponin values up to 148. Echocardiogram showed a preserved EF of 55% with no regional wall motion abnormalities and was recommended to discuss ischemic testing as an outpatient once recovered from COVID-19. He denied any recent anginal symptoms at the time of his follow-up visit but given his risk factors and recently elevated enzymes, a Lexiscan Myoview was recommended for further evaluation but it does not appear that this was obtained. He has not been evaluated by Cardiology since.  He presented to [facility] ED on 05/25/2022 evaluation of chest tightness and dizziness. Reported having a syncopal episode the week prior and this occurred in the setting of possible sinusitis.In talking with patient and his wife today, he reports he was diagnosed with sinusitis on 5/21 and was started on antibiotic therapy at that time. He also had a tooth extraction later that week but denies any complications regarding this. Says he completed a course of antibiotic therapy and had noticed improvement in symptoms that week but over the past 4 to 5 days he has developed worsening dyspnea on exertion and fatigue. 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 He reports that over the past several months he has noticed worsening fatigue and also dyspnea on exertion but feels like symptoms have acutely progressed. Also reports intermittent episodes of left-sided chest pain which occur at rest or with activity and can last for minutes to hours at a time.  Says the pain does radiate into his right neck and shoulder at times. Notes dizziness as well and is unaware of any precipitating factors. No recent changes in medical therapy. Says he was intolerant to multiple statins in the past and has been taking Red Yeast Rice.  Initial labs showed WBC 9.0, Hgb 16.6, platelets 194, Na+ 139, K+ 3.5 and creatinine 0.85. Mg 2.0. Initial and repeat Hs Troponin at 54 and 50. EKG shows NSR, HR 66 with 1st degree AV block and frequent PVC's and couplets. CXR with no acute findings.    He has been started on ASA '81mg'$  daily and IV Heparin.    Past Medical History:  Diagnosis Date   GERD (gastroesophageal reflux disease)    Gout    Obesity (BMI 30.0-34.9)    Peptic ulcer disease    Prediabetes    Stroke [facility]) 06/2017    Past Surgical History:  Procedure Laterality Date   BIOPSY  11/25/2020   Procedure: BIOPSY;  Surgeon: Daneil Dolin, MD;  Location: [facility];  Service: Endoscopy;;  gastric   COLONOSCOPY     COLONOSCOPY N/A 12/07/2016   Scattered small and large-mouthed diverticula in sigmoid and  descending colon. Six semi-pedunculated polyps in sigmoid and ascending colon, 4-6 mm in size. Distal TI normal appearing. Hyperplastic and tubular adenomas. 3 year surveillance recommended.    COLONOSCOPY N/A 11/25/2020   diverticulosis in sigmoid and descending colon, three 3-4 mm polyps at rectosigmoid colon and hepatic flexure. Hyperplastic polyp. 5 year colonoscopy.    ESOPHAGOGASTRODUODENOSCOPY N/A 11/25/2020     normal esophagus, portal gastropathy, normal duodenum. Mild chronic gastritis, negative H.pylori.    POLYPECTOMY   11/25/2020   Procedure: POLYPECTOMY;  Surgeon: Daneil Dolin, MD;  Location: [facility];  Service: Endoscopy;;     Home Medications:  Prior to Admission medications   Medication Sig Start Date End Date Taking? Authorizing Provider  allopurinol (ZYLOPRIM) 300 MG tablet Take 0.5 tablets (150 mg total) by mouth daily. 05/18/21  Yes Gosrani, Nimish C, MD  Ascorbic Acid (VITAMIN C PO) Take by mouth daily.   Yes [provider]  aspirin EC 81 MG EC tablet Take 1 tablet (81 mg total) by mouth daily. 01/01/20  Yes Tat, Shanon Brow, MD  Cholecalciferol (VITAMIN D3) 125 MCG (5000 UT) TABS Take 10,000 Units by mouth daily.    Yes [provider]  gabapentin (NEURONTIN) 100 MG capsule TAKE 1 CAPSULE BY MOUTH THREE TIMES A DAY. Patient taking differently: daily. 09/22/21  Yes Lindell Spar, MD  Garlic 5681 MG CAPS Take 1,000 mg by mouth daily.   Yes [provider]  pantoprazole (PROTONIX) 40 MG tablet Take 1 tablet (40 mg total) by mouth daily. 07/14/21  Yes Gosrani, Nimish C, MD  Probiotic Product (PROBIOTIC-10 PO) Take 1 tablet by mouth daily. 18 thousand   Yes [provider]  Red Yeast Rice Extract (RED YEAST RICE PO) Take 2 tablets by mouth daily.   Yes [provider]  vitamin B-12 (CYANOCOBALAMIN) 1000 MCG tablet Take 1,000 mcg by mouth daily.   Yes [provider]  ipratropium (ATROVENT) 0.03 % nasal spray Place 2 sprays into both nostrils every 12 (twelve) hours. Patient not taking: Reported on 05/25/2022 05/16/22   Leath-Warren, Alda Lea, NP    Inpatient Medications: Scheduled Meds:  allopurinol  150 mg Oral Daily   aspirin EC  81 mg Oral Daily   ezetimibe  10 mg Oral Daily   gabapentin  100 mg Oral QHS   pantoprazole  40 mg Oral Daily   Continuous Infusions:  heparin 1,000 Units/hr (05/25/22 1805)   PRN Meds: acetaminophen **OR** acetaminophen, nitroGLYCERIN, ondansetron **OR** ondansetron (ZOFRAN) IV, polyethylene glycol  Allergies:     Allergies  Allergen Reactions   Prednisone Other (See Comments)    Peptic ulcers     Statins     Social History:   Social History   Socioeconomic History   Marital status: Married    Spouse name: Not on file   Number of children: Not on file   Years of education: Not on file   Highest education level: Not on file  Occupational History   Not on file  Tobacco Use   Smoking status: Former    Packs/day: 0.02    Years: 25.00    Pack years: 0.50    Types: Cigarettes    Quit date: 12/26/2006    Years since quitting: 15.4   Smokeless tobacco: Never  Vaping Use   Vaping Use: Never used  Substance and Sexual Activity   Alcohol use: Not Currently    Comment: beer twice per week, 2 at a time.    Drug use:  No   Sexual activity: Yes  Other Topics Concern   Not on file  Social History Narrative   Married 44 years.Owns 2 farms-tobacco .Retired age 44 yrs.   Social Determinants of Health   Financial Resource Strain: Not on file  Food Insecurity: Not on file  Transportation Needs: Not on file  Physical Activity: Not on file  Stress: Not on file  Social Connections: Not on file  Intimate Partner Violence: Not on file    Family History:    Family History  Problem Relation Age of Onset   Dementia Mother    Hypertension Father    Diabetes Father    COPD Father    Hyperlipidemia Father    Heart attack Father    Colon cancer Neg Hx      ROS:  Please see the history of present illness.   All other ROS reviewed and negative.     Physical Exam/Data:   Vitals:   05/25/22 1956 05/25/22 2359 05/26/22 0218 05/26/22 0500  BP: 136/88 (!) 148/84 128/76 134/89  Pulse: 84 65 (!) 58 63  Resp: '14 20 18 17  '$ Temp: 98 F (36.7 C) 97.7 F (36.5 C)  97.7 F (36.5 C)  TempSrc: Oral Oral    SpO2: 98% 98% 97% 97%  Weight: 88.8 kg     Height:        Intake/Output Summary (Last 24 hours) at 05/26/2022 0844 Last data filed at 05/26/2022 0200 Gross per 24 hour  Intake 1118.18 ml   Output --  Net 1118.18 ml      05/25/2022    7:56 PM 05/25/2022    3:26 PM 03/15/2022    8:36 AM  Last 3 Weights  Weight (lbs) 195 lb 12.3 oz 190 lb 192 lb 12.8 oz  Weight (kg) 88.8 kg 86.183 kg 87.454 kg     Body mass index is 30.66 kg/m.  General:  Well nourished, well developed, in no acute distress. HEENT: normal Neck: no JVD Vascular: No carotid bruits; Distal pulses 2+ bilaterally Cardiac:  normal S1, S2; RRR with frequent ectopic beats.  Lungs:  clear to auscultation bilaterally, no wheezing, rhonchi or rales  Abd: soft, nontender, no hepatomegaly  Ext: no edema Musculoskeletal:  No deformities, BUE and BLE strength normal and equal Skin: warm and dry  Neuro:  CNs 2-12 intact, no focal abnormalities noted Psych:  Normal affect   EKG:  The EKG was personally reviewed and demonstrates: NSR, HR 66 with 1st degree AV block and frequent PVC's and couplets.   Telemetry:  Telemetry was personally reviewed and demonstrates: Sinus bradycardia, heart rate in 40s to 50s with frequent PVCs and episodes of ventricular bigeminy. Pauses up to 2.2 seconds following PVCs  Relevant CV Studies:  Event Monitor: 06/2018 Sinus rhythm with PVCs sometimes occurring in couplets and triplets. No sustained arrhythmias.  Echocardiogram: 12/2019 IMPRESSIONS     1. Left ventricular ejection fraction, by visual estimation, is  approximately 55%. The left ventricle has normal function. There is  borderline left ventricular hypertrophy.   2. Left ventricular diastolic parameters are indeterminate.   3. The left ventricle has no regional wall motion abnormalities.   4. Global right ventricle has normal systolic function.The right  ventricular size is normal. No increase in right ventricular wall  thickness.   5. Left atrial size was mildly dilated.   6. Right atrial size was normal.   7. The mitral valve is grossly normal. Mild mitral valve regurgitation.   8.  The tricuspid valve is grossly  normal.   9. The aortic valve is tricuspid. Aortic valve regurgitation is not  visualized.  10. The pulmonic valve was grossly normal. Pulmonic valve regurgitation is  trivial.  11. TR signal is inadequate for assessing pulmonary artery systolic  pressure.  12. The inferior vena cava is normal in size with greater than 50%  respiratory variability, suggesting right atrial pressure of 3 mmHg.   Laboratory Data:  High Sensitivity Troponin:   Recent Labs  Lab 05/25/22 1603 05/25/22 1751 05/26/22 0130  TROPONINIHS 54* 50* 48*     Chemistry Recent Labs  Lab 05/24/22 0804 05/25/22 1603  NA 141 139  K 4.2 3.5  CL 103 110  CO2 24 24  GLUCOSE 91 123*  BUN 25 25*  CREATININE 0.98 0.85  CALCIUM 9.7 8.9  MG  --  2.0  GFRNONAA  --  >60  ANIONGAP  --  5    Recent Labs  Lab 05/24/22 0804  PROT 6.7  ALBUMIN 4.2  AST 19  ALT 19  ALKPHOS 67  BILITOT 0.4   Lipids  Recent Labs  Lab 05/24/22 0804  CHOL 242*  TRIG 127  HDL 48  LABVLDL 23  LDLCALC 171*  CHOLHDL 5.0    Hematology Recent Labs  Lab 05/24/22 0804 05/25/22 1603 05/26/22 0110  WBC 7.8 9.0 9.1  RBC 5.50 5.26 4.87  HGB 17.1 16.6 15.5  HCT 50.2 48.1 45.0  MCV 91 91.4 92.4  MCH 31.1 31.6 31.8  MCHC 34.1 34.5 34.4  RDW 12.0 11.9 12.1  PLT 201 194 166   Thyroid  Recent Labs  Lab 05/24/22 0804  TSH 1.230  FREET4 1.57    BNPNo results for input(s): BNP, PROBNP in the last 168 hours.  DDimer No results for input(s): DDIMER in the last 168 hours.   Radiology/Studies:  DG Chest Portable 1 View  Result Date: 05/25/2022 CLINICAL DATA:  Chest pain EXAM: PORTABLE CHEST 1 VIEW COMPARISON:  12/29/2019 FINDINGS: The heart size and mediastinal contours are within normal limits. No focal airspace consolidation, pleural effusion, or pneumothorax. IMPRESSION: No active disease. Electronically Signed   By: Davina Poke D.O.   On: 05/25/2022 16:06     Assessment and Plan:   1. Dyspnea on Exertion/Chest  Pain with Atypical Features/Elevated Troponin Values - He reports worsening fatigue and dyspnea on exertion for the past few months but symptoms have acutely worsened over the past 4 to 5 days and his wife notes that he was short of breath with walking from room to room in his house yesterday which is very abnormal for him. He also reports intermittent episodes of left-sided chest discomfort but this seems atypical for angina as pain can last for hours at a time and is not associated with exertion but does radiate into his right shoulder at times. - Initial and repeat Hs Troponin at 54 and 50. Will add-on repeat Hs Troponin for this AM. While he reports a history of "extra beats", he is having more frequent PVC's on telemetry and episodes of ventricular bigeminy. - Will ask for his echocardiogram to be obtained now to assess EF and wall motion. Given his progressive symptoms, he may require a cardiac catheterization for definitive evaluation and this was tentatively reviewed with the patient. Keep NPO for now. Continue IV Heparin and ASA. Will add Zetia given his statin intolerances. No BB given HR in the 40's to 50's.   2. HTN - Listed in his  past medical history but not on antihypertensive therapy prior to admission. BP was initially elevated at 148/84, improved to 134/89 on repeat check. Continue to follow this admission.  3. HLD - Recent FLP showed total cholesterol 242, triglycerides 127, HDL 48 and LDL 171.  He reports he was intolerant to multiple statins in the past secondary to myalgias and dizziness.  He has never tried Zetia, therefore will initiate Zetia 10 mg daily. If found to have coronary artery disease during work-up, he would benefit from referral to the [facility] as an outpatient. Would likely benefit anyway as goal LDL is less than 70 given his prior CVA.   4. PVC's/Ventricular Bigeminy - Noted on telemetry. Cannot start a BB given baseline HR in the 40's to 50's. Mg at 2.0 and K+  was at 3.5 on admission with supplementation ordered. Keep K+ ~ 4.0 and Mg ~ 2.0.  5. Dizziness/Recent Syncopal Episode - His brief syncopal episode occurred 2 weeks ago in the setting of sinusitis. He reports occasional dizziness but no recurrent syncope since. Continue to follow on telemetry. If work-up this admission is unrevealing, can consider a monitor as an outpatient.    Risk Assessment/Risk Scores:     HEAR Score (for undifferentiated chest pain):  HEAR Score: 4   For questions or updates, please contact [place] Please consult [email or web address] for contact info under    Signed, Erma Heritage, PA-C  05/26/2022 8:44 AM  Attending note Patient seen and disucssed with PA  Ahmed Prima, I agree with her documentation. 66 yo male history of HTN, HL, prior CVA, prediabetes, admitted with chest pain and DOE. Chest pain is left sided pressure 4-5/10. Can occur at rest or with activity, associated with significant SOB. Can last hours at a time, at times can be positional. Marked DOE with activities over the last few weeks. Denies any LE edema, orthopnea.     ER vitals: p 76 bp 111/95 96% RA K 3.5 BUN 25 Cr 0.85 WBC 9 Hgb 17.1 Plt 201 Mg 2 Trop 54-->50-->48 CXR no acute process EKG SR, inferior Qwaves, PVCs 05/2022 echo: LVE 50-55%, no WMAs, grade II dd Jan 2021 echo: LVE 55%, no WMAs   Patient presents with chest pain and progressing DOE. Mild trop 54 trending down, EKG with infrior Qwaves without acute ischemic findings though frequent PVCs. Echo LVE 50-55%, no WMAs. Multiple CAD risk factors with HTN, HL, prior tobacco use, prior CVA, prediabetes. Chest pain itself mixed in description, however with progression of DOE, mild trop elevation, frequent PVCs, and risk factors I think best option would be for definitive cath. Will plan for transfer to [facility] to cardiology severe for cath today, if cannot be done today then tomorrow Medical therapy with ASA 81, zetia 10, hep gtt. If  confirmed CAD consider beta blocker, ARB. He is intolerant to statins. Lasts LDL 171 and started on zetia here, would plan for outpatient lipid clinic referal to consider [id number] given prior CVA needs aggressive lipid lowering.     Shared Decision Making/Informed Consent The risks [stroke (1 in 1000), death (1 in 1000), kidney failure [usually temporary] (1 in 500), bleeding (1 in 200), allergic reaction [possibly serious] (1 in 200)], benefits (diagnostic support and management of coronary artery disease) and alternatives of a cardiac catheterization were discussed in detail with Mr. Pomplun and he is willing to proceed.   Carlyle Dolly MD

## 2022-05-26 NOTE — Progress Notes (Signed)
Received a call from [place] at [facility] that pt had 2.1 second pause on the monitor and that his HR dropped to 30s. VS taken, and on call provider notified.

## 2022-05-26 NOTE — Progress Notes (Signed)
Received a call from [place]  at [phone] that pt HR dropped to 35. VS taken, Pt HR was 60, Pt was asymptomatic upon assessment. On call provider notified who stated to continue to monitor the pt.

## 2022-05-27 ENCOUNTER — Encounter (HOSPITAL_COMMUNITY): Payer: Self-pay | Admitting: Cardiovascular Disease

## 2022-05-27 ENCOUNTER — Other Ambulatory Visit: Payer: Self-pay | Admitting: Cardiology

## 2022-05-27 ENCOUNTER — Other Ambulatory Visit (HOSPITAL_COMMUNITY): Payer: Self-pay

## 2022-05-27 ENCOUNTER — Observation Stay (HOSPITAL_BASED_OUTPATIENT_CLINIC_OR_DEPARTMENT_OTHER)
Admit: 2022-05-27 | Discharge: 2022-05-27 | Disposition: A | Discharge status: Home / Self Care | Payer: 59 | Attending: Cardiology | Admitting: Cardiology

## 2022-05-27 DIAGNOSIS — I110 Hypertensive heart disease with heart failure: Secondary | ICD-10-CM | POA: Diagnosis not present

## 2022-05-27 DIAGNOSIS — I493 Ventricular premature depolarization: Secondary | ICD-10-CM

## 2022-05-27 DIAGNOSIS — R55 Syncope and collapse: Secondary | ICD-10-CM | POA: Diagnosis not present

## 2022-05-27 DIAGNOSIS — Z87891 Personal history of nicotine dependence: Secondary | ICD-10-CM | POA: Diagnosis not present

## 2022-05-27 DIAGNOSIS — Z7982 Long term (current) use of aspirin: Secondary | ICD-10-CM | POA: Diagnosis not present

## 2022-05-27 DIAGNOSIS — Z8673 Personal history of transient ischemic attack (TIA), and cerebral infarction without residual deficits: Secondary | ICD-10-CM | POA: Diagnosis not present

## 2022-05-27 DIAGNOSIS — I25110 Atherosclerotic heart disease of native coronary artery with unstable angina pectoris: Secondary | ICD-10-CM | POA: Diagnosis not present

## 2022-05-27 DIAGNOSIS — I5030 Unspecified diastolic (congestive) heart failure: Secondary | ICD-10-CM | POA: Diagnosis not present

## 2022-05-27 DIAGNOSIS — Z79899 Other long term (current) drug therapy: Secondary | ICD-10-CM | POA: Diagnosis not present

## 2022-05-27 DIAGNOSIS — R079 Chest pain, unspecified: Secondary | ICD-10-CM | POA: Diagnosis not present

## 2022-05-27 LAB — BASIC METABOLIC PANEL
Anion gap: 8 (ref 5–15)
BUN: 15 mg/dL (ref 8–23)
CO2: 23 mmol/L (ref 22–32)
Calcium: 9.1 mg/dL (ref 8.9–10.3)
Chloride: 107 mmol/L (ref 98–111)
Creatinine, Ser: 0.96 mg/dL (ref 0.61–1.24)
GFR, Estimated: >60 mL/min (ref >60)
Glucose, Bld: 88 mg/dL (ref 70–99)
Potassium: 3.6 mmol/L (ref 3.5–5.1)
Sodium: 138 mmol/L (ref 135–145)

## 2022-05-27 LAB — CBC
HCT: 47.7 % (ref 39.0–52.0)
Hemoglobin: 16.4 g/dL (ref 13.0–17.0)
MCH: 31.2 pg (ref 26.0–34.0)
MCHC: 34.4 g/dL (ref 30.0–36.0)
MCV: 90.7 fL (ref 80.0–100.0)
Platelets: 162 10*3/uL (ref 150–400)
RBC: 5.26 MIL/uL (ref 4.22–5.81)
RDW: 11.9 % (ref 11.5–15.5)
WBC: 9.3 10*3/uL (ref 4.0–10.5)
nRBC: 0 % (ref 0.0–0.2)

## 2022-05-27 LAB — MAGNESIUM: Magnesium: 2 mg/dL (ref 1.7–2.4)

## 2022-05-27 MED ORDER — POTASSIUM CHLORIDE CRYS ER 20 MEQ PO TBCR
40.0000 meq | EXTENDED_RELEASE_TABLET | Freq: Once | ORAL | Status: AC
  Administered 2022-05-27: 40 meq via ORAL
  Filled 2022-05-27: qty 2

## 2022-05-27 MED ORDER — NITROGLYCERIN 0.4 MG SL SUBL
0.4000 mg | SUBLINGUAL_TABLET | SUBLINGUAL | 2 refills | Status: AC | PRN
  Filled 2022-05-27: qty 25, 14d supply, fill #0

## 2022-05-27 MED ORDER — EZETIMIBE 10 MG PO TABS
10.0000 mg | ORAL_TABLET | Freq: Every day | ORAL | 1 refills | Status: DC
  Filled 2022-05-27: qty 30, 30d supply, fill #0

## 2022-05-27 MED ORDER — TICAGRELOR 90 MG PO TABS
90.0000 mg | ORAL_TABLET | Freq: Two times a day (BID) | ORAL | 2 refills | Status: DC
  Filled 2022-05-27: qty 60, 30d supply, fill #0

## 2022-05-27 NOTE — Discharge Summary (Addendum)
Discharge Summary    Patient ID: Jeffrey Allen MRN: [id number]; DOB: Jan 12, 1956  Admit date: 05/25/2022 Discharge date: 05/27/2022  PCP:  Powhatan Nation, MD   [facility] Providers Cardiologist:  Rozann Lesches, MD     Discharge Diagnoses    Principal Problem:   Chest pain Active Problems:   Gout   Essential hypertension   Prediabetes   Stroke [facility])   Unstable angina (HCC)   PVC (premature ventricular contraction)   Diagnostic Studies/Procedures    Cath: 05/26/22   Ost RCA to Prox RCA lesion is 50% stenosed.   Mid Cx lesion is 99% stenosed.   Prox Cx lesion is 70% stenosed.   1st Diag lesion is 80% stenosed.   Dist LAD lesion is 90% stenosed.   A drug-eluting stent was successfully placed using a STENT ONYX FRONTIER 2.25X18.   A drug-eluting stent was successfully placed using a STENT ONYX FRONTIER 3.0X12.   A drug-eluting stent was successfully placed using a SYNERGY XD 2.25X16.   A drug-eluting stent was successfully placed using a SYNERGY XD 2.25X12.   Post intervention, there is a 0% residual stenosis.   Post intervention, there is a 0% residual stenosis.   Post intervention, there is a 0% residual stenosis.   Post intervention, there is a 0% residual stenosis.   Severe mid to distal LAD stenosis. Successful PTCA/DES x 1 mid to distal LAD Severe Diagonal 1 stenosis. Successful PTCA/DES x 1 Diagonal Severe distal Circumflex stenosis. Successful PTCA/DES x 2 distal Circumflex Moderate ostial RCA stenosis. Large dominant RCA   Recommendations: Continue DAPT with ASA and Brilinta for one year. Continue statin and beta blocker.    Diagnostic Dominance: Right Intervention   Echo: 05/26/22   IMPRESSIONS     1. Left ventricular ejection fraction, by estimation, is 50 to 55%. The  left ventricle has low normal function. The left ventricle has no regional  wall motion abnormalities. Left ventricular diastolic parameters are  consistent with Grade II  diastolic  dysfunction (pseudonormalization). Elevated left atrial pressure.   2. Right ventricular systolic function is normal. The right ventricular  size is normal. Tricuspid regurgitation signal is inadequate for assessing  PA pressure.   3. Left atrial size was moderately dilated.   4. The mitral valve is abnormal. Mild mitral valve regurgitation. No  evidence of mitral stenosis.   5. The aortic valve is tricuspid. Aortic valve regurgitation is trivial.  No aortic stenosis is present.   6. Aortic dilatation noted. There is mild dilatation of the ascending  aorta, measuring 36 mm.   7. The inferior vena cava is normal in size with greater than 50%  respiratory variability, suggesting right atrial pressure of 3 mmHg.   FINDINGS   Left Ventricle: Left ventricular ejection fraction, by estimation, is 50  to 55%. The left ventricle has low normal function. The left ventricle has  no regional wall motion abnormalities. The left ventricular internal  cavity size was normal in size.  There is no left ventricular hypertrophy. Left ventricular diastolic  parameters are consistent with Grade II diastolic dysfunction  (pseudonormalization). Elevated left atrial pressure.   Right Ventricle: The right ventricular size is normal. Right vetricular  wall thickness was not well visualized. Right ventricular systolic  function is normal. Tricuspid regurgitation signal is inadequate for  assessing PA pressure.   Left Atrium: Left atrial size was moderately dilated.   Right Atrium: Right atrial size was normal in size.   Pericardium: There is no  evidence of pericardial effusion.   Mitral Valve: The mitral valve is abnormal. Mild mitral valve  regurgitation. No evidence of mitral valve stenosis. MV peak gradient, 3.1  mmHg. The mean mitral valve gradient is 1.0 mmHg.   Tricuspid Valve: The tricuspid valve is normal in structure. Tricuspid  valve regurgitation is not demonstrated. No evidence  of tricuspid  stenosis.   Aortic Valve: The aortic valve is tricuspid. Aortic valve regurgitation is  trivial. No aortic stenosis is present. Aortic valve mean gradient  measures 3.0 mmHg. Aortic valve peak gradient measures 6.0 mmHg. Aortic  valve area, by VTI measures 2.38 cm.   Pulmonic Valve: The pulmonic valve was not well visualized. Pulmonic valve  regurgitation is not visualized. No evidence of pulmonic stenosis.   Aorta: The aortic root is normal in size and structure and aortic  dilatation noted. There is mild dilatation of the ascending aorta,  measuring 36 mm.   Venous: The inferior vena cava is normal in size with greater than 50%  respiratory variability, suggesting right atrial pressure of 3 mmHg.   IAS/Shunts: No atrial level shunt detected by color flow Doppler.    _____________   History of Present Illness     Jeffrey Allen is a 66 y.o. male with a hx of HTN, HLD, prediabetes and prior CVA who was seen 05/26/2022 for the evaluation of chest pain and elevated troponin while at [facility].  Jeffrey Allen was last examined in 01/2020 following a recent admission for COVID-19 and cardiology was consulted during admission given elevated troponin values up to 148. Echocardiogram showed a preserved EF of 55% with no regional wall motion abnormalities and was recommended to discuss ischemic testing as an outpatient once recovered from COVID-19. He denied any recent anginal symptoms at the time of his follow-up visit but given his risk factors and recently elevated enzymes, a Lexiscan Myoview was recommended for further evaluation but it does not appear that this was obtained. He has not been evaluated by Cardiology since.   He presented to [facility] ED on 05/25/2022 evaluation of chest tightness and dizziness. Reported having a syncopal episode the week prior and this occurred in the setting of possible sinusitis.In talking with patient and his wife today, he reports he was diagnosed  with sinusitis on 5/21 and was started on antibiotic therapy at that time. He also had a tooth extraction later that week but denies any complications regarding this. Said he completed a course of antibiotic therapy and had noticed improvement in symptoms that week but over the past 4 to 5 days he has developed worsening dyspnea on exertion and fatigue. 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 He reports that over the past several months he has noticed worsening fatigue and also dyspnea on exertion but feels like symptoms have acutely progressed. Also reports intermittent episodes of left-sided chest pain which occur at rest or with activity and can last for minutes to hours at a time.  Said the pain does radiate into his right neck and shoulder at times. Notes dizziness as well and is unaware of any precipitating factors. No recent changes in medical therapy. Says he was intolerant to multiple statins in the past and had been taking Red Yeast Rice.   Initial labs showed WBC 9.0, Hgb 16.6, platelets  194, Na+ 139, K+ 3.5 and creatinine 0.85. Mg 2.0. Initial and repeat Hs Troponin at 54 and 50. EKG shows NSR, HR 66 with 1st degree AV block and frequent PVC's and couplets. CXR with no acute findings.     He has been started on ASA '81mg'$  daily and IV Heparin. Transferred to [facility] for further evaluation for cath.   Hospital Course     Chest pain: worsening fatigue and dyspnea on exertion as well as chest discomfort. Hs Troponin at 54 and 50.  He was sent to [facility] for further evaluation and underwent cardiac catheterization noted above with PCI/DES x1 to mid/distal LAD, DES x1 to diagonal, DES x2 to distal circumflex.  Placed on DAPT with aspirin/Brilinta for at least 1 year.  No recurrent chest pain.  Seen by cardiac rehab. --Continue on aspirin,  Brilinta, Zetia   HTN: Stable --Unable to add beta-blocker with baseline bradycardia   HFpEF: LVEF of 50-55% on echo, g2DD, normal RV, mild MR.    HLD:  Recent FLP showed total cholesterol 242, triglycerides 127, HDL 48 and LDL 171.   -- Reported statin intolerance, states he is tried several in the past.  Zetia 10 mg daily was added this admission.   -- Referral to lipid clinic placed at discharge    PVC's/Ventricular Bigeminy: Noted on telemetry while at [facility], still noted on telemetry overnight and this morning. -- Cannot start a BB given baseline HR in the 40's to 50's.  -- Mg at 2.0 and K+ 3.6, supplement K --We will plan for cardiac monitor at discharge to quantify PVC burden and assess for any other arrhythmias/HB   Dizziness/Recent Syncopal Episode: His brief syncopal episode occurred 2 weeks ago in the setting of sinusitis. He reports occasional dizziness but no recurrent syncope since.  -- no atrial fibrillation or heart block noted on telemetry -- as above would plan for cardiac monitor at DC    Did the patient have an acute coronary syndrome (MI, NSTEMI, STEMI, etc) this admission?:  Yes                               AHA/ACC Clinical Performance & Quality Measures: Aspirin prescribed? - Yes ADP Receptor Inhibitor (Plavix/Clopidogrel, Brilinta/Ticagrelor or Effient/Prasugrel) prescribed (includes medically managed patients)? - Yes Beta Blocker prescribed? - No - bradycardia High Intensity Statin (Lipitor 40-'80mg'$  or Crestor 20-'40mg'$ ) prescribed? - No - intolerant EF assessed during THIS hospitalization? - Yes For EF <40%, was ACEI/ARB prescribed? - Not Applicable (EF >/= 24%) For EF <40%, Aldosterone Antagonist (Spironolactone or Eplerenone) prescribed? - Not Applicable (EF >/= 40%) Cardiac Rehab Phase II ordered (including medically managed patients)? - Yes       The patient will be scheduled for a TOC follow up appointment in 10-14 days.  A message has been sent to  the [facility] and Scheduling Pool at the office where the patient should be seen for follow up.  _____________  Discharge Vitals Blood pressure 130/82, pulse 67, temperature 97.8 F (36.6 C), temperature source Oral, resp. rate 16, height '5\' 7"'$  (1.702 m), weight 88.8 kg, SpO2 96 %.  Filed Weights   05/25/22 1526 05/25/22 1956  Weight: 86.2 kg 88.8 kg    Labs & Radiologic Studies    CBC Recent Labs    05/26/22 0110 05/27/22 0123  WBC 9.1 9.3  HGB 15.5 16.4  HCT 45.0 47.7  MCV 92.4 90.7  PLT 166  678   Basic Metabolic Panel Recent Labs    05/25/22 1603 05/27/22 0123  NA 139 138  K 3.5 3.6  CL 110 107  CO2 24 23  GLUCOSE 123* 88  BUN 25* 15  CREATININE 0.85 0.96  CALCIUM 8.9 9.1  MG 2.0 2.0   Liver Function Tests No results for input(s): AST, ALT, ALKPHOS, BILITOT, PROT, ALBUMIN in the last 72 hours. No results for input(s): LIPASE, AMYLASE in the last 72 hours. High Sensitivity Troponin:   Recent Labs  Lab 05/25/22 1603 05/25/22 1751 05/26/22 0130  TROPONINIHS 54* 50* 48*    BNP Invalid input(s): POCBNP D-Dimer No results for input(s): DDIMER in the last 72 hours. Hemoglobin A1C Recent Labs    05/26/22 0110  HGBA1C 5.7*   Fasting Lipid Panel No results for input(s): CHOL, HDL, LDLCALC, TRIG, CHOLHDL, LDLDIRECT in the last 72 hours. Thyroid Function Tests No results for input(s): TSH, T4TOTAL, T3FREE, THYROIDAB in the last 72 hours.  Invalid input(s): FREET3 _____________  CARDIAC CATHETERIZATION  Result Date: 05/26/2022   Ost RCA to Prox RCA lesion is 50% stenosed.   Mid Cx lesion is 99% stenosed.   Prox Cx lesion is 70% stenosed.   1st Diag lesion is 80% stenosed.   Dist LAD lesion is 90% stenosed.   A drug-eluting stent was successfully placed using a STENT ONYX FRONTIER 2.25X18.   A drug-eluting stent was successfully placed using a STENT ONYX FRONTIER 3.0X12.   A drug-eluting stent was successfully placed using a SYNERGY XD 2.25X16.   A drug-eluting  stent was successfully placed using a SYNERGY XD 2.25X12.   Post intervention, there is a 0% residual stenosis.   Post intervention, there is a 0% residual stenosis.   Post intervention, there is a 0% residual stenosis.   Post intervention, there is a 0% residual stenosis. Severe mid to distal LAD stenosis. Successful PTCA/DES x 1 mid to distal LAD Severe Diagonal 1 stenosis. Successful PTCA/DES x 1 Diagonal Severe distal Circumflex stenosis. Successful PTCA/DES x 2 distal Circumflex Moderate ostial RCA stenosis. Large dominant RCA Recommendations: Continue DAPT with ASA and Brilinta for one year. Continue statin and beta blocker.   DG Chest Portable 1 View  Result Date: 05/25/2022 CLINICAL DATA:  Chest pain EXAM: PORTABLE CHEST 1 VIEW COMPARISON:  12/29/2019 FINDINGS: The heart size and mediastinal contours are within normal limits. No focal airspace consolidation, pleural effusion, or pneumothorax. IMPRESSION: No active disease. Electronically Signed   By: Davina Poke D.O.   On: 05/25/2022 16:06   ECHOCARDIOGRAM COMPLETE  Result Date: 05/26/2022    ECHOCARDIOGRAM REPORT   Patient Name:   Jeffrey Allen Date of Exam: 05/26/2022 Medical Rec #:  [id number]       Height:       67.0 in Accession #:    [id number]      Weight:       195.8 lb Date of Birth:  1956/04/03       BSA:          2.004 m Patient Age:    36 years        BP:           134/89 mmHg Patient Gender: M               HR:           50 bpm. Exam Location:  [facility] Procedure: 2D Echo, Cardiac Doppler and Color Doppler Indications:    Chest Pain  History:        Patient has prior history of Echocardiogram examinations, most                 recent 12/31/2019. Previous Myocardial Infarction, Stroke,                 Signs/Symptoms:Chest Pain; Risk Factors:Hypertension, Diabetes                 and Dyslipidemia.  Sonographer:    Wenda Low Referring Phys: [place]  1. Left ventricular ejection fraction, by  estimation, is 50 to 55%. The left ventricle has low normal function. The left ventricle has no regional wall motion abnormalities. Left ventricular diastolic parameters are consistent with Grade II diastolic dysfunction (pseudonormalization). Elevated left atrial pressure.  2. Right ventricular systolic function is normal. The right ventricular size is normal. Tricuspid regurgitation signal is inadequate for assessing PA pressure.  3. Left atrial size was moderately dilated.  4. The mitral valve is abnormal. Mild mitral valve regurgitation. No evidence of mitral stenosis.  5. The aortic valve is tricuspid. Aortic valve regurgitation is trivial. No aortic stenosis is present.  6. Aortic dilatation noted. There is mild dilatation of the ascending aorta, measuring 36 mm.  7. The inferior vena cava is normal in size with greater than 50% respiratory variability, suggesting right atrial pressure of 3 mmHg. FINDINGS  Left Ventricle: Left ventricular ejection fraction, by estimation, is 50 to 55%. The left ventricle has low normal function. The left ventricle has no regional wall motion abnormalities. The left ventricular internal cavity size was normal in size. There is no left ventricular hypertrophy. Left ventricular diastolic parameters are consistent with Grade II diastolic dysfunction (pseudonormalization). Elevated left atrial pressure. Right Ventricle: The right ventricular size is normal. Right vetricular wall thickness was not well visualized. Right ventricular systolic function is normal. Tricuspid regurgitation signal is inadequate for assessing PA pressure. Left Atrium: Left atrial size was moderately dilated. Right Atrium: Right atrial size was normal in size. Pericardium: There is no evidence of pericardial effusion. Mitral Valve: The mitral valve is abnormal. Mild mitral valve regurgitation. No evidence of mitral valve stenosis. MV peak gradient, 3.1 mmHg. The mean mitral valve gradient is 1.0 mmHg.  Tricuspid Valve: The tricuspid valve is normal in structure. Tricuspid valve regurgitation is not demonstrated. No evidence of tricuspid stenosis. Aortic Valve: The aortic valve is tricuspid. Aortic valve regurgitation is trivial. No aortic stenosis is present. Aortic valve mean gradient measures 3.0 mmHg. Aortic valve peak gradient measures 6.0 mmHg. Aortic valve area, by VTI measures 2.38 cm. Pulmonic Valve: The pulmonic valve was not well visualized. Pulmonic valve regurgitation is not visualized. No evidence of pulmonic stenosis. Aorta: The aortic root is normal in size and structure and aortic dilatation noted. There is mild dilatation of the ascending aorta, measuring 36 mm. Venous: The inferior vena cava is normal in size with greater than 50% respiratory variability, suggesting right atrial pressure of 3 mmHg. IAS/Shunts: No atrial level shunt detected by color flow Doppler.  LEFT VENTRICLE PLAX 2D LVIDd:         5.50 cm     Diastology LVIDs:         4.00 cm     LV e' medial:    4.78 cm/s LV PW:         1.10 cm     LV E/e' medial:  17.3 LV IVS:        1.00 cm  LV e' lateral:   5.19 cm/s LVOT diam:     2.00 cm     LV E/e' lateral: 15.9 LV SV:         64 LV SV Index:   32 LVOT Area:     3.14 cm  LV Volumes (MOD) LV vol d, MOD A2C: 72.3 ml LV vol d, MOD A4C: 84.7 ml LV vol s, MOD A2C: 32.4 ml LV vol s, MOD A4C: 49.6 ml LV SV MOD A2C:     39.9 ml LV SV MOD A4C:     84.7 ml LV SV MOD BP:      40.7 ml RIGHT VENTRICLE RV Basal diam:  3.35 cm RV Mid diam:    3.30 cm RV S prime:     13.90 cm/s TAPSE (M-mode): 2.0 cm LEFT ATRIUM             Index        RIGHT ATRIUM           Index LA diam:        4.90 cm 2.45 cm/m   RA Area:     18.90 cm LA Vol (A2C):   87.2 ml 43.51 ml/m  RA Volume:   50.50 ml  25.20 ml/m LA Vol (A4C):   93.3 ml 46.56 ml/m LA Biplane Vol: 92.1 ml 45.96 ml/m  AORTIC VALVE                    PULMONIC VALVE AV Area (Vmax):    2.44 cm     PV Vmax:       0.84 m/s AV Area (Vmean):   2.16 cm      PV Peak grad:  2.8 mmHg AV Area (VTI):     2.38 cm AV Vmax:           122.00 cm/s AV Vmean:          79.100 cm/s AV VTI:            0.270 m AV Peak Grad:      6.0 mmHg AV Mean Grad:      3.0 mmHg LVOT Vmax:         94.90 cm/s LVOT Vmean:        54.350 cm/s LVOT VTI:          0.205 m LVOT/AV VTI ratio: 0.76  AORTA Ao Root diam: 2.90 cm Ao Asc diam:  3.60 cm MITRAL VALVE MV Area (PHT): 2.22 cm    SHUNTS MV Area VTI:   1.83 cm    Systemic VTI:  0.20 m MV Peak grad:  3.1 mmHg    Systemic Diam: 2.00 cm MV Mean grad:  1.0 mmHg MV Vmax:       0.88 m/s MV Vmean:      54.5 cm/s MV Decel Time: 341 msec MV E velocity: 82.70 cm/s MV A velocity: 42.80 cm/s MV E/A ratio:  1.93 Carlyle Dolly MD Electronically signed by Carlyle Dolly MD Signature Date/Time: 05/26/2022/9:48:51 AM    Final     Disposition   Pt is being discharged home today in good condition.  Follow-up Plans & Appointments     Follow-up Information     Isaiah Serge, NP Follow up on 06/13/2022.   Specialties: Cardiology, Radiology Why: at 3:30pm for your follow up appt with Dr. Chuck Hint' NP Natasha Bence information: [place] [id number] [phone]  Discharge Instructions     AMB Referral to [facility]   Complete by: As directed    Reason For Referral: Lipids   Amb Referral to Cardiac Rehabilitation   Complete by: As directed    Call wife # [phone]   Diagnosis: Coronary Stents   After initial evaluation and assessments completed: Virtual Based Care may be provided alone or in conjunction with Phase 2 Cardiac Rehab based on patient barriers.: Yes   Call MD for:  difficulty breathing, headache or visual disturbances   Complete by: As directed    Call MD for:  persistant dizziness or light-headedness   Complete by: As directed    Call MD for:  redness, tenderness, or signs of infection (pain, swelling, redness, odor or green/yellow discharge around incision site)   Complete by:  As directed    Diet - low sodium heart healthy   Complete by: As directed    Discharge instructions   Complete by: As directed    Radial Site Care Refer to this sheet in the next few weeks. These instructions provide you with information on caring for yourself after your procedure. Your caregiver may also give you more specific instructions. Your treatment has been planned according to current medical practices, but problems sometimes occur. Call your caregiver if you have any problems or questions after your procedure. HOME CARE INSTRUCTIONS You may shower the day after the procedure. Remove the bandage (dressing) and gently wash the site with plain soap and water. Gently pat the site dry.  Do not apply powder or lotion to the site.  Do not submerge the affected site in water for 3 to 5 days.  Inspect the site at least twice daily.  Do not flex or bend the affected arm for 24 hours.  No lifting over 5 pounds (2.3 kg) for 5 days after your procedure.  Do not drive home if you are discharged the same day of the procedure. Have someone else drive you.  You may drive 24 hours after the procedure unless otherwise instructed by your caregiver.  What to expect: Any bruising will usually fade within 1 to 2 weeks.  Blood that collects in the tissue (hematoma) may be painful to the touch. It should usually decrease in size and tenderness within 1 to 2 weeks.  SEEK IMMEDIATE MEDICAL CARE IF: You have unusual pain at the radial site.  You have redness, warmth, swelling, or pain at the radial site.  You have drainage (other than a small amount of blood on the dressing).  You have chills.  You have a fever or persistent symptoms for more than 72 hours.  You have a fever and your symptoms suddenly get worse.  Your arm becomes pale, cool, tingly, or numb.  You have heavy bleeding from the site. Hold pressure on the site.   PLEASE DO NOT MISS ANY DOSES OF YOUR BRILINTA!!!!! Also keep a log of you blood  pressures and bring back to your follow up appt. Please call the office with any questions.   Patients taking blood thinners should generally stay away from medicines like ibuprofen, Advil, Motrin, naproxen, and Aleve due to risk of stomach bleeding. You may take Tylenol as directed or talk to your primary doctor about alternatives.  PLEASE ENSURE THAT YOU DO NOT RUN OUT OF YOUR BRILINTA/PLAVIX. This medication is very important to remain on for at least one year. IF you have issues obtaining this medication due to cost please CALL the office 3-5  business days prior to running out in order to prevent missing doses of this medication.   Increase activity slowly   Complete by: As directed        Discharge Medications   Allergies as of 05/27/2022       Reactions   Prednisone Other (See Comments)   Peptic ulcers   Statins         Medication List     TAKE these medications    allopurinol 300 MG tablet Commonly known as: ZYLOPRIM Take 0.5 tablets (150 mg total) by mouth daily.   aspirin EC 81 MG tablet Take 1 tablet (81 mg total) by mouth daily.   ezetimibe 10 MG tablet Commonly known as: ZETIA Take 1 tablet (10 mg total) by mouth daily. Start taking on: May 28, 2022   gabapentin 100 MG capsule Commonly known as: NEURONTIN TAKE 1 CAPSULE BY MOUTH THREE TIMES A DAY. What changed: See the new instructions.   Garlic 3474 MG Caps Take 1,000 mg by mouth daily.   ipratropium 0.03 % nasal spray Commonly known as: ATROVENT Place 2 sprays into both nostrils every 12 (twelve) hours.   nitroGLYCERIN 0.4 MG SL tablet Commonly known as: NITROSTAT Place 1 tablet (0.4 mg total) under the tongue every 5 (five) minutes as needed for chest pain.   pantoprazole 40 MG tablet Commonly known as: PROTONIX Take 1 tablet (40 mg total) by mouth daily.   PROBIOTIC-10 PO Take 1 tablet by mouth daily. 18 thousand   RED YEAST RICE PO Take 2 tablets by mouth daily.   ticagrelor 90 MG Tabs  tablet Commonly known as: BRILINTA Take 1 tablet (90 mg total) by mouth 2 (two) times daily.   vitamin B-12 1000 MCG tablet Commonly known as: CYANOCOBALAMIN Take 1,000 mcg by mouth daily.   VITAMIN C PO Take by mouth daily.   Vitamin D3 125 MCG (5000 UT) Tabs Take 10,000 Units by mouth daily.          Outstanding Labs/Studies   Lipid clinic referral done at DC Monitor placed at DC   Duration of Discharge Encounter   Greater than 30 minutes including physician time.  Signed, Reino Bellis, NP 05/27/2022, 11:57 AM   ATTENDING ATTESTATION:   After conducting a review of all available clinical information with the care team, interviewing the patient, and performing a physical exam, I agree with the findings and plan described in this note.   GEN: No acute distress.   HEENT:  MMM, no JVD, no scleral icterus Cardiac: RRR, no murmurs, rubs, or gallops.  Respiratory: Clear to auscultation bilaterally. GI: Soft, nontender, non-distended  MS: No edema; No deformity. Neuro:  Nonfocal  Vasc:  +2 radial pulses   The patient is doing well after multivessel PCI for chest pain and dyspnea with an elevated troponin consistent with acute coronary syndrome.  He is doing well today.  He did have an episode of presyncope a few weeks ago at work.  Because of frequent PVCs and NSVT we will obtain a monitor on discharge.  Will discharge today on dual antiplatelet therapy.  The patient will be seen in lipid clinic as he is statin intolerant.  We will arrange follow-up and cardiac rehabilitation.   Lenna Sciara, MD Pager [phone]

## 2022-05-27 NOTE — TOC Benefit Eligibility Note (Signed)
Patient Advocate Encounter   Received notification that prior authorization for Brilinta '90MG'$  tablets is required.   PA submitted on 05/27/2022 Key [id number] Status is pending       Lyndel Safe, [place] Patient Advocate Specialist [place] Patient Advocate Team Direct Number: [phone]  Fax: [phone]

## 2022-05-27 NOTE — Progress Notes (Signed)
CARDIAC REHAB PHASE I   PRE:  Rate/Rhythm: 66 NSR with occasional PVCs  BP:  Sitting: 130/82      SaO2: 95 RA  MODE:  Ambulation: 470 ft   POST:  Rate/Rhythm: 92 NSR with trigeminy/NSVT  BP:  Sitting: 115/77      SaO2: 98 RA   Seen pt from 0800-0900, pt was ambulated through hallways independently, noticed multiple PVCs and NSVT throughout ambulation. Pt was returned to bed and felt some dizziness 1/10 which resolved with sitting. pt was educated on stent procedure, Aspirin & Birlinta med use, ex guidelines, NTG use, diet, and CRPII. Pt is being referred to CRPII in [place].   Christen Bame  8:58 AM 05/27/2022

## 2022-05-27 NOTE — Progress Notes (Addendum)
Progress Note  Patient Name: ILIJA Allen Date of Encounter: 05/27/2022  [facility] Cardiologist: Rozann Lesches, MD   Subjective   No chest pain, walked with CR and did feel light-headed briefly.   Inpatient Medications    Scheduled Meds:  allopurinol  150 mg Oral Daily   aspirin EC  81 mg Oral Daily   ezetimibe  10 mg Oral Daily   gabapentin  100 mg Oral QHS   pantoprazole  40 mg Oral Daily   potassium chloride  40 mEq Oral Once   sodium chloride flush  3 mL Intravenous Q12H   ticagrelor  90 mg Oral BID   Continuous Infusions:  sodium chloride     PRN Meds: sodium chloride, acetaminophen **OR** acetaminophen, nitroGLYCERIN, ondansetron (ZOFRAN) IV, polyethylene glycol, sodium chloride flush   Vital Signs    Vitals:   05/26/22 1645 05/26/22 1935 05/27/22 0548 05/27/22 0808  BP: (!) 145/97 (!) 122/91 132/86   Pulse:  (!) 58 (!) 55   Resp:  18 16   Temp: 97.7 F (36.5 C) 97.9 F (36.6 C) 98 F (36.7 C) 97.8 F (36.6 C)  TempSrc: Oral Oral Oral Oral  SpO2:  96% 97%   Weight:      Height:        Intake/Output Summary (Last 24 hours) at 05/27/2022 0820 Last data filed at 05/27/2022 0411 Gross per 24 hour  Intake 891.37 ml  Output --  Net 891.37 ml      05/25/2022    7:56 PM 05/25/2022    3:26 PM 03/15/2022    8:36 AM  Last 3 Weights  Weight (lbs) 195 lb 12.3 oz 190 lb 192 lb 12.8 oz  Weight (kg) 88.8 kg 86.183 kg 87.454 kg      Telemetry    SR with intermittent SB, PVCs, couplets, short runs of NSVT - Personally Reviewed  ECG    SB 47bpm, 1st degree AVB, PVC  - Personally Reviewed  Physical Exam   GEN: No acute distress.   Neck: No JVD Cardiac: RRR, no murmurs, rubs, or gallops.  Respiratory: Clear to auscultation bilaterally. GI: Soft, nontender, non-distended  MS: No edema; No deformity. Right radial cath site stable.  Neuro:  Nonfocal  Psych: Normal affect   Labs    High Sensitivity Troponin:   Recent Labs  Lab 05/25/22 1603  05/25/22 1751 05/26/22 0130  TROPONINIHS 54* 50* 48*     Chemistry Recent Labs  Lab 05/24/22 0804 05/25/22 1603 05/27/22 0123  NA 141 139 138  K 4.2 3.5 3.6  CL 103 110 107  CO2 '24 24 23  '$ GLUCOSE 91 123* 88  BUN 25 25* 15  CREATININE 0.98 0.85 0.96  CALCIUM 9.7 8.9 9.1  MG  --  2.0 2.0  PROT 6.7  --   --   ALBUMIN 4.2  --   --   AST 19  --   --   ALT 19  --   --   ALKPHOS 67  --   --   BILITOT 0.4  --   --   GFRNONAA  --  >60 >60  ANIONGAP  --  5 8    Lipids  Recent Labs  Lab 05/24/22 0804  CHOL 242*  TRIG 127  HDL 48  LABVLDL 23  LDLCALC 171*  CHOLHDL 5.0    Hematology Recent Labs  Lab 05/25/22 1603 05/26/22 0110 05/27/22 0123  WBC 9.0 9.1 9.3  RBC 5.26 4.87 5.26  HGB 16.6 15.5 16.4  HCT 48.1 45.0 47.7  MCV 91.4 92.4 90.7  MCH 31.6 31.8 31.2  MCHC 34.5 34.4 34.4  RDW 11.9 12.1 11.9  PLT 194 166 162   Thyroid  Recent Labs  Lab 05/24/22 0804  TSH 1.230  FREET4 1.57    BNPNo results for input(s): BNP, PROBNP in the last 168 hours.  DDimer No results for input(s): DDIMER in the last 168 hours.   Radiology    CARDIAC CATHETERIZATION  Result Date: 05/26/2022   Ost RCA to Prox RCA lesion is 50% stenosed.   Mid Cx lesion is 99% stenosed.   Prox Cx lesion is 70% stenosed.   1st Diag lesion is 80% stenosed.   Dist LAD lesion is 90% stenosed.   A drug-eluting stent was successfully placed using a STENT ONYX FRONTIER 2.25X18.   A drug-eluting stent was successfully placed using a STENT ONYX FRONTIER 3.0X12.   A drug-eluting stent was successfully placed using a SYNERGY XD 2.25X16.   A drug-eluting stent was successfully placed using a SYNERGY XD 2.25X12.   Post intervention, there is a 0% residual stenosis.   Post intervention, there is a 0% residual stenosis.   Post intervention, there is a 0% residual stenosis.   Post intervention, there is a 0% residual stenosis. Severe mid to distal LAD stenosis. Successful PTCA/DES x 1 mid to distal LAD Severe Diagonal 1  stenosis. Successful PTCA/DES x 1 Diagonal Severe distal Circumflex stenosis. Successful PTCA/DES x 2 distal Circumflex Moderate ostial RCA stenosis. Large dominant RCA Recommendations: Continue DAPT with ASA and Brilinta for one year. Continue statin and beta blocker.   DG Chest Portable 1 View  Result Date: 05/25/2022 CLINICAL DATA:  Chest pain EXAM: PORTABLE CHEST 1 VIEW COMPARISON:  12/29/2019 FINDINGS: The heart size and mediastinal contours are within normal limits. No focal airspace consolidation, pleural effusion, or pneumothorax. IMPRESSION: No active disease. Electronically Signed   By: Davina Poke D.O.   On: 05/25/2022 16:06   ECHOCARDIOGRAM COMPLETE  Result Date: 05/26/2022    ECHOCARDIOGRAM REPORT   Patient Name:   Jeffrey Allen Date of Exam: 05/26/2022 Medical Rec #:  [id number]       Height:       67.0 in Accession #:    [id number]      Weight:       195.8 lb Date of Birth:  1956-10-02       BSA:          2.004 m Patient Age:    66 years        BP:           134/89 mmHg Patient Gender: M               HR:           50 bpm. Exam Location:  [facility] Procedure: 2D Echo, Cardiac Doppler and Color Doppler Indications:    Chest Pain  History:        Patient has prior history of Echocardiogram examinations, most                 recent 12/31/2019. Previous Myocardial Infarction, Stroke,                 Signs/Symptoms:Chest Pain; Risk Factors:Hypertension, Diabetes                 and Dyslipidemia.  Sonographer:    Wenda Low Referring Phys: [place]  1. Left ventricular ejection fraction, by estimation, is 50 to 55%. The left ventricle has low normal function. The left ventricle has no regional wall motion abnormalities. Left ventricular diastolic parameters are consistent with Grade II diastolic dysfunction (pseudonormalization). Elevated left atrial pressure.  2. Right ventricular systolic function is normal. The right ventricular size is normal. Tricuspid  regurgitation signal is inadequate for assessing PA pressure.  3. Left atrial size was moderately dilated.  4. The mitral valve is abnormal. Mild mitral valve regurgitation. No evidence of mitral stenosis.  5. The aortic valve is tricuspid. Aortic valve regurgitation is trivial. No aortic stenosis is present.  6. Aortic dilatation noted. There is mild dilatation of the ascending aorta, measuring 36 mm.  7. The inferior vena cava is normal in size with greater than 50% respiratory variability, suggesting right atrial pressure of 3 mmHg. FINDINGS  Left Ventricle: Left ventricular ejection fraction, by estimation, is 50 to 55%. The left ventricle has low normal function. The left ventricle has no regional wall motion abnormalities. The left ventricular internal cavity size was normal in size. There is no left ventricular hypertrophy. Left ventricular diastolic parameters are consistent with Grade II diastolic dysfunction (pseudonormalization). Elevated left atrial pressure. Right Ventricle: The right ventricular size is normal. Right vetricular wall thickness was not well visualized. Right ventricular systolic function is normal. Tricuspid regurgitation signal is inadequate for assessing PA pressure. Left Atrium: Left atrial size was moderately dilated. Right Atrium: Right atrial size was normal in size. Pericardium: There is no evidence of pericardial effusion. Mitral Valve: The mitral valve is abnormal. Mild mitral valve regurgitation. No evidence of mitral valve stenosis. MV peak gradient, 3.1 mmHg. The mean mitral valve gradient is 1.0 mmHg. Tricuspid Valve: The tricuspid valve is normal in structure. Tricuspid valve regurgitation is not demonstrated. No evidence of tricuspid stenosis. Aortic Valve: The aortic valve is tricuspid. Aortic valve regurgitation is trivial. No aortic stenosis is present. Aortic valve mean gradient measures 3.0 mmHg. Aortic valve peak gradient measures 6.0 mmHg. Aortic valve area, by VTI  measures 2.38 cm. Pulmonic Valve: The pulmonic valve was not well visualized. Pulmonic valve regurgitation is not visualized. No evidence of pulmonic stenosis. Aorta: The aortic root is normal in size and structure and aortic dilatation noted. There is mild dilatation of the ascending aorta, measuring 36 mm. Venous: The inferior vena cava is normal in size with greater than 50% respiratory variability, suggesting right atrial pressure of 3 mmHg. IAS/Shunts: No atrial level shunt detected by color flow Doppler.  LEFT VENTRICLE PLAX 2D LVIDd:         5.50 cm     Diastology LVIDs:         4.00 cm     LV e' medial:    4.78 cm/s LV PW:         1.10 cm     LV E/e' medial:  17.3 LV IVS:        1.00 cm     LV e' lateral:   5.19 cm/s LVOT diam:     2.00 cm     LV E/e' lateral: 15.9 LV SV:         64 LV SV Index:   32 LVOT Area:     3.14 cm  LV Volumes (MOD) LV vol d, MOD A2C: 72.3 ml LV vol d, MOD A4C: 84.7 ml LV vol s, MOD A2C: 32.4 ml LV vol s, MOD A4C: 49.6 ml LV SV MOD A2C:  39.9 ml LV SV MOD A4C:     84.7 ml LV SV MOD BP:      40.7 ml RIGHT VENTRICLE RV Basal diam:  3.35 cm RV Mid diam:    3.30 cm RV S prime:     13.90 cm/s TAPSE (M-mode): 2.0 cm LEFT ATRIUM             Index        RIGHT ATRIUM           Index LA diam:        4.90 cm 2.45 cm/m   RA Area:     18.90 cm LA Vol (A2C):   87.2 ml 43.51 ml/m  RA Volume:   50.50 ml  25.20 ml/m LA Vol (A4C):   93.3 ml 46.56 ml/m LA Biplane Vol: 92.1 ml 45.96 ml/m  AORTIC VALVE                    PULMONIC VALVE AV Area (Vmax):    2.44 cm     PV Vmax:       0.84 m/s AV Area (Vmean):   2.16 cm     PV Peak grad:  2.8 mmHg AV Area (VTI):     2.38 cm AV Vmax:           122.00 cm/s AV Vmean:          79.100 cm/s AV VTI:            0.270 m AV Peak Grad:      6.0 mmHg AV Mean Grad:      3.0 mmHg LVOT Vmax:         94.90 cm/s LVOT Vmean:        54.350 cm/s LVOT VTI:          0.205 m LVOT/AV VTI ratio: 0.76  AORTA Ao Root diam: 2.90 cm Ao Asc diam:  3.60 cm MITRAL VALVE MV  Area (PHT): 2.22 cm    SHUNTS MV Area VTI:   1.83 cm    Systemic VTI:  0.20 m MV Peak grad:  3.1 mmHg    Systemic Diam: 2.00 cm MV Mean grad:  1.0 mmHg MV Vmax:       0.88 m/s MV Vmean:      54.5 cm/s MV Decel Time: 341 msec MV E velocity: 82.70 cm/s MV A velocity: 42.80 cm/s MV E/A ratio:  1.93 Carlyle Dolly MD Electronically signed by Carlyle Dolly MD Signature Date/Time: 05/26/2022/9:48:51 AM    Final     Cardiac Studies   Cath: 05/26/22  Ost RCA to Prox RCA lesion is 50% stenosed.   Mid Cx lesion is 99% stenosed.   Prox Cx lesion is 70% stenosed.   1st Diag lesion is 80% stenosed.   Dist LAD lesion is 90% stenosed.   A drug-eluting stent was successfully placed using a STENT ONYX FRONTIER 2.25X18.   A drug-eluting stent was successfully placed using a STENT ONYX FRONTIER 3.0X12.   A drug-eluting stent was successfully placed using a SYNERGY XD 2.25X16.   A drug-eluting stent was successfully placed using a SYNERGY XD 2.25X12.   Post intervention, there is a 0% residual stenosis.   Post intervention, there is a 0% residual stenosis.   Post intervention, there is a 0% residual stenosis.   Post intervention, there is a 0% residual stenosis.   Severe mid to distal LAD stenosis. Successful PTCA/DES x 1 mid to distal LAD Severe Diagonal 1 stenosis.  Successful PTCA/DES x 1 Diagonal Severe distal Circumflex stenosis. Successful PTCA/DES x 2 distal Circumflex Moderate ostial RCA stenosis. Large dominant RCA   Recommendations: Continue DAPT with ASA and Brilinta for one year. Continue statin and beta blocker.   Diagnostic Dominance: Right Intervention   Echo: 05/26/22  IMPRESSIONS     1. Left ventricular ejection fraction, by estimation, is 50 to 55%. The  left ventricle has low normal function. The left ventricle has no regional  wall motion abnormalities. Left ventricular diastolic parameters are  consistent with Grade II diastolic  dysfunction (pseudonormalization). Elevated  left atrial pressure.   2. Right ventricular systolic function is normal. The right ventricular  size is normal. Tricuspid regurgitation signal is inadequate for assessing  PA pressure.   3. Left atrial size was moderately dilated.   4. The mitral valve is abnormal. Mild mitral valve regurgitation. No  evidence of mitral stenosis.   5. The aortic valve is tricuspid. Aortic valve regurgitation is trivial.  No aortic stenosis is present.   6. Aortic dilatation noted. There is mild dilatation of the ascending  aorta, measuring 36 mm.   7. The inferior vena cava is normal in size with greater than 50%  respiratory variability, suggesting right atrial pressure of 3 mmHg.   FINDINGS   Left Ventricle: Left ventricular ejection fraction, by estimation, is 50  to 55%. The left ventricle has low normal function. The left ventricle has  no regional wall motion abnormalities. The left ventricular internal  cavity size was normal in size.  There is no left ventricular hypertrophy. Left ventricular diastolic  parameters are consistent with Grade II diastolic dysfunction  (pseudonormalization). Elevated left atrial pressure.   Right Ventricle: The right ventricular size is normal. Right vetricular  wall thickness was not well visualized. Right ventricular systolic  function is normal. Tricuspid regurgitation signal is inadequate for  assessing PA pressure.   Left Atrium: Left atrial size was moderately dilated.   Right Atrium: Right atrial size was normal in size.   Pericardium: There is no evidence of pericardial effusion.   Mitral Valve: The mitral valve is abnormal. Mild mitral valve  regurgitation. No evidence of mitral valve stenosis. MV peak gradient, 3.1  mmHg. The mean mitral valve gradient is 1.0 mmHg.   Tricuspid Valve: The tricuspid valve is normal in structure. Tricuspid  valve regurgitation is not demonstrated. No evidence of tricuspid  stenosis.   Aortic Valve: The aortic  valve is tricuspid. Aortic valve regurgitation is  trivial. No aortic stenosis is present. Aortic valve mean gradient  measures 3.0 mmHg. Aortic valve peak gradient measures 6.0 mmHg. Aortic  valve area, by VTI measures 2.38 cm.   Pulmonic Valve: The pulmonic valve was not well visualized. Pulmonic valve  regurgitation is not visualized. No evidence of pulmonic stenosis.   Aorta: The aortic root is normal in size and structure and aortic  dilatation noted. There is mild dilatation of the ascending aorta,  measuring 36 mm.   Venous: The inferior vena cava is normal in size with greater than 50%  respiratory variability, suggesting right atrial pressure of 3 mmHg.   IAS/Shunts: No atrial level shunt detected by color flow Doppler.   Patient Profile     66 y.o. male with a hx of HTN, HLD, prediabetes and prior CVA who was seen 05/26/2022 for the evaluation of chest pain and elevated troponin values at the request of Dr. Arlyce Dice.  Assessment & Plan    Chest pain: worsening  fatigue and dyspnea on exertion as well as chest discomfort. Hs Troponin at 54 and 50.  He was sent to [facility] for further evaluation and underwent cardiac catheterization noted above with PCI/DES x1 to mid/distal LAD, DES x1 to diagonal, DES x2 to distal circumflex.  Placed on DAPT with aspirin/Brilinta for at least 1 year.  No recurrent chest pain.  Seen by cardiac rehab. --Continue on aspirin, Brilinta, Zetia   HTN: Stable --Unable to add beta-blocker with baseline bradycardia  HFpEF: LVEF of 50-55% on echo, g2DD, normal RV, mild MR.   HLD:  Recent FLP showed total cholesterol 242, triglycerides 127, HDL 48 and LDL 171.   -- Reported statin intolerance, states he is tried several in the past.  Zetia 10 mg daily was added this admission.   -- Referral to lipid clinic placed at discharge    PVC's/Ventricular Bigeminy: Noted on telemetry while at [facility], still noted on telemetry overnight and this morning. --  Cannot start a BB given baseline HR in the 40's to 50's.  -- Mg at 2.0 and K+ 3.6, supplement K --We will plan for cardiac monitor at discharge to quantify PVC burden and assess for any other arrhythmias/HB   Dizziness/Recent Syncopal Episode: His brief syncopal episode occurred 2 weeks ago in the setting of sinusitis. He reports occasional dizziness but no recurrent syncope since.  -- no atrial fibrillation or heart block noted on telemetry -- as above would plan for cardiac monitor at DC  For questions or updates, please contact [place] Please consult [email or web address] for contact info under        Signed, Reino Bellis, NP  05/27/2022, 8:20 AM    ATTENDING ATTESTATION:  After conducting a review of all available clinical information with the care team, interviewing the patient, and performing a physical exam, I agree with the findings and plan described in this note.   GEN: No acute distress.   HEENT:  MMM, no JVD, no scleral icterus Cardiac: RRR, no murmurs, rubs, or gallops.  Respiratory: Clear to auscultation bilaterally. GI: Soft, nontender, non-distended  MS: No edema; No deformity. Neuro:  Nonfocal  Vasc:  +2 radial pulses  The patient is doing well after multivessel PCI for chest pain and dyspnea with an elevated troponin consistent with acute coronary syndrome.  He is doing well today.  He did have an episode of presyncope a few weeks ago at work.  Because of frequent PVCs and NSVT we will obtain a monitor on discharge.  Will discharge today on dual antiplatelet therapy.  The patient will be seen in lipid clinic as he is statin intolerant.  We will arrange follow-up and cardiac rehabilitation.  Lenna Sciara, MD Pager [phone]

## 2022-05-27 NOTE — TOC Benefit Eligibility Note (Signed)
Patient Advocate Encounter  Received notification that the request for prior authorization for Brilinta '90MG'$  tablets has been denied due to only covers the medications on Formulary.       Lyndel Safe, [place] Patient Advocate Specialist [place] Patient Advocate Team Direct Number: [phone]  Fax: [phone]

## 2022-05-27 NOTE — TOC Benefit Eligibility Note (Signed)
Patient Advocate Encounter   Received notification that prior authorization for Jardiance '10MG'$  tablets is required.   PA submitted on 05/27/2022 Key [id number] Status is pending       Lyndel Safe, [place] Patient Advocate Specialist [place] Patient Advocate Team Direct Number: [phone]  Fax: [phone]

## 2022-05-27 NOTE — TOC Benefit Eligibility Note (Addendum)
Patient Teacher, English as a foreign language completed.    The patient is currently admitted and upon discharge could be taking Brilinta 90 mg.  Not on Formulary  The patient is currently admitted and upon discharge could be taking Farxiga 10 mg.  Requires Prior Authorization  The patient is currently admitted and upon discharge could be taking Jardiance 10 mg.  Requires Prior Authorization  The patient is insured through [place], [place] Patient Advocate Specialist [place] Patient Advocate Team Direct Number: [phone]  Fax: [phone]

## 2022-05-27 NOTE — TOC Benefit Eligibility Note (Signed)
Patient Advocate Encounter   Received notification that the request for prior authorization for Jardiance '10MG'$  tablets has been denied due to only covers the medications on Formulary.         Lyndel Safe, [place] Patient Advocate Specialist [place] Patient Advocate Team Direct Number: [phone]  Fax: [phone]

## 2022-05-28 DIAGNOSIS — I493 Ventricular premature depolarization: Secondary | ICD-10-CM | POA: Diagnosis not present

## 2022-05-31 ENCOUNTER — Ambulatory Visit: Payer: 59 | Admitting: "Endocrinology

## 2022-05-31 ENCOUNTER — Encounter: Payer: Self-pay | Admitting: "Endocrinology

## 2022-05-31 VITALS — BP 118/68 | HR 52 | Ht 67.0 in | Wt 194.4 lb

## 2022-05-31 DIAGNOSIS — E782 Mixed hyperlipidemia: Secondary | ICD-10-CM | POA: Diagnosis not present

## 2022-05-31 DIAGNOSIS — E291 Testicular hypofunction: Secondary | ICD-10-CM | POA: Diagnosis not present

## 2022-05-31 DIAGNOSIS — R7303 Prediabetes: Secondary | ICD-10-CM | POA: Diagnosis not present

## 2022-05-31 NOTE — Patient Instructions (Signed)

## 2022-05-31 NOTE — Progress Notes (Signed)
05/31/2022                          Endocrinology follow-up note   Jeffrey Allen, 66 y.o., male   Chief Complaint  Patient presents with   Follow-up    Hypogonadism, male     Past Medical History:  Diagnosis Date   GERD (gastroesophageal reflux disease)    Gout    Obesity (BMI 30.0-34.9)    Peptic ulcer disease    Prediabetes    Stroke [facility]) 06/2017   Past Surgical History:  Procedure Laterality Date   BIOPSY  11/25/2020   Procedure: BIOPSY;  Surgeon: Daneil Dolin, MD;  Location: [facility];  Service: Endoscopy;;  gastric   COLONOSCOPY     COLONOSCOPY N/A 12/07/2016   Scattered small and large-mouthed diverticula in sigmoid and descending colon. Six semi-pedunculated polyps in sigmoid and ascending colon, 4-6 mm in size. Distal TI normal appearing. Hyperplastic and tubular adenomas. 3 year surveillance recommended.    COLONOSCOPY N/A 11/25/2020   diverticulosis in sigmoid and descending colon, three 3-4 mm polyps at rectosigmoid colon and hepatic flexure. Hyperplastic polyp. 5 year colonoscopy.    CORONARY STENT INTERVENTION N/A 05/26/2022   Procedure: CORONARY STENT INTERVENTION;  Surgeon: Burnell Blanks, MD;  Location: [facility];  Service: Cardiovascular;  Laterality: N/A;   ESOPHAGOGASTRODUODENOSCOPY N/A 11/25/2020     normal esophagus, portal gastropathy, normal duodenum. Mild chronic gastritis, negative H.pylori.    LEFT HEART CATH AND CORS/GRAFTS ANGIOGRAPHY N/A 05/26/2022   Procedure: LEFT HEART CATH AND CORS/GRAFTS ANGIOGRAPHY;  Surgeon: Burnell Blanks, MD;  Location: [facility];  Service: Cardiovascular;  Laterality: N/A;   POLYPECTOMY  11/25/2020   Procedure: POLYPECTOMY;  Surgeon: Daneil Dolin, MD;  Location: [facility];  Service: Endoscopy;;   TOOTH EXTRACTION     Social History   Socioeconomic History   Marital status: Married    Spouse name: Not on file   Number of children: Not on file   Years of  education: Not on file   Highest education level: Not on file  Occupational History   Not on file  Tobacco Use   Smoking status: Former    Packs/day: 0.02    Years: 25.00    Pack years: 0.50    Types: Cigarettes    Quit date: 12/26/2006    Years since quitting: 15.4   Smokeless tobacco: Never  Vaping Use   Vaping Use: Never used  Substance and Sexual Activity   Alcohol use: Not Currently    Comment: beer twice per week, 2 at a time.    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   Social Determinants of Health   Financial Resource Strain: Not on file  Food Insecurity: Not on file  Transportation Needs: Not on file  Physical Activity: Not on file  Stress: Not on file  Social Connections: Not on file   Outpatient Encounter Medications as of 05/31/2022  Medication Sig   allopurinol (ZYLOPRIM) 300 MG tablet Take 0.5 tablets (150 mg total) by mouth daily.   Ascorbic Acid (VITAMIN C PO) Take by mouth daily.   aspirin EC 81 MG EC tablet Take 1 tablet (81 mg total) by mouth daily.   Cholecalciferol (VITAMIN D3) 125 MCG (5000 UT) TABS Take  10,000 Units by mouth daily.    ezetimibe (ZETIA) 10 MG tablet Take 1 tablet (10 mg total) by mouth daily.   gabapentin (NEURONTIN) 100 MG capsule TAKE 1 CAPSULE BY MOUTH THREE TIMES A DAY. (Patient taking differently: daily.)   Garlic 4580 MG CAPS Take 1,000 mg by mouth daily.   ipratropium (ATROVENT) 0.03 % nasal spray Place 2 sprays into both nostrils every 12 (twelve) hours. (Patient not taking: Reported on 05/25/2022)   nitroGLYCERIN (NITROSTAT) 0.4 MG SL tablet Place 1 tablet (0.4 mg total) under the tongue every 5 (five) minutes as needed for chest pain.   pantoprazole (PROTONIX) 40 MG tablet Take 1 tablet (40 mg total) by mouth daily.   Probiotic Product (PROBIOTIC-10 PO) Take 1 tablet by mouth daily. 18 thousand   Red Yeast  Rice Extract (RED YEAST RICE PO) Take 2 tablets by mouth daily.   ticagrelor (BRILINTA) 90 MG TABS tablet Take 1 tablet (90 mg total) by mouth 2 (two) times daily.   vitamin B-12 (CYANOCOBALAMIN) 1000 MCG tablet Take 1,000 mcg by mouth daily.   No facility-administered encounter medications on file as of 05/31/2022.   ALLERGIES: Allergies  Allergen Reactions   Prednisone Other (See Comments)    Peptic ulcers     Statins     VACCINATION STATUS: Immunization History  Administered Date(s) Administered   Influenza,inj,Quad PF,6+ Mos 11/07/2019, 10/15/2020   Tdap 02/16/2021    HPI: Jeffrey Allen is a 66 y.o.-year-old man, being seen in follow-up after he was seen in consultation for evaluation and management of low testosterone requested by by his   provider  Dearing Nation, MD.   He presents alone today to the clinic.  I have reviewed his interval history  which involved coronary artery  disease which required stent placement x4.  See notes from previous visit.  Reportedly, he was diagnosed with hypogonadism in 2019 with total testosterone of 220.  Subsequently, he was treated with injectable testosterone with increase in his testosterone to about 1000 on 2 separate occasions.  During the course of his testosterone treatment he developed polycythemia.  Recently he saw a new physician for primary care who was concerned about testosterone treatment while he is having polycythemia and was to stop treatment 5 months ago.  More recent lab work on February 04, 2022 showed total testosterone of 168.  Due to adverse effects from excessive testosterone treatment, he was advised to withdrawal his testosterone therapy  after his last visit. His previsit labs confirm total testosterone of 198, more than 3 months after his last injection. He is a former smoker with history of CVA in July 2019, coronary artery disease 2023.    He is not looking for any fertility.  He denies testicular injury,  chemotherapy nor radiation.  He denies any history of head injury.  No recent rapid weight change. No chronic diseases. No chronic pain. Not on opiates, does not take steroids.  He denies excessive alcohol intake. No anabolic steroids use. Not on antidepressants.  He does not have family  Or personal history of premature  cardiac disease.   ROS: Constitutional: + Minimally fluctuating body weight,  + fatigue, no subjective hyperthermia/hypothermia Eyes: no blurry vision, no xerophthalmia ENT: no sore throat, no nodules palpated in throat, no dysphagia/odynophagia, no hoarseness Cardiovascular: no CP/SOB/palpitations/leg swelling Respiratory: no cough/SOB Gastrointestinal: no N/V/D/C Musculoskeletal: no muscle/joint aches Skin: no rashes Neurological: no tremors/numbness/tingling/dizziness Psychiatric: no depression/anxiety  PE: BP 118/68   Pulse (!) 52  Ht '5\' 7"'$  (1.702 m)   Wt 194 lb 6.4 oz (88.2 kg)   BMI 30.45 kg/m  Wt Readings from Last 3 Encounters:  05/31/22 194 lb 6.4 oz (88.2 kg)  05/25/22 195 lb 12.3 oz (88.8 kg)  03/15/22 192 lb 12.8 oz (87.5 kg)   Constitutional:  Body mass index is 30.45 kg/m.,  not in acute distress, + Normal State of Mind Eyes: PERRLA, EOMI, no exophthalmos ENT: moist mucous membranes, no thyromegaly, no cervical  Genital exam: From last exam: Normal male escutcheon, no inguinal LAD, normal phallus, testes are shrunk bilaterally to 6 to 8 cc,  no testicular masses, no penile discharge.  No gynecomastia.   CMP ( most recent) CMP     Component Value Date/Time   NA 138 05/27/2022 0123   NA 141 05/24/2022 0804   K 3.6 05/27/2022 0123   CL 107 05/27/2022 0123   CO2 23 05/27/2022 0123   GLUCOSE 88 05/27/2022 0123   BUN 15 05/27/2022 0123   BUN 25 05/24/2022 0804   CREATININE 0.96 05/27/2022 0123   CREATININE 1.07 02/16/2021 0940   CALCIUM 9.1 05/27/2022 0123   PROT 6.7 05/24/2022 0804   ALBUMIN 4.2 05/24/2022 0804   AST 19 05/24/2022  0804   ALT 19 05/24/2022 0804   ALKPHOS 67 05/24/2022 0804   BILITOT 0.4 05/24/2022 0804   GFRNONAA >60 05/27/2022 0123   GFRNONAA 73 02/16/2021 0940   GFRAA 85 02/16/2021 0940     Diabetic Labs (most recent): Lab Results  Component Value Date   HGBA1C 5.7 (H) 05/26/2022   HGBA1C 5.5 02/16/2021   HGBA1C 5.6 12/29/2019     Lipid Panel ( most recent) Lipid Panel     Component Value Date/Time   CHOL 242 (H) 05/24/2022 0804   TRIG 127 05/24/2022 0804   HDL 48 05/24/2022 0804   CHOLHDL 5.0 05/24/2022 0804   CHOLHDL 5.6 (H) 02/16/2021 0940   VLDL 21 12/30/2019 0002   LDLCALC 171 (H) 05/24/2022 0804   LDLCALC 162 (H) 02/16/2021 0940   LABVLDL 23 05/24/2022 0804      Lab Results  Component Value Date   TSH 1.230 05/24/2022   TSH 1.46 02/16/2021   FREET4 1.57 05/24/2022   FREET4 1.2 02/16/2021    CBC    Component Value Date/Time   WBC 9.3 05/27/2022 0123   RBC 5.26 05/27/2022 0123   HGB 16.4 05/27/2022 0123   HGB 17.1 05/24/2022 0804   HCT 47.7 05/27/2022 0123   HCT 50.2 05/24/2022 0804   PLT 162 05/27/2022 0123   PLT 201 05/24/2022 0804   MCV 90.7 05/27/2022 0123   MCV 91 05/24/2022 0804   MCH 31.2 05/27/2022 0123   MCHC 34.4 05/27/2022 0123   RDW 11.9 05/27/2022 0123   RDW 12.0 05/24/2022 0804   LYMPHSABS 1.8 05/24/2022 0804   MONOABS 0.6 06/18/2018 1822   EOSABS 0.2 05/24/2022 0804   BASOSABS 0.1 05/24/2022 0804    Latest Reference Range & Units 11/07/19 10:57 04/21/20 09:40  Sex Horm Binding Glob, Serum 22 - 77 nmol/L 20 (L) 24  Testosterone 250 - 827 ng/dL 1,072 (H) 1,077 (H)  Testosterone, Bioavailable 110.0 - 575 ng/dL 516.3 449.8  Testosterone Free 46.0 - 224.0 pg/mL 268.1 (H) 238.9 (H)  (L): Data is abnormally low (H): Data is abnormally high     Latest Reference Range & Units 05/24/22 08:04  Sex Horm Binding Glob, Serum 19.3 - 76.4 nmol/L 27.0  Testosterone 264 - 916 ng/dL  198 (L)  Testosterone Free 6.6 - 18.1 pg/mL 5.0 (L)  (L): Data is  abnormally low   February 04, 2022 total testosterone 168  ASSESSMENT: 1. Hypogonadism 2.  Polycythemia-resolved 3.  Hyperlipidemia 4.  Prediabetes  PLAN:  More than 3 months after his last testosterone injection, he maintains total testosterone of 198.   - I have discussed potential causes of hypogonadism, diagnosis of hypogonadism,  and relevant workup to confirm the diagnosis of hypogonadism before initiating testosterone replacement therapy.   -I do not have a chance to review the circumstance of his diagnosis with hypogonadism.   During his last visit, he was found to have supraphysiologic testosterone levels.  He was advised to discontinue testosterone until subsequent measurement.  He has improvement in his CBC, CMP.  However, he did have coronary artery disease which required stent placement since last visit.  He is advised to stay off of testosterone for now until next visit in 4 months.   If he returns with testosterone less than 200, he will be reconsidered for low-dose testosterone replacement therapy with caution.  This is important given the fact that he has been significant polycythemia, history of stroke, history of coronary artery disease, history of smoking putting him for more risk for stroke, DVT. Testosterone treatment is not indicated for fatigue and specifically indicated only for biochemically documented hypogonadism.  - In this particular patient with bilaterally shrunk testes , he will likely require some testosterone replacement therapy although shrinking of the testicles could be a result of supraphysiologic exogenous testosterone supplement.   Given his hyperlipidemia, prediabetes, coronary artery disease, and his intolerance to statins, he is a perfect candidate for lifestyle medicine.  - he acknowledges that there is a room for improvement in his food and drink choices. - Suggestion is made for him to avoid simple carbohydrates  from his diet including  Cakes, Sweet Desserts, Ice Cream, Soda (diet and regular), Sweet Tea, Candies, Chips, Cookies, Store Bought Juices, Alcohol , Artificial Sweeteners,  Coffee Creamer, and "Sugar-free" Products, Lemonade. This will help patient to have more stable blood glucose profile and potentially avoid unintended weight gain.  The following Lifestyle Medicine recommendations according to [place]  [facility]) were discussed and and offered to patient and he  agrees to start the journey:  A. Whole Foods, Plant-Based Nutrition comprising of fruits and vegetables, plant-based proteins, whole-grain carbohydrates was discussed in detail with the patient.   A list for source of those nutrients were also provided to the patient.  Patient will use only water or unsweetened tea for hydration. B.  The need to stay away from risky substances including alcohol, smoking; obtaining 7 to 9 hours of restorative sleep, at least 150 minutes of moderate intensity exercise weekly, the importance of healthy social connections,  and stress management techniques were discussed. C.  A full color page of  Calorie density of various food groups per pound showing examples of each food groups was provided to the patient.  He is advised to maintain close follow-up with his PCP.   I spent 31 minutes in the care of the patient today including review of labs from Thyroid Function, CMP, and other relevant labs ; imaging/biopsy records (current and previous including abstractions from other facilities); face-to-face time discussing  his lab results and symptoms, medications doses, his options of short and long term treatment based on the latest standards of care / guidelines;   and documenting the encounter.  Vassie Loll  On  participated in the discussions, expressed understanding, and voiced agreement with the above plans.  All questions were answered to his satisfaction. he is encouraged to contact clinic should he have  any questions or concerns prior to his return visit.   Return in about 3 months (around 08/31/2022) for Fasting Labs  in AM B4 8.  Glade Lloyd, MD [facility] [address] [place], [place] [address] Phone: [phone]  Fax: [phone]   05/31/2022, 2:44 PM  This note was partially dictated with voice recognition software. Similar sounding words can be transcribed inadequately or may not  be corrected upon review.

## 2022-06-01 LAB — LIPOPROTEIN A (LPA): Lipoprotein (a): 39.5 nmol/L — ABNORMAL HIGH (ref <75.0)

## 2022-06-06 ENCOUNTER — Telehealth: Payer: Self-pay | Admitting: Cardiovascular Disease

## 2022-06-06 NOTE — Telephone Encounter (Signed)
Pt had stints put in 06/01 and pt says that he has been resting for first week, but then started moving around after that a little more. He states that he went to church yesterday and then got in car and stretched and had pain under his rib. He says like a sore spot. He also states he is feeling tired. Requesting call back.

## 2022-06-07 ENCOUNTER — Other Ambulatory Visit (HOSPITAL_COMMUNITY)
Admission: RE | Admit: 2022-06-07 | Discharge: 2022-06-07 | Disposition: A | Discharge status: Home / Self Care | Payer: 59 | Source: Ambulatory Visit | Attending: Student | Admitting: Student

## 2022-06-07 ENCOUNTER — Encounter: Payer: Self-pay | Admitting: Student

## 2022-06-07 ENCOUNTER — Ambulatory Visit: Payer: 59 | Admitting: Student

## 2022-06-07 ENCOUNTER — Telehealth: Payer: Self-pay

## 2022-06-07 VITALS — BP 142/76 | HR 65 | Wt 195.0 lb

## 2022-06-07 DIAGNOSIS — R0609 Other forms of dyspnea: Secondary | ICD-10-CM | POA: Insufficient documentation

## 2022-06-07 DIAGNOSIS — I25118 Atherosclerotic heart disease of native coronary artery with other forms of angina pectoris: Secondary | ICD-10-CM | POA: Diagnosis not present

## 2022-06-07 DIAGNOSIS — E782 Mixed hyperlipidemia: Secondary | ICD-10-CM

## 2022-06-07 DIAGNOSIS — I493 Ventricular premature depolarization: Secondary | ICD-10-CM | POA: Diagnosis not present

## 2022-06-07 DIAGNOSIS — R079 Chest pain, unspecified: Secondary | ICD-10-CM | POA: Diagnosis not present

## 2022-06-07 LAB — CBC
HCT: 48.7 % (ref 39.0–52.0)
Hemoglobin: 16.8 g/dL (ref 13.0–17.0)
MCH: 31.3 pg (ref 26.0–34.0)
MCHC: 34.5 g/dL (ref 30.0–36.0)
MCV: 90.9 fL (ref 80.0–100.0)
Platelets: 209 10*3/uL (ref 150–400)
RBC: 5.36 MIL/uL (ref 4.22–5.81)
RDW: 12.2 % (ref 11.5–15.5)
WBC: 8.3 10*3/uL (ref 4.0–10.5)
nRBC: 0 % (ref 0.0–0.2)

## 2022-06-07 LAB — SEDIMENTATION RATE: Sed Rate: 9 mm/hr (ref 0–16)

## 2022-06-07 LAB — BRAIN NATRIURETIC PEPTIDE: B Natriuretic Peptide: 358 pg/mL — ABNORMAL HIGH (ref 0.0–100.0)

## 2022-06-07 LAB — C-REACTIVE PROTEIN: CRP: 0.9 mg/dL (ref <1.0)

## 2022-06-07 MED ORDER — CLOPIDOGREL BISULFATE 75 MG PO TABS: ORAL_TABLET | ORAL | 3 refills | Status: DC

## 2022-06-07 MED ORDER — FUROSEMIDE 20 MG PO TABS: ORAL_TABLET | ORAL | 0 refills | Status: DC

## 2022-06-07 MED ORDER — CLOPIDOGREL BISULFATE 75 MG PO TABS
75.0000 mg | ORAL_TABLET | Freq: Every day | ORAL | 3 refills | Status: DC
Start: 2022-06-07 — End: 2022-06-07

## 2022-06-07 NOTE — Telephone Encounter (Signed)
Spoke to pt who verbalized understanding. Pt agreeable with medication plan. Pt had no questions or concerns at this time.

## 2022-06-07 NOTE — Progress Notes (Signed)
Cardiology Office Note    Date:  06/07/2022   ID:  Allen, Jeffrey 12-20-1956, MRN [id number]  PCP:  Jamaica Nation, MD  Cardiologist: Rozann Lesches, MD    Chief Complaint  Patient presents with   Follow-up    S/p cardiac cath    History of Present Illness:    Jeffrey Allen is a 66 y.o. male with past medical history of HTN, HLD, prediabetes and prior CVA who presents to the office today for hospital follow-up and evaluation of chest pain.  He most recently presented to [facility] ED on 05/25/2022 for evaluation of chest pain and dizziness and reported worsening fatigue and dyspnea on exertion for the past few months. Troponin values peaked at 54 and EKG showed frequent PVC's and couplets.  Echocardiogram showed his EF was mildly reduced at 50 to 55% with no regional motion abnormalities. He did have grade 2 diastolic dysfunction and normal RV function with only mild MR. Given his symptoms and risk factors, a cardiac catheterization was recommended for definitive evaluation. Cardiac catheterization showed severe three-vessel CAD and he underwent DES placement x1 to the mid/distal LAD, DES placement x1 to D1 and DES placement x2 to the distal LCx.  He also had 50% moderate RCA stenosis with medical management recommended of this. He was started on DAPT with ASA and Brilinta. His LDL was elevated to 171 and he had been intolerant to statin therapy, therefore he was started on Zetia 10 mg daily and referred to the [facility] as an outpatient.  An outpatient monitor was also placed to quantify PVC burden.  He has scheduled follow-up for next week but called the office this morning reporting episodes of chest pain and dyspnea, therefore he was added onto the schedule for today.   In talking with the patient and his wife today, he reports having significant dyspnea with Brilinta during his hospitalization and feels like his dyspnea has continued to worsen since hospital discharge.  Says this can occur at rest or with activity. He denies any specific exertional chest pain but has noted intermittent discomfort which can be a "nagging sensation" and last for hours at a time. He has not taken nitroglycerin. He reports good compliance with Brilinta but did not start ASA 81 mg daily as he was concerned about the bleeding risk. He denies any recent lower extremity edema or abdominal distention but does report some orthopnea.    Past Medical History:  Diagnosis Date   CAD (coronary artery disease)    a. 05/2022: DES placement x1 to the mid/distal LAD, DES placement x1 to D1 and DES placement x2 to the distal LCx   GERD (gastroesophageal reflux disease)    Gout    Obesity (BMI 30.0-34.9)    Peptic ulcer disease    Prediabetes    Stroke [facility]) 06/2017    Past Surgical History:  Procedure Laterality Date   BIOPSY  11/25/2020   Procedure: BIOPSY;  Surgeon: Daneil Dolin, MD;  [facility]: [facility];  Service: Endoscopy;;  gastric   COLONOSCOPY     COLONOSCOPY N/A 12/07/2016   Scattered small and large-mouthed diverticula in sigmoid and descending colon. Six semi-pedunculated polyps in sigmoid and ascending colon, 4-6 mm in size. Distal TI normal appearing. Hyperplastic and tubular adenomas. 3 year surveillance recommended.    COLONOSCOPY N/A 11/25/2020   diverticulosis in sigmoid and descending colon, three 3-4 mm polyps at rectosigmoid colon and hepatic flexure. Hyperplastic polyp. 5 year colonoscopy.  CORONARY STENT INTERVENTION N/A 05/26/2022   Procedure: CORONARY STENT INTERVENTION;  Surgeon: Burnell Blanks, MD;  Location: [place] [facility];  Service: Cardiovascular;  Laterality: N/A;   ESOPHAGOGASTRODUODENOSCOPY N/A 11/25/2020     normal esophagus, portal gastropathy, normal duodenum. Mild chronic gastritis, negative H.pylori.    LEFT HEART CATH AND CORS/GRAFTS ANGIOGRAPHY N/A 05/26/2022   Procedure: LEFT HEART CATH AND CORS/GRAFTS ANGIOGRAPHY;  Surgeon:  Burnell Blanks, MD;  Location: [place] [facility];  Service: Cardiovascular;  Laterality: N/A;   POLYPECTOMY  11/25/2020   Procedure: POLYPECTOMY;  Surgeon: Daneil Dolin, MD;  [facility];  Service: Endoscopy;;   TOOTH EXTRACTION      Current Medications: Outpatient Medications Prior to Visit  Medication Sig Dispense Refill   allopurinol (ZYLOPRIM) 300 MG tablet Take 0.5 tablets (150 mg total) by mouth daily. 30 tablet 1   Ascorbic Acid (VITAMIN C PO) Take by mouth daily.     Cholecalciferol (VITAMIN D3) 125 MCG (5000 UT) TABS Take 10,000 Units by mouth daily.      ezetimibe (ZETIA) 10 MG tablet Take 1 tablet (10 mg total) by mouth daily. 30 tablet 1   gabapentin (NEURONTIN) 100 MG capsule TAKE 1 CAPSULE BY MOUTH THREE TIMES A DAY. (Patient taking differently: daily.) 90 capsule 0   Garlic 2482 MG CAPS Take 1,000 mg by mouth daily.     nitroGLYCERIN (NITROSTAT) 0.4 MG SL tablet Place 1 tablet (0.4 mg total) under the tongue every 5 (five) minutes as needed for chest pain. 25 tablet 2   pantoprazole (PROTONIX) 40 MG tablet Take 1 tablet (40 mg total) by mouth daily. 90 tablet 1   Probiotic Product (PROBIOTIC-10 PO) Take 1 tablet by mouth daily. 18 thousand     Red Yeast Rice Extract (RED YEAST RICE PO) Take 2 tablets by mouth daily.     vitamin B-12 (CYANOCOBALAMIN) 1000 MCG tablet Take 1,000 mcg by mouth daily.     ticagrelor (BRILINTA) 90 MG TABS tablet Take 1 tablet (90 mg total) by mouth 2 (two) times daily. 180 tablet 2   aspirin EC 81 MG EC tablet Take 1 tablet (81 mg total) by mouth daily. (Patient not taking: Reported on 06/07/2022)     ipratropium (ATROVENT) 0.03 % nasal spray Place 2 sprays into both nostrils every 12 (twelve) hours. (Patient not taking: Reported on 06/07/2022) 30 mL 0   No facility-administered medications prior to visit.     Allergies:   Prednisone and Statins   Social History   Socioeconomic History   Marital status: Married     Spouse name: Not on file   Number of children: Not on file   Years of education: Not on file   Highest education level: Not on file  Occupational History   Not on file  Tobacco Use   Smoking status: Former    Packs/day: 0.02    Years: 25.00    Total pack years: 0.50    Types: Cigarettes    Quit date: 12/26/2006    Years since quitting: 15.4   Smokeless tobacco: Never  Vaping Use   Vaping Use: Never used  Substance and Sexual Activity   Alcohol use: Not Currently    Comment: beer twice per week, 2 at a time.    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  Social Determinants of Health   Financial Resource Strain: Not on file  Food Insecurity: Not on file  Transportation Needs: Not on file  Physical Activity: Not on file  Stress: Not on file  Social Connections: Not on file     Family History:  The patient's family history includes COPD in his father; Dementia in his mother; Diabetes in his father; Heart attack in his father; Hyperlipidemia in his father; Hypertension in his father.   Review of Systems:    Please see the history of present illness.     All other systems reviewed and are otherwise negative except as noted above.   Physical Exam:    VS:  BP (!) 142/76   Pulse 65   Wt 195 lb (88.5 kg)   SpO2 98%   BMI 30.54 kg/m    General: Well developed, well nourished,male appearing in no acute distress. Head: Normocephalic, atraumatic. Neck: No carotid bruits. JVD not elevated.  Lungs: Respirations regular and unlabored, without wheezes or rales.  Heart: Regular rate and rhythm. No S3 or S4.  No murmur, no rubs, or gallops appreciated. Abdomen: Appears non-distended. No obvious abdominal masses. Msk:  Strength and tone appear normal for age. No obvious joint deformities or effusions. Extremities: No clubbing or cyanosis. No pitting edema.   Distal pedal pulses are 2+ bilaterally. Neuro: Alert and oriented X 3. Moves all extremities spontaneously. No focal deficits noted. Psych:  Responds to questions appropriately with a normal affect. Skin: No rashes or lesions noted  Wt Readings from Last 3 Encounters:  06/07/22 195 lb (88.5 kg)  05/31/22 194 lb 6.4 oz (88.2 kg)  05/25/22 195 lb 12.3 oz (88.8 kg)     Studies/Labs Reviewed:   EKG:  EKG is ordered today.  The ekg ordered today demonstrates normal sinus rhythm, HR 65 with 1st degree AV block. PVC's present. No acute ST changes.   Recent Labs: 05/24/2022: ALT 19; TSH 1.230 05/27/2022: BUN 15; Creatinine, Ser 0.96; Magnesium 2.0; Potassium 3.6; Sodium 138 06/07/2022: B Natriuretic Peptide 358.0; Hemoglobin 16.8; Platelets 209   Lipid Panel    Component Value Date/Time   CHOL 242 (H) 05/24/2022 0804   TRIG 127 05/24/2022 0804   HDL 48 05/24/2022 0804   CHOLHDL 5.0 05/24/2022 0804   CHOLHDL 5.6 (H) 02/16/2021 0940   VLDL 21 12/30/2019 0002   LDLCALC 171 (H) 05/24/2022 0804   LDLCALC 162 (H) 02/16/2021 0940    Additional studies/ records that were reviewed today include:   LHC: 05/26/2022  Ost RCA to Prox RCA lesion is 50% stenosed.   Mid Cx lesion is 99% stenosed.   Prox Cx lesion is 70% stenosed.   1st Diag lesion is 80% stenosed.   Dist LAD lesion is 90% stenosed.   A drug-eluting stent was successfully placed using a STENT ONYX FRONTIER 2.25X18.   A drug-eluting stent was successfully placed using a STENT ONYX FRONTIER 3.0X12.   A drug-eluting stent was successfully placed using a SYNERGY XD 2.25X16.   A drug-eluting stent was successfully placed using a SYNERGY XD 2.25X12.   Post intervention, there is a 0% residual stenosis.   Post intervention, there is a 0% residual stenosis.   Post intervention, there is a 0% residual stenosis.   Post intervention, there is a 0% residual stenosis.   Severe mid to distal LAD stenosis. Successful PTCA/DES x 1 mid to distal  LAD Severe Diagonal 1 stenosis. Successful PTCA/DES x 1 Diagonal Severe distal Circumflex stenosis. Successful PTCA/DES  x 2 distal Circumflex Moderate ostial RCA stenosis. Large dominant RCA   Recommendations: Continue DAPT with ASA and Brilinta for one year. Continue statin and beta blocker.     Echo: 05/26/2022 IMPRESSIONS     1. Left ventricular ejection fraction, by estimation, is 50 to 55%. The  left ventricle has low normal function. The left ventricle has no regional  wall motion abnormalities. Left ventricular diastolic parameters are  consistent with Grade II diastolic  dysfunction (pseudonormalization). Elevated left atrial pressure.   2. Right ventricular systolic function is normal. The right ventricular  size is normal. Tricuspid regurgitation signal is inadequate for assessing  PA pressure.   3. Left atrial size was moderately dilated.   4. The mitral valve is abnormal. Mild mitral valve regurgitation. No  evidence of mitral stenosis.   5. The aortic valve is tricuspid. Aortic valve regurgitation is trivial.  No aortic stenosis is present.   6. Aortic dilatation noted. There is mild dilatation of the ascending  aorta, measuring 36 mm.   7. The inferior vena cava is normal in size with greater than 50%  respiratory variability, suggesting right atrial pressure of 3 mmHg.   Assessment:    1. Coronary artery disease involving native coronary artery of native heart with other form of angina pectoris (HCC)   2. Chest pain, unspecified type   3. DOE (dyspnea on exertion)   4. Mixed hyperlipidemia   5. PVC (premature ventricular contraction)      Plan:   In order of problems listed above:  1. CAD - He recently underwent DES placement x1 to the mid/distal LAD, DES placement x1 to D1 and DES placement x2 to the distal LCx. He also had 50% moderate RCA stenosis with medical management recommended of this.  - EKG today is without acute ST changes. Given his  significant dyspnea with Brilinta and also due to this not being covered by his insurance, we will stop this and switch to Plavix. I did review with Dr. Audie Box (DOD) and given that he took Brilinta this morning, will start with a Plavix 300 mg load this evening then 75 mg daily. We also reviewed the importance of restarting ASA 81 mg daily. He remains on Zetia 10 mg daily as he was previously intolerant to statin therapy and has not been on a beta-blocker due to baseline bradycardia.   2. Chest pain/Dyspnea - His chest pain sounds atypical for ischemia as it can last for hours at a time and is not associated with activity. It is also not associated with positional changes but will check an ESR and CRP today to rule out possible Dressler syndrome. Given that his EF was mildly reduced by his echocardiogram and reported orthopnea, will also check a BNP. He is not volume overloaded on examination today.  - I suspect his dyspnea will also improve with switching from Brilinta to Plavix as outlined above. If symptoms persist despite medication changes and labs being unrevealing, may need to consider repeat relook catheterization given his multiple stents.   3. HLD - His LDL was elevated to 171 during his admission and he has been intolerant to multiple statins in the past including Atorvastatin and Crestor. He was previously on Red Yeast Rice alone and Zetia 10 mg daily was added to his medication regimen during admission. Will recheck an FLP in 6 weeks but anticipate referral to the [facility] for PCSK9 inhibitor therapy if LDL remains above goal. This was reviewed with the patient  today.   4.  PVC's - He has a history of PVC's but was having more frequent ectopy during his admission. He has not been on AV nodal blocking agents given his baseline bradycardia. He does have a ZIO monitor in place currently and plans to mail this back later today. If found to have a high PVC burden, would likely need to referral  to EP.    Medication Adjustments/Labs and Tests Ordered: Current medicines are reviewed at length with the patient today.  Concerns regarding medicines are outlined above.  Medication changes, Labs and Tests ordered today are listed in the Patient Instructions below. Patient Instructions  Medication Instructions:  Stop Brilinta Start Plavix 75 mg tablets- Take FOUR (4) tablets tonight then one tablet daily  Labwork: CBC BNP ESR CRP In 2 months: Fasting Lipid Panel  Testing/Procedures: None  Follow-Up: Follow up with Dr. Domenic Polite in the [place] office in 2-3 months.   Any Other Special Instructions Will Be Listed Below (If Applicable).     If you need a refill on your cardiac medications before your next appointment, please call your pharmacy.    Signed, Erma Heritage, PA-C  06/07/2022 7:34 PM    [facility] [address] [place], [place] [address] Phone: [phone] Fax: [phone]

## 2022-06-07 NOTE — Patient Instructions (Addendum)
Medication Instructions:  Stop Brilinta Start Plavix 75 mg tablets- Take FOUR (4) tablets tonight then one tablet daily  Labwork: CBC BNP ESR CRP In 2 months: Fasting Lipid Panel  Testing/Procedures: None  Follow-Up: Follow up with Dr. Domenic Polite in the [place] office in 2-3 months.   Any Other Special Instructions Will Be Listed Below (If Applicable).     If you need a refill on your cardiac medications before your next appointment, please call your pharmacy.

## 2022-06-07 NOTE — Telephone Encounter (Signed)
I spoke with patient. He will come into the office today at 3:30 to be seen.

## 2022-06-07 NOTE — Telephone Encounter (Signed)
Patient had PCI 05/27/22  He has scheduled follow up with NP on 06/13/22   I will forward to the DOD, Dr.O'Neal for direction

## 2022-06-07 NOTE — Telephone Encounter (Signed)
Pt c/o of Chest Pain: STAT if CP now or developed within 24 hours  1. Are you having CP right now? no  2. Are you experiencing any other symptoms (ex. SOB, nausea, vomiting, sweating)? Lightheaded, SOB feels winded, does not sound SOB  3. How long have you been experiencing CP? Couple days  4. Is your CP continuous or coming and going? Comes and goes, but was pretty constant yesterday  5. Have you taken Nitroglycerin? no  Patient's wife calling back. She states last night the patient could not lay flat in bed and could not sleep until around 3 am. She states he has been feeling wiped out and winded, but patient does not sound SOB on the phone. She says he told her the pain feels like it is at the bottom of his heart and if he laid on his left side the pain got worse. She says it also hurts when he takes a deep breathe. She says he also had complications with the medications in the hospital. She does not think he needs to go to the ED.

## 2022-06-07 NOTE — Telephone Encounter (Signed)
-----   Message from Erma Heritage, [place] sent at 06/07/2022  3:39 PM EDT ----- Please let the patient know his hemoglobin and platelets are within a normal range.  Inflammatory markers are normal. His fluid level is elevated with BNP at 358. This would help explain his dyspnea at night and having to sit up to help with his symptoms.  Please provide an Rx for Lasix 20 mg for 5 days. Have him report back on symptoms early next week. Follow daily weights during this timeframe to make sure his weight is not trending up. Try to limit sodium intake and keep fluid intake to less than 2 L daily.

## 2022-06-13 ENCOUNTER — Ambulatory Visit: Payer: 59 | Admitting: Cardiology

## 2022-06-17 ENCOUNTER — Telehealth (HOSPITAL_COMMUNITY): Payer: Self-pay

## 2022-06-17 ENCOUNTER — Other Ambulatory Visit (HOSPITAL_COMMUNITY): Payer: Self-pay

## 2022-06-20 ENCOUNTER — Telehealth: Payer: Self-pay

## 2022-06-20 MED ORDER — ACEBUTOLOL HCL 200 MG PO CAPS: 200.0000 mg | ORAL_CAPSULE | Freq: Every day | ORAL | 6 refills | Status: DC

## 2022-06-22 ENCOUNTER — Telehealth: Payer: Self-pay | Admitting: Cardiology

## 2022-06-22 DIAGNOSIS — Z8673 Personal history of transient ischemic attack (TIA), and cerebral infarction without residual deficits: Secondary | ICD-10-CM | POA: Diagnosis not present

## 2022-06-22 DIAGNOSIS — T466X5A Adverse effect of antihyperlipidemic and antiarteriosclerotic drugs, initial encounter: Secondary | ICD-10-CM | POA: Diagnosis not present

## 2022-06-22 DIAGNOSIS — Z6830 Body mass index (BMI) 30.0-30.9, adult: Secondary | ICD-10-CM | POA: Diagnosis not present

## 2022-06-22 DIAGNOSIS — K219 Gastro-esophageal reflux disease without esophagitis: Secondary | ICD-10-CM | POA: Diagnosis not present

## 2022-06-22 DIAGNOSIS — E7801 Familial hypercholesterolemia: Secondary | ICD-10-CM | POA: Diagnosis not present

## 2022-06-22 DIAGNOSIS — Z8739 Personal history of other diseases of the musculoskeletal system and connective tissue: Secondary | ICD-10-CM | POA: Diagnosis not present

## 2022-06-22 DIAGNOSIS — G720 Drug-induced myopathy: Secondary | ICD-10-CM | POA: Diagnosis not present

## 2022-06-22 DIAGNOSIS — I2510 Atherosclerotic heart disease of native coronary artery without angina pectoris: Secondary | ICD-10-CM | POA: Diagnosis not present

## 2022-06-22 NOTE — Telephone Encounter (Signed)
I will defer to PharmD. He has been on medication since 06/20/22

## 2022-06-22 NOTE — Telephone Encounter (Signed)
Pt c/o medication issue:  1. Name of Medication: acebutolol (SECTRAL) 200 MG capsule  2. How are you currently taking this medication (dosage and times per day)? Took 1 tablet for the first time yesterday  3. Are you having a reaction (difficulty breathing--STAT)? Yes  4. What is your medication issue? Patient states after taking this medication it caused him a severe headache yesterday and today he has extreme fatigue. Please advise.

## 2022-06-22 NOTE — Telephone Encounter (Signed)
Headache is noted with about 6% of people who take acebutolol.  Fatigue is not an uncommon issue when starting beta blockers.  It usually lasts about 5-10 days.  We usually ask that they try the medication for 10 days, as most side effects are transient.

## 2022-06-23 ENCOUNTER — Emergency Department (HOSPITAL_COMMUNITY): Payer: 59

## 2022-06-23 ENCOUNTER — Inpatient Hospital Stay (HOSPITAL_COMMUNITY): Payer: 59

## 2022-06-23 ENCOUNTER — Other Ambulatory Visit (HOSPITAL_COMMUNITY): Payer: Self-pay | Admitting: *Deleted

## 2022-06-23 ENCOUNTER — Inpatient Hospital Stay (HOSPITAL_COMMUNITY)
Admission: EM | Admit: 2022-06-23 | Discharge: 2022-06-25 | DRG: 291 | Disposition: A | Discharge status: Home / Self Care | Payer: 59 | Attending: Family Medicine | Admitting: Family Medicine

## 2022-06-23 ENCOUNTER — Other Ambulatory Visit: Payer: Self-pay

## 2022-06-23 ENCOUNTER — Encounter (HOSPITAL_COMMUNITY): Payer: Self-pay | Admitting: *Deleted

## 2022-06-23 DIAGNOSIS — I10 Essential (primary) hypertension: Secondary | ICD-10-CM | POA: Diagnosis present

## 2022-06-23 DIAGNOSIS — K219 Gastro-esophageal reflux disease without esophagitis: Secondary | ICD-10-CM | POA: Diagnosis present

## 2022-06-23 DIAGNOSIS — Z833 Family history of diabetes mellitus: Secondary | ICD-10-CM | POA: Diagnosis not present

## 2022-06-23 DIAGNOSIS — Z7902 Long term (current) use of antithrombotics/antiplatelets: Secondary | ICD-10-CM

## 2022-06-23 DIAGNOSIS — I5043 Acute on chronic combined systolic (congestive) and diastolic (congestive) heart failure: Secondary | ICD-10-CM | POA: Diagnosis present

## 2022-06-23 DIAGNOSIS — Z83438 Family history of other disorder of lipoprotein metabolism and other lipidemia: Secondary | ICD-10-CM

## 2022-06-23 DIAGNOSIS — Z7982 Long term (current) use of aspirin: Secondary | ICD-10-CM | POA: Diagnosis not present

## 2022-06-23 DIAGNOSIS — R001 Bradycardia, unspecified: Secondary | ICD-10-CM | POA: Diagnosis present

## 2022-06-23 DIAGNOSIS — I509 Heart failure, unspecified: Secondary | ICD-10-CM | POA: Diagnosis not present

## 2022-06-23 DIAGNOSIS — T466X5A Adverse effect of antihyperlipidemic and antiarteriosclerotic drugs, initial encounter: Secondary | ICD-10-CM | POA: Diagnosis not present

## 2022-06-23 DIAGNOSIS — Z86010 Personal history of colonic polyps: Secondary | ICD-10-CM | POA: Diagnosis not present

## 2022-06-23 DIAGNOSIS — Z6830 Body mass index (BMI) 30.0-30.9, adult: Secondary | ICD-10-CM | POA: Diagnosis not present

## 2022-06-23 DIAGNOSIS — Z8711 Personal history of peptic ulcer disease: Secondary | ICD-10-CM

## 2022-06-23 DIAGNOSIS — Z8616 Personal history of COVID-19: Secondary | ICD-10-CM | POA: Diagnosis not present

## 2022-06-23 DIAGNOSIS — I110 Hypertensive heart disease with heart failure: Principal | ICD-10-CM | POA: Diagnosis present

## 2022-06-23 DIAGNOSIS — R079 Chest pain, unspecified: Secondary | ICD-10-CM

## 2022-06-23 DIAGNOSIS — Z955 Presence of coronary angioplasty implant and graft: Secondary | ICD-10-CM

## 2022-06-23 DIAGNOSIS — E669 Obesity, unspecified: Secondary | ICD-10-CM | POA: Diagnosis not present

## 2022-06-23 DIAGNOSIS — R7303 Prediabetes: Secondary | ICD-10-CM | POA: Diagnosis not present

## 2022-06-23 DIAGNOSIS — E7800 Pure hypercholesterolemia, unspecified: Secondary | ICD-10-CM | POA: Diagnosis not present

## 2022-06-23 DIAGNOSIS — M791 Myalgia, unspecified site: Secondary | ICD-10-CM | POA: Diagnosis present

## 2022-06-23 DIAGNOSIS — Z8249 Family history of ischemic heart disease and other diseases of the circulatory system: Secondary | ICD-10-CM | POA: Diagnosis not present

## 2022-06-23 DIAGNOSIS — I25118 Atherosclerotic heart disease of native coronary artery with other forms of angina pectoris: Secondary | ICD-10-CM | POA: Diagnosis not present

## 2022-06-23 DIAGNOSIS — Z8673 Personal history of transient ischemic attack (TIA), and cerebral infarction without residual deficits: Secondary | ICD-10-CM | POA: Diagnosis not present

## 2022-06-23 DIAGNOSIS — I493 Ventricular premature depolarization: Secondary | ICD-10-CM | POA: Diagnosis not present

## 2022-06-23 DIAGNOSIS — I440 Atrioventricular block, first degree: Secondary | ICD-10-CM | POA: Diagnosis not present

## 2022-06-23 DIAGNOSIS — Z825 Family history of asthma and other chronic lower respiratory diseases: Secondary | ICD-10-CM

## 2022-06-23 DIAGNOSIS — I5023 Acute on chronic systolic (congestive) heart failure: Secondary | ICD-10-CM | POA: Diagnosis not present

## 2022-06-23 DIAGNOSIS — R0789 Other chest pain: Secondary | ICD-10-CM | POA: Diagnosis not present

## 2022-06-23 DIAGNOSIS — M109 Gout, unspecified: Secondary | ICD-10-CM | POA: Diagnosis present

## 2022-06-23 DIAGNOSIS — Z79899 Other long term (current) drug therapy: Secondary | ICD-10-CM

## 2022-06-23 DIAGNOSIS — Z87891 Personal history of nicotine dependence: Secondary | ICD-10-CM

## 2022-06-23 DIAGNOSIS — K2950 Unspecified chronic gastritis without bleeding: Secondary | ICD-10-CM | POA: Diagnosis present

## 2022-06-23 DIAGNOSIS — I2510 Atherosclerotic heart disease of native coronary artery without angina pectoris: Secondary | ICD-10-CM

## 2022-06-23 DIAGNOSIS — R109 Unspecified abdominal pain: Secondary | ICD-10-CM | POA: Diagnosis not present

## 2022-06-23 DIAGNOSIS — R0602 Shortness of breath: Secondary | ICD-10-CM | POA: Diagnosis not present

## 2022-06-23 DIAGNOSIS — I472 Ventricular tachycardia, unspecified: Secondary | ICD-10-CM | POA: Diagnosis not present

## 2022-06-23 HISTORY — DX: Pure hypercholesterolemia, unspecified: E78.00

## 2022-06-23 LAB — COMPREHENSIVE METABOLIC PANEL
ALT: 25 U/L (ref 0–44)
AST: 20 U/L (ref 15–41)
Albumin: 3.9 g/dL (ref 3.5–5.0)
Alkaline Phosphatase: 48 U/L (ref 38–126)
Anion gap: 9 (ref 5–15)
BUN: 23 mg/dL (ref 8–23)
CO2: 26 mmol/L (ref 22–32)
Calcium: 8.8 mg/dL — ABNORMAL LOW (ref 8.9–10.3)
Chloride: 105 mmol/L (ref 98–111)
Creatinine, Ser: 0.91 mg/dL (ref 0.61–1.24)
GFR, Estimated: >60 mL/min (ref >60)
Glucose, Bld: 92 mg/dL (ref 70–99)
Potassium: 3.7 mmol/L (ref 3.5–5.1)
Sodium: 140 mmol/L (ref 135–145)
Total Bilirubin: 0.9 mg/dL (ref 0.3–1.2)
Total Protein: 6.7 g/dL (ref 6.5–8.1)

## 2022-06-23 LAB — ECHOCARDIOGRAM LIMITED
Calc EF: 47.4 %
Height: 67 in
S' Lateral: 2.9 cm
Single Plane A2C EF: 44.9 %
Single Plane A4C EF: 49.9 %
Weight: 3062.4 oz

## 2022-06-23 LAB — CBC WITH DIFFERENTIAL/PLATELET
Abs Immature Granulocytes: 0.04 10*3/uL (ref 0.00–0.07)
Basophils Absolute: 0.1 10*3/uL (ref 0.0–0.1)
Basophils Relative: 1 %
Eosinophils Absolute: 0.2 10*3/uL (ref 0.0–0.5)
Eosinophils Relative: 3 %
HCT: 45.3 % (ref 39.0–52.0)
Hemoglobin: 15.4 g/dL (ref 13.0–17.0)
Immature Granulocytes: 1 %
Lymphocytes Relative: 20 %
Lymphs Abs: 1.4 10*3/uL (ref 0.7–4.0)
MCH: 31.5 pg (ref 26.0–34.0)
MCHC: 34 g/dL (ref 30.0–36.0)
MCV: 92.6 fL (ref 80.0–100.0)
Monocytes Absolute: 0.6 10*3/uL (ref 0.1–1.0)
Monocytes Relative: 8 %
Neutro Abs: 4.7 10*3/uL (ref 1.7–7.7)
Neutrophils Relative %: 67 %
Platelets: 157 10*3/uL (ref 150–400)
RBC: 4.89 MIL/uL (ref 4.22–5.81)
RDW: 12.5 % (ref 11.5–15.5)
WBC: 7 10*3/uL (ref 4.0–10.5)
nRBC: 0 % (ref 0.0–0.2)

## 2022-06-23 LAB — LIPASE, BLOOD: Lipase: 44 U/L (ref 11–51)

## 2022-06-23 LAB — BRAIN NATRIURETIC PEPTIDE: B Natriuretic Peptide: 293 pg/mL — ABNORMAL HIGH (ref 0.0–100.0)

## 2022-06-23 LAB — TROPONIN I (HIGH SENSITIVITY)
Troponin I (High Sensitivity): 89 ng/L — ABNORMAL HIGH (ref <18)
Troponin I (High Sensitivity): 83 ng/L — ABNORMAL HIGH (ref <18)

## 2022-06-23 MED ORDER — HEPARIN SODIUM (PORCINE) 5000 UNIT/ML IJ SOLN
5000.0000 [IU] | Freq: Three times a day (TID) | INTRAMUSCULAR | Status: DC
  Administered 2022-06-23 – 2022-06-25 (×6): 5000 [IU] via SUBCUTANEOUS
  Filled 2022-06-23 (×6): qty 1

## 2022-06-23 MED ORDER — SODIUM CHLORIDE 0.9% FLUSH: 3.0000 mL | INTRAVENOUS | Status: DC | PRN

## 2022-06-23 MED ORDER — ONDANSETRON HCL 4 MG PO TABS: 4.0000 mg | ORAL_TABLET | Freq: Four times a day (QID) | ORAL | Status: DC | PRN

## 2022-06-23 MED ORDER — FUROSEMIDE 10 MG/ML IJ SOLN
40.0000 mg | Freq: Once | INTRAMUSCULAR | Status: AC
  Administered 2022-06-23: 40 mg via INTRAVENOUS
  Filled 2022-06-23: qty 4

## 2022-06-23 MED ORDER — ASPIRIN 81 MG PO TBEC
81.0000 mg | DELAYED_RELEASE_TABLET | Freq: Every day | ORAL | Status: DC
  Administered 2022-06-23 – 2022-06-25 (×3): 81 mg via ORAL
  Filled 2022-06-23 (×3): qty 1

## 2022-06-23 MED ORDER — PANTOPRAZOLE SODIUM 40 MG IV SOLR
40.0000 mg | Freq: Once | INTRAVENOUS | Status: AC
  Administered 2022-06-23: 40 mg via INTRAVENOUS
  Filled 2022-06-23: qty 10

## 2022-06-23 MED ORDER — CLOPIDOGREL BISULFATE 75 MG PO TABS
75.0000 mg | ORAL_TABLET | Freq: Every day | ORAL | Status: DC
  Administered 2022-06-23 – 2022-06-25 (×3): 75 mg via ORAL
  Filled 2022-06-23 (×3): qty 1

## 2022-06-23 MED ORDER — POLYETHYLENE GLYCOL 3350 17 G PO PACK: 17.0000 g | PACK | Freq: Every day | ORAL | Status: DC | PRN

## 2022-06-23 MED ORDER — ACETAMINOPHEN 325 MG PO TABS: 650.0000 mg | ORAL_TABLET | Freq: Four times a day (QID) | ORAL | Status: DC | PRN

## 2022-06-23 MED ORDER — SODIUM CHLORIDE 0.9% FLUSH
3.0000 mL | Freq: Two times a day (BID) | INTRAVENOUS | Status: DC
  Administered 2022-06-23 – 2022-06-25 (×5): 3 mL via INTRAVENOUS

## 2022-06-23 MED ORDER — NITROGLYCERIN 0.4 MG SL SUBL
0.4000 mg | SUBLINGUAL_TABLET | Freq: Once | SUBLINGUAL | Status: AC
  Administered 2022-06-23: 0.4 mg via SUBLINGUAL
  Filled 2022-06-23: qty 1

## 2022-06-23 MED ORDER — FUROSEMIDE 10 MG/ML IJ SOLN
40.0000 mg | Freq: Two times a day (BID) | INTRAMUSCULAR | Status: DC
  Administered 2022-06-23 – 2022-06-24 (×2): 40 mg via INTRAVENOUS
  Filled 2022-06-23 (×2): qty 4

## 2022-06-23 MED ORDER — ONDANSETRON HCL 4 MG/2ML IJ SOLN: 4.0000 mg | Freq: Four times a day (QID) | INTRAMUSCULAR | Status: DC | PRN

## 2022-06-23 MED ORDER — SODIUM CHLORIDE 0.9% FLUSH: 3.0000 mL | Freq: Two times a day (BID) | INTRAVENOUS | Status: DC

## 2022-06-23 MED ORDER — GABAPENTIN 100 MG PO CAPS
100.0000 mg | ORAL_CAPSULE | Freq: Every day | ORAL | Status: DC
  Administered 2022-06-23 – 2022-06-24 (×2): 100 mg via ORAL
  Filled 2022-06-23 (×3): qty 1

## 2022-06-23 MED ORDER — BISACODYL 10 MG RE SUPP: 10.0000 mg | Freq: Every day | RECTAL | Status: DC | PRN

## 2022-06-23 MED ORDER — AMIODARONE HCL 200 MG PO TABS
400.0000 mg | ORAL_TABLET | Freq: Two times a day (BID) | ORAL | Status: DC
  Administered 2022-06-23 – 2022-06-25 (×5): 400 mg via ORAL
  Filled 2022-06-23 (×5): qty 2

## 2022-06-23 MED ORDER — ORAL CARE MOUTH RINSE
15.0000 mL | OROMUCOSAL | Status: DC | PRN
Start: 2022-06-23 — End: 2022-06-25

## 2022-06-23 MED ORDER — SODIUM CHLORIDE 0.9 % IV SOLN: INTRAVENOUS | Status: DC | PRN

## 2022-06-23 MED ORDER — PANTOPRAZOLE SODIUM 40 MG PO TBEC
40.0000 mg | DELAYED_RELEASE_TABLET | Freq: Every day | ORAL | Status: DC
  Administered 2022-06-23 – 2022-06-25 (×3): 40 mg via ORAL
  Filled 2022-06-23 (×3): qty 1

## 2022-06-23 MED ORDER — EZETIMIBE 10 MG PO TABS
10.0000 mg | ORAL_TABLET | Freq: Every day | ORAL | Status: DC
  Administered 2022-06-23 – 2022-06-24 (×3): 10 mg via ORAL
  Filled 2022-06-23 (×3): qty 1

## 2022-06-23 MED ORDER — ACETAMINOPHEN 650 MG RE SUPP: 650.0000 mg | Freq: Four times a day (QID) | RECTAL | Status: DC | PRN

## 2022-06-23 NOTE — Telephone Encounter (Signed)
Pt's wife reports that pt is currently in the ED. Wife was notified of pharmacist note.

## 2022-06-23 NOTE — Consult Note (Signed)
Cardiology Consultation:   Patient ID: Jeffrey Allen MRN: [id number]; DOB: [birth year]  Admit date: 06/23/2022 Date of Consult: 06/23/2022  PCP:  Magnolia Nation, MD   [facility] Providers Cardiologist:  Rozann Lesches, MD        Patient Profile:   Jeffrey Allen is a 66 y.o. male with a hx of CAD (s/p cath on 05/26/2022 showing severe 3-vessel CAD with DESx1 to mid/distal LAD, DESx1 to D1 and DESx2 to distal LCx), HTN, HLD, prediabetes and prior CVA who is being seen 06/23/2022 for the evaluation of chest pain and elevated troponin values at the request of Dr. Roderic Palau.  History of Present Illness:   Jeffrey Allen was most recently admitted to [facility] on 05/25/2022 for evaluation of fatigue and dyspnea on exertion. He was transferred to [facility]  and underwent a cardiac catheterization which showed three-vessel CAD and received multiple stents as outlined above. He did have frequent PVC's during admission and outpatient monitor was placed to assess his PVC burden. He did follow-up with myself on 06/07/2022 and reported having significant dyspnea with Brilinta during his hospitalization and felt like his dyspnea had continued to worsen at home. He denied any exertional chest pain. He had not been taking ASA 81 mg daily and was encouraged to start this. Given his intolerances to Brilinta, this was reviewed with the DOD and was recommended to start Plavix with a 300 mg load followed by 75 mg daily. He did report orthopnea and BNP was elevated to 358, therefore he was given an Rx for Lasix 20 mg to take for 5 days and report back if symptoms did not improve. His monitor did result last week and showed a PVC burden of 17.1% and multiple episodes of NSVT. Dr. Domenic Polite did recommend starting Acebutolol 200 mg daily.  He presented to [facility] ED this morning for evaluation of abdominal pain, dyspnea and dizziness. In talking with the patient and his wife today, he reports his dyspnea improved after  being switched from Brilinta to Plavix but over the past several days he has developed worsening dyspnea on exertion along with orthopnea. Says his weight has also increased by 5 pounds and he has noticed abdominal distention. His wife reports his appetite has decreased despite his weight increasing. He reports discomfort along his epigastric region which has been constant since yesterday. No change with exertion. Says this developed after he took Acebutolol and he had a headache throughout the entire day after taking the medication.  Initial labs showed WBC 7.0, Hgb 15.4, platelets 157, Na+ 140, K+ 3.7 and creatinine 0.91. Initial Hs Troponin 89 with repeat pending. CXR with no acute cardiopulmonary findings. EKG shows NSR, HR 73 with ventricular bigeminy. No acute ST changes.    Past Medical History:  Diagnosis Date   CAD (coronary artery disease)    a. 05/2022: DES placement x1 to the mid/distal LAD, DES placement x1 to D1 and DES placement x2 to the distal LCx   GERD (gastroesophageal reflux disease)    Gout    High cholesterol    Obesity (BMI 30.0-34.9)    Peptic ulcer disease    Prediabetes    Stroke [facility]) 06/2017    Past Surgical History:  Procedure Laterality Date   BIOPSY  11/25/2020   Procedure: BIOPSY;  Surgeon: Daneil Dolin, MD;  Location: [facility];  Service: Endoscopy;;  gastric   COLONOSCOPY     COLONOSCOPY N/A 12/07/2016   Scattered small and large-mouthed diverticula  in sigmoid and descending colon. Six semi-pedunculated polyps in sigmoid and ascending colon, 4-6 mm in size. Distal TI normal appearing. Hyperplastic and tubular adenomas. 3 year surveillance recommended.    COLONOSCOPY N/A 11/25/2020   diverticulosis in sigmoid and descending colon, three 3-4 mm polyps at rectosigmoid colon and hepatic flexure. Hyperplastic polyp. 5 year colonoscopy.    CORONARY STENT INTERVENTION N/A 05/26/2022   Procedure: CORONARY STENT INTERVENTION;  Surgeon: Burnell Blanks, MD;  Location: [place] [facility];  Service: Cardiovascular;  Laterality: N/A;   ESOPHAGOGASTRODUODENOSCOPY N/A 11/25/2020     normal esophagus, portal gastropathy, normal duodenum. Mild chronic gastritis, negative H.pylori.    LEFT HEART CATH AND CORS/GRAFTS ANGIOGRAPHY N/A 05/26/2022   Procedure: LEFT HEART CATH AND CORS/GRAFTS ANGIOGRAPHY;  Surgeon: Burnell Blanks, MD;  Location: [place] [facility];  Service: Cardiovascular;  Laterality: N/A;   POLYPECTOMY  11/25/2020   Procedure: POLYPECTOMY;  Surgeon: Daneil Dolin, MD;  Location: [facility];  Service: Endoscopy;;   TOOTH EXTRACTION       Home Medications:  Prior to Admission medications   Medication Sig Start Date End Date Taking? Authorizing Provider  acebutolol (SECTRAL) 200 MG capsule Take 1 capsule (200 mg total) by mouth daily. 06/20/22   [name], MD  allopurinol (ZYLOPRIM) 300 MG tablet Take 0.5 tablets (150 mg total) by mouth daily. 05/18/21   Doree Albee, MD  Ascorbic Acid (VITAMIN C PO) Take by mouth daily.    [provider]  aspirin EC 81 MG EC tablet Take 1 tablet (81 mg total) by mouth daily. Patient not taking: Reported on 06/07/2022 01/01/20   Orson Eva, MD  Cholecalciferol (VITAMIN D3) 125 MCG (5000 UT) TABS Take 10,000 Units by mouth daily.     [provider]  clopidogrel (PLAVIX) 75 MG tablet Take 4 tablets ('300mg'$  total) today followed by 1 tablet ('75mg'$ ) daily. 06/07/22   Jakeya Gherardi, Fransisco Hertz, PA-C  ezetimibe (ZETIA) 10 MG tablet Take 1 tablet (10 mg total) by mouth daily. 05/28/22   Cheryln Manly, NP  furosemide (LASIX) 20 MG tablet Take 1 tablet by mouth in the morning for 5 days. 06/07/22   Ifeanyi Mickelson, Fransisco Hertz, PA-C  gabapentin (NEURONTIN) 100 MG capsule TAKE 1 CAPSULE BY MOUTH THREE TIMES A DAY. Patient taking differently: daily. 09/22/21   Lindell Spar, MD  Garlic 1950 MG CAPS Take 1,000 mg by mouth daily.    [provider]  ipratropium  (ATROVENT) 0.03 % nasal spray Place 2 sprays into both nostrils every 12 (twelve) hours. Patient not taking: Reported on 06/07/2022 05/16/22   Leath-Warren, Alda Lea, NP  nitroGLYCERIN (NITROSTAT) 0.4 MG SL tablet Place 1 tablet (0.4 mg total) under the tongue every 5 (five) minutes as needed for chest pain. 05/27/22   Cheryln Manly, NP  pantoprazole (PROTONIX) 40 MG tablet Take 1 tablet (40 mg total) by mouth daily. 07/14/21   Doree Albee, MD  Probiotic Product (PROBIOTIC-10 PO) Take 1 tablet by mouth daily. 18 thousand    [provider]  Red Yeast Rice Extract (RED YEAST RICE PO) Take 2 tablets by mouth daily.    [provider]  vitamin B-12 (CYANOCOBALAMIN) 1000 MCG tablet Take 1,000 mcg by mouth daily.    [provider]    Inpatient Medications: Scheduled Meds:  Continuous Infusions:  PRN Meds:   Allergies:    Allergies  Allergen Reactions   Prednisone Other (See Comments)  Peptic ulcers     Statins     Social History:   Social History   Socioeconomic History   Marital status: Married    Spouse name: Not on file   Number of children: Not on file   Years of education: Not on file   Highest education level: Not on file  Occupational History   Not on file  Tobacco Use   Smoking status: Former    Packs/day: 0.02    Years: 25.00    Total pack years: 0.50    Types: Cigarettes    Quit date: 12/26/2006    Years since quitting: 15.5   Smokeless tobacco: Never  Vaping Use   Vaping Use: Never used  Substance and Sexual Activity   Alcohol use: Not Currently   Drug use: Yes    Types: Marijuana   Sexual activity: Yes  Other Topics Concern   Not on file  Social History Narrative   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   Social Determinants of Health   Financial Resource Strain: Not on file  Food Insecurity: Not on file  Transportation Needs: Not on file  Physical Activity: Not on file  Stress: Not on file   Social Connections: Not on file  Intimate Partner Violence: Not on file    Family History:    Family History  Problem Relation Age of Onset   Dementia Mother    Hypertension Father    Diabetes Father    COPD Father    Hyperlipidemia Father    Heart attack Father    Colon cancer Neg Hx      ROS:  Please see the history of present illness.   All other ROS reviewed and negative.     Physical Exam/Data:   Vitals:   06/23/22 0745 06/23/22 0830 06/23/22 0900 06/23/22 0930  BP: 139/82 114/66 118/78 118/72  Pulse: 62 (!) 55 64 62  Resp: '15 14 12 16  '$ Temp: 97.9 F (36.6 C)     TempSrc: Oral     SpO2: 97% 95% 98% 98%  Weight:      Height:       No intake or output data in the 24 hours ending 06/23/22 0943    06/23/2022    7:42 AM 06/07/2022   11:28 AM 05/31/2022    9:23 AM  Last 3 Weights  Weight (lbs) 195 lb 195 lb 194 lb 6.4 oz  Weight (kg) 88.451 kg 88.451 kg 88.179 kg     Body mass index is 30.54 kg/m.  General:  Well nourished, well developed male appearing in no acute distress. HEENT: normal Neck: JVD elevated.  Vascular: No carotid bruits; Distal pulses 2+ bilaterally Cardiac:  normal S1, S2; RRR with frequent ectopic beats.   Lungs:  clear to auscultation bilaterally, no wheezing, rhonchi or rales  Abd: appears mildly distended.  Ext: no pitting edema Musculoskeletal:  No deformities, BUE and BLE strength normal and equal Skin: warm and dry  Neuro:  CNs 2-12 intact, no focal abnormalities noted Psych:  Normal affect   EKG:  The EKG was personally reviewed and demonstrates: NSR, HR 73 with ventricular bigeminy. No acute ST changes.   Telemetry:  Telemetry was personally reviewed and demonstrates: NSR, HR in 60's with frequent PVC's, ventricular bigeminy and couplets.   Relevant CV Studies:  Echocardiogram: 05/26/2022 IMPRESSIONS     1. Left ventricular ejection fraction, by estimation, is 50 to 55%. The  left ventricle has low normal  function. The  left ventricle has no regional  wall motion abnormalities. Left ventricular diastolic parameters are  consistent with Grade II diastolic  dysfunction (pseudonormalization). Elevated left atrial pressure.   2. Right ventricular systolic function is normal. The right ventricular  size is normal. Tricuspid regurgitation signal is inadequate for assessing  PA pressure.   3. Left atrial size was moderately dilated.   4. The mitral valve is abnormal. Mild mitral valve regurgitation. No  evidence of mitral stenosis.   5. The aortic valve is tricuspid. Aortic valve regurgitation is trivial.  No aortic stenosis is present.   6. Aortic dilatation noted. There is mild dilatation of the ascending  aorta, measuring 36 mm.   7. The inferior vena cava is normal in size with greater than 50%  respiratory variability, suggesting right atrial pressure of 3 mmHg.   LHC: 05/26/2022   Ost RCA to Prox RCA lesion is 50% stenosed.   Mid Cx lesion is 99% stenosed.   Prox Cx lesion is 70% stenosed.   1st Diag lesion is 80% stenosed.   Dist LAD lesion is 90% stenosed.   A drug-eluting stent was successfully placed using a STENT ONYX FRONTIER 2.25X18.   A drug-eluting stent was successfully placed using a STENT ONYX FRONTIER 3.0X12.   A drug-eluting stent was successfully placed using a SYNERGY XD 2.25X16.   A drug-eluting stent was successfully placed using a SYNERGY XD 2.25X12.   Post intervention, there is a 0% residual stenosis.   Post intervention, there is a 0% residual stenosis.   Post intervention, there is a 0% residual stenosis.   Post intervention, there is a 0% residual stenosis.   Severe mid to distal LAD stenosis. Successful PTCA/DES x 1 mid to distal LAD Severe Diagonal 1 stenosis. Successful PTCA/DES x 1 Diagonal Severe distal Circumflex stenosis. Successful PTCA/DES x 2 distal Circumflex Moderate ostial RCA stenosis. Large dominant RCA   Recommendations: Continue DAPT with ASA and Brilinta  for one year. Continue statin and beta blocker.   Event Monitor: 05/2022 ZIO AT reviewed.  11 days, 2 hours analyzed.   1.  Predominant rhythm is sinus with prolonged PR interval, heart rate ranging from 36 bpm up to 122 bpm with average heart rate 71 bpm. 2.  There were occasional PACs representing 2.7% total beats with otherwise rare atrial couplets and triplets representing less than 1% total beats. 3.  There were frequent PVCs representing 17.1% total beats with occasional ventricular couplets representing 4.8% total beats, and otherwise rare ventricular triplets representing less than 1% total beats.  Ventricular bigeminy and trigeminy were also noted. 4.  Multiple episodes of NSVT were noted.  Allen episode was described at 22 seconds, however looks to be more of a combination of fusion beats with more brief episodes of NSVT, rather than a sustained event. 5.  There were 10 episodes of PSVT, the Allen of which was 16 beats. 6.  Patient triggered episodes loosely correlated with PVCs.   Laboratory Data:  High Sensitivity Troponin:   Recent Labs  Lab 05/25/22 1603 05/25/22 1751 05/26/22 0130 06/23/22 0742  TROPONINIHS 54* 50* 48* 89*     Chemistry Recent Labs  Lab 06/23/22 0742  NA 140  K 3.7  CL 105  CO2 26  GLUCOSE 92  BUN 23  CREATININE 0.91  CALCIUM 8.8*  GFRNONAA >60  ANIONGAP 9    Recent Labs  Lab 06/23/22 0742  PROT 6.7  ALBUMIN 3.9  AST 20  ALT 25  ALKPHOS 48  BILITOT 0.9   Lipids No results for input(s): "CHOL", "TRIG", "HDL", "LABVLDL", "LDLCALC", "CHOLHDL" in the last 168 hours.  Hematology Recent Labs  Lab 06/23/22 0742  WBC 7.0  RBC 4.89  HGB 15.4  HCT 45.3  MCV 92.6  MCH 31.5  MCHC 34.0  RDW 12.5  PLT 157   Thyroid No results for input(s): "TSH", "FREET4" in the last 168 hours.  BNPNo results for input(s): "BNP", "PROBNP" in the last 168 hours.  DDimer No results for input(s): "DDIMER" in the last 168  hours.   Radiology/Studies:  DG Chest Port 1 View  Result Date: 06/23/2022 CLINICAL DATA:  Mid upper abdominal pain since yesterday shortness of breath, lightheadedness EXAM: PORTABLE CHEST 1 VIEW COMPARISON:  Chest radiograph 05/26/2019 FINDINGS: The cardiomediastinal silhouette is stable. There is no focal consolidation or pulmonary edema. There is no pleural effusion or pneumothorax There is no acute osseous abnormality. IMPRESSION: No radiographic evidence of acute cardiopulmonary process. Electronically Signed   By: Valetta Mole M.D.   On: 06/23/2022 08:28     Assessment and Plan:   1. Acute HFpEF - He presents with worsening dyspnea and abdominal distention in the setting of a 5 pound weight gain. Echocardiogram earlier this month showed his EF was at 50 to 55% but there is concern that this has further declined given his PVC burden. - Will add on a BNP to his morning labs. Repeat limited echocardiogram for reassessment of his EF and wall motion. Will order IV Lasix '40mg'$  x1 and assess response.   2. CAD/Elevated Troponin Values - He is s/p cath on 05/26/2022 showing severe 3-vessel CAD with DESx1 to mid/distal LAD, DESx1 to D1 and DESx2 to distal LCx. He reports epigastric pain which has been constant for over 24 hours and seems atypical for angina. - Initial Hs Troponin 89 with repeat of 83. Suspect this secondary to demand ischemia in the setting of CHF exacerbation. If his EF has further declined or symptoms do not improve with diuresis, may need to consider a relook catheterization given his stent burden.  - Continue ASA '81mg'$  daily, Plavix '75mg'$  daily and Zetia '10mg'$  daily (intolerant to statins). No longer on a beta-blocker due to baseline bradycardia.   3. PVC's - Recent monitor showed a 17.1% PVC burden and he was noted to have multiple episodes of NSVT as outlined above. Given his intolerances to beta-blocker therapy and baseline heart rate in the 50's, Dr. Debara Pickett has recommended  starting him on Amiodarone 400 mg twice daily for PVC suppression.   4. HLD - LDL was elevated to 171 when checked earlier this month and he was started on Zetia at that time but I doubt this will get him to his goal. Plans are for a repeat FLP in 6 weeks with ultimate plans to refer to the [facility] for PCSK9 inhibitor therapy.   For questions or updates, please contact [place] Please consult [email or web address] for contact info under    Signed, Erma Heritage, PA-C  06/23/2022 9:43 AM

## 2022-06-23 NOTE — Progress Notes (Signed)
*  PRELIMINARY RESULTS* Echocardiogram Limited 2-D Echocardiogram  has been performed.  Samuel Germany 06/23/2022, 1:49 PM

## 2022-06-23 NOTE — ED Provider Notes (Signed)
[facility] Provider Note   CSN: [id number] Arrival date & time: 06/23/22  5465     History  Chief Complaint  Patient presents with   Chest Pain    Jeffrey Allen is a 66 y.o. male.  Patient has a history of for recent stents in June.  He is complaining of a pressure sensation in the epigastric area  The history is provided by the patient and medical records. No language interpreter was used.  Chest Pain Pain location:  L chest Pain quality: aching   Pain radiates to:  L arm Pain severity:  Moderate Onset quality:  Sudden Timing:  Constant Progression:  Worsening Chronicity:  New Context: not breathing   Relieved by:  Nothing Worsened by:  Nothing Ineffective treatments:  None tried Associated symptoms: no abdominal pain, no back pain, no cough, no fatigue and no headache        Home Medications Prior to Admission medications   Medication Sig Start Date End Date Taking? Authorizing Provider  allopurinol (ZYLOPRIM) 300 MG tablet Take 0.5 tablets (150 mg total) by mouth daily. 05/18/21  Yes Gosrani, Nimish C, MD  Ascorbic Acid (VITAMIN C PO) Take 1 tablet by mouth daily.   Yes [provider]  aspirin EC 81 MG EC tablet Take 1 tablet (81 mg total) by mouth daily. 01/01/20  Yes Tat, Shanon Brow, MD  Cholecalciferol (VITAMIN D3) 125 MCG (5000 UT) TABS Take 10,000 Units by mouth daily.    Yes [provider]  clopidogrel (PLAVIX) 75 MG tablet Take 4 tablets ('300mg'$  total) today followed by 1 tablet ('75mg'$ ) daily. Patient taking differently: Take 75 mg by mouth daily. 06/07/22  Yes Strader, [place] M, PA-C  ezetimibe (ZETIA) 10 MG tablet Take 1 tablet (10 mg total) by mouth daily. 05/28/22  Yes Reino Bellis B, NP  gabapentin (NEURONTIN) 100 MG capsule TAKE 1 CAPSULE BY MOUTH THREE TIMES A DAY. Patient taking differently: Take 100 mg by mouth daily. 09/22/21  Yes Lindell Spar, MD  Garlic 6812 MG CAPS Take 1,000 mg by mouth daily.   Yes  [provider]  nitroGLYCERIN (NITROSTAT) 0.4 MG SL tablet Place 1 tablet (0.4 mg total) under the tongue every 5 (five) minutes as needed for chest pain. 05/27/22  Yes Reino Bellis B, NP  pantoprazole (PROTONIX) 40 MG tablet Take 1 tablet (40 mg total) by mouth daily. 07/14/21  Yes Gosrani, Nimish C, MD  Probiotic Product (PROBIOTIC-10 PO) Take 1 tablet by mouth daily. 18 thousand   Yes [provider]  Red Yeast Rice Extract (RED YEAST RICE PO) Take 2 tablets by mouth daily.   Yes [provider]  vitamin B-12 (CYANOCOBALAMIN) 1000 MCG tablet Take 1,000 mcg by mouth daily.   Yes [provider]  acebutolol (SECTRAL) 200 MG capsule Take 1 capsule (200 mg total) by mouth daily. Patient not taking: Reported on 06/23/2022 06/20/22   [name], MD      Allergies    Prednisone and Statins    Review of Systems   Review of Systems  Constitutional:  Negative for appetite change and fatigue.  HENT:  Negative for congestion, ear discharge and sinus pressure.   Eyes:  Negative for discharge.  Respiratory:  Negative for cough.   Cardiovascular:  Positive for chest pain.  Gastrointestinal:  Negative for abdominal pain and diarrhea.  Genitourinary:  Negative for frequency and hematuria.  Musculoskeletal:  Negative for back pain.  Skin:  Negative for  rash.  Neurological:  Negative for seizures and headaches.  Psychiatric/Behavioral:  Negative for hallucinations.     Physical Exam Updated Vital Signs BP 129/84   Pulse 67   Temp 97.9 F (36.6 C) (Oral)   Resp (!) 21   Ht '5\' 7"'$  (1.702 m)   Wt 88.5 kg   SpO2 96%   BMI 30.54 kg/m  Physical Exam Vitals and nursing note reviewed.  Constitutional:      Appearance: He is well-developed.  HENT:     Head: Normocephalic.     Nose: Nose normal.  Eyes:     General: No scleral icterus.    Conjunctiva/sclera: Conjunctivae normal.  Neck:     Thyroid: No thyromegaly.  Cardiovascular:     Rate and  Rhythm: Normal rate and regular rhythm.     Heart sounds: No murmur heard.    No friction rub. No gallop.  Pulmonary:     Breath sounds: No stridor. No wheezing or rales.  Chest:     Chest wall: No tenderness.  Abdominal:     General: There is no distension.     Tenderness: There is no abdominal tenderness. There is no rebound.  Musculoskeletal:        General: Normal range of motion.     Cervical back: Neck supple.  Lymphadenopathy:     Cervical: No cervical adenopathy.  Skin:    Findings: No erythema or rash.  Neurological:     Mental Status: He is alert and oriented to person, place, and time.     Motor: No abnormal muscle tone.     Coordination: Coordination normal.  Psychiatric:        Behavior: Behavior normal.     ED Results / Procedures / Treatments   Labs (all labs ordered are listed, but only abnormal results are displayed) Labs Reviewed  COMPREHENSIVE METABOLIC PANEL - Abnormal; Notable for the following components:      Result Value   Calcium 8.8 (*)    All other components within normal limits  BRAIN NATRIURETIC PEPTIDE - Abnormal; Notable for the following components:   B Natriuretic Peptide 293.0 (*)    All other components within normal limits  TROPONIN I (HIGH SENSITIVITY) - Abnormal; Notable for the following components:   Troponin I (High Sensitivity) 89 (*)    All other components within normal limits  TROPONIN I (HIGH SENSITIVITY) - Abnormal; Notable for the following components:   Troponin I (High Sensitivity) 83 (*)    All other components within normal limits  CBC WITH DIFFERENTIAL/PLATELET  LIPASE, BLOOD    EKG EKG Interpretation  Date/Time:  Thursday June 23 2022 07:44:00 EDT Ventricular Rate:  73 PR Interval:  238 QRS Duration: 111 QT Interval:  446 QTC Calculation: 407 R Axis:   -46 Text Interpretation: Sinus rhythm Ventricular bigeminy Prolonged PR interval Left axis deviation Low voltage, extremity leads Nonspecific T  abnormalities, lateral leads Baseline wander in lead(s) II aVF V3 Confirmed by Milton Ferguson [phone]) on 06/23/2022 7:45:59 AM  Radiology DG Chest Port 1 View  Result Date: 06/23/2022 CLINICAL DATA:  Mid upper abdominal pain since yesterday shortness of breath, lightheadedness EXAM: PORTABLE CHEST 1 VIEW COMPARISON:  Chest radiograph 05/26/2019 FINDINGS: The cardiomediastinal silhouette is stable. There is no focal consolidation or pulmonary edema. There is no pleural effusion or pneumothorax There is no acute osseous abnormality. IMPRESSION: No radiographic evidence of acute cardiopulmonary process. Electronically Signed   By: Valetta Mole M.D.   On: 06/23/2022  08:28    Procedures Procedures    Medications Ordered in ED Medications  aspirin EC tablet 81 mg (81 mg Oral Given 06/23/22 1120)  clopidogrel (PLAVIX) tablet 75 mg (75 mg Oral Given 06/23/22 1120)  ezetimibe (ZETIA) tablet 10 mg (10 mg Oral Given 06/23/22 1119)  amiodarone (PACERONE) tablet 400 mg (400 mg Oral Given 06/23/22 1119)  nitroGLYCERIN (NITROSTAT) SL tablet 0.4 mg (0.4 mg Sublingual Given 06/23/22 0807)  pantoprazole (PROTONIX) injection 40 mg (40 mg Intravenous Given 06/23/22 0808)  furosemide (LASIX) injection 40 mg (40 mg Intravenous Given 06/23/22 1119)    ED Course/ Medical Decision Making/ A&P Patient with atypical chest discomfort.  He has mildly elevated troponins.  He was seen by cardiology and they recommend hospital admission by the hospitalist                         Medical Decision Making Amount and/or Complexity of Data Reviewed Labs: ordered. Radiology: ordered. ECG/medicine tests: ordered.  Risk Prescription drug management. Decision regarding hospitalization.  CRITICAL CARE Performed by: Milton Ferguson Total critical care time: 35 minutes Critical care time was exclusive of separately billable procedures and treating other patients. Critical care was necessary to treat or prevent imminent or  life-threatening deterioration. Critical care was time spent personally by me on the following activities: development of treatment plan with patient and/or surrogate as well as nursing, discussions with consultants, evaluation of patient's response to treatment, examination of patient, obtaining history from patient or surrogate, ordering and performing treatments and interventions, ordering and review of laboratory studies, ordering and review of radiographic studies, pulse oximetry and re-evaluation of patient's condition. This patient presents to the ED for concern of chest pain this involves an extensive number of treatment options, and is a complaint that carries with it a high risk of complications and morbidity.  The differential diagnosis includes PE MI   Co morbidities that complicate the patient evaluation  2 stents in heart   Additional history obtained:  Additional history obtained from patient External records from outside source obtained and reviewed including hospital record   Lab Tests:  I Ordered, and personally interpreted labs.  The pertinent results include: CBC and chemistries unremarkable, 2 troponins in the 80   Imaging Studies ordered:  I ordered imaging studies including chest x-ray I independently visualized and interpreted imaging which showed negative I agree with the radiologist interpretation   Cardiac Monitoring: / EKG:  The patient was maintained on a cardiac monitor.  I personally viewed and interpreted the cardiac monitored which showed an underlying rhythm of: Normal sinus rhythm   Consultations Obtained:  I requested consultation with the cardiology and hospital,  and discussed lab and imaging findings as well as pertinent plan - they recommend: Admit to the   Problem List / ED Course / Critical interventions / Medication management  Chest pain with stents I ordered medication including nitroglycerin Reevaluation of the patient after  these medicines showed that the patient improved I have reviewed the patients home medicines and have made adjustments as needed   Social Determinants of Health:  None   Test / Admission - Considered:  None  Patient with chest pain and coronary artery disease.  He is being admitted to [facility] by the hospitalist with cardiology consult        Final Clinical Impression(s) / ED Diagnoses Final diagnoses:  Atypical chest pain    Rx / DC Orders ED Discharge Orders  None         Milton Ferguson, MD 06/24/22 1740

## 2022-06-23 NOTE — H&P (Signed)
Patient Demographics:    Jeffrey Allen, is a 66 y.o. male  MRN: [id number]   DOB - December 22, 1956  Admit Date - 06/23/2022  Outpatient Primary MD for the patient is Har[place], MD   Assessment & Plan:   Assessment and Plan:   1)Chest Pain-  H/o CAD, had stents on 05/26/22 to Lt circumflex and mid to distal LAD, (LHC showed multivessel CAD) -EKG sinus with nonspecific T wave abnormalities in lateral leads, and lots of PVCs Troponin 89 >>.83  -Cardiology consult appreciated -Give aspirin, Plavix and Zetia apparently patient is intolerant to statins -Patient with bradycardia which makes it difficult to use beta-blockers  2)HFmrEF---Repeat limited Echo shows EF is down to 45 to 50% with global hypokinesis from 50 to 55% back on May 26, 2022 -Suspect ischemic cardiomyopathy/systolic dysfunction CHF -BNP 293, -Chest x-ray benign -IV Lasix as ordered -Daily weights and fluid input and output monitoring  3) arrhythmias--- PTA patient was on acebutolol, poorly tolerated and it was not effective for his frequent PVCs -Cardiologist recommends stopping acebutolol and starting amiodarone -EKG with frequent PVCs/bigeminy  4) GERD--- continue Protonix   Disposition/Need for in-Hospital Stay- patient unable to be discharged at this time due to -chest pains and possible CHF exacerbation with drop in EF requiring further cardiovascular evaluation--risk of decompensation  Status is: Inpatient  Remains inpatient appropriate because:   Dispo: The patient is from: Home              Anticipated d/c is to: Home              Anticipated d/c date is: 2 days              Patient currently is not medically stable to d/c. Barriers: Not Clinically Stable-    With History of - Reviewed by me  Past Medical History:   Diagnosis Date   CAD (coronary artery disease)    a. 05/2022: DES placement x1 to the mid/distal LAD, DES placement x1 to D1 and DES placement x2 to the distal LCx   GERD (gastroesophageal reflux disease)    Gout    High cholesterol    Obesity (BMI 30.0-34.9)    Peptic ulcer disease    Prediabetes    Stroke [facility]) 06/2017      Past Surgical History:  Procedure Laterality Date   BIOPSY  11/25/2020   Procedure: BIOPSY;  Surgeon: Daneil Dolin, MD;  [facility];  Service: Endoscopy;;  gastric   COLONOSCOPY     COLONOSCOPY N/A 12/07/2016   Scattered small and large-mouthed diverticula in sigmoid and descending colon. Six semi-pedunculated polyps in sigmoid and ascending colon, 4-6 mm in size. Distal TI normal appearing. Hyperplastic and tubular adenomas. 3 year surveillance recommended.    COLONOSCOPY N/A 11/25/2020   diverticulosis in sigmoid and descending colon, three 3-4 mm polyps at rectosigmoid colon and hepatic flexure. Hyperplastic polyp. 5 year colonoscopy.    CORONARY  STENT INTERVENTION N/A 05/26/2022   Procedure: CORONARY STENT INTERVENTION;  Surgeon: Burnell Blanks, MD;  Location: [place] [facility];  Service: Cardiovascular;  Laterality: N/A;   ESOPHAGOGASTRODUODENOSCOPY N/A 11/25/2020     normal esophagus, portal gastropathy, normal duodenum. Mild chronic gastritis, negative H.pylori.    LEFT HEART CATH AND CORS/GRAFTS ANGIOGRAPHY N/A 05/26/2022   Procedure: LEFT HEART CATH AND CORS/GRAFTS ANGIOGRAPHY;  Surgeon: Burnell Blanks, MD;  Location: [place] [facility];  Service: Cardiovascular;  Laterality: N/A;   POLYPECTOMY  11/25/2020   Procedure: POLYPECTOMY;  Surgeon: Daneil Dolin, MD;  [facility];  Service: Endoscopy;;   TOOTH EXTRACTION     Chief Complaint  Patient presents with   Chest Pain      HPI:    Jeffrey Allen  is a 66 y.o. male with past medical history relevant for H/o CAD, had stents on 05/26/22 to Lt circumflex  and mid to distal LAD, (LHC showed multivessel CAD), gout, GERD, dyslipidemia arrhythmias who presented to the ED on 06/23/2022 with concerns about shortness of breath and chest pains- -Additional history obtained from patient's wife at bedside -No leg pains ,  no pleuritic type symptoms -EKG sinus with nonspecific T wave abnormalities in lateral leads, and lots of PVCs Troponin 89 >>.83  -Chest x-ray without acute findings BNP 293, creatinine 0.91 -Hgb 15.4    Review of systems:    In addition to the HPI above,   A full Review of  Systems was done, all other systems reviewed are negative except as noted above in HPI , .    Social History:  Reviewed by me    Social History   Tobacco Use   Smoking status: Former    Packs/day: 0.02    Years: 25.00    Total pack years: 0.50    Types: Cigarettes    Quit date: 12/26/2006    Years since quitting: 15.5   Smokeless tobacco: Never  Substance Use Topics   Alcohol use: Not Currently       Family History :  Reviewed by me    Family History  Problem Relation Age of Onset   Dementia Mother    Hypertension Father    Diabetes Father    COPD Father    Hyperlipidemia Father    Heart attack Father    Colon cancer Neg Hx      Home Medications:   Prior to Admission medications   Medication Sig Start Date End Date Taking? Authorizing Provider  allopurinol (ZYLOPRIM) 300 MG tablet Take 0.5 tablets (150 mg total) by mouth daily. 05/18/21  Yes Gosrani, Nimish C, MD  Ascorbic Acid (VITAMIN C PO) Take 1 tablet by mouth daily.   Yes [provider]  aspirin EC 81 MG EC tablet Take 1 tablet (81 mg total) by mouth daily. 01/01/20  Yes Tat, Shanon Brow, MD  Cholecalciferol (VITAMIN D3) 125 MCG (5000 UT) TABS Take 10,000 Units by mouth daily.    Yes [provider]  clopidogrel (PLAVIX) 75 MG tablet Take 4 tablets ('300mg'$  total) today followed by 1 tablet ('75mg'$ ) daily. Patient taking differently: Take 75 mg by mouth daily. 06/07/22   Yes Strader, [place] M, PA-C  ezetimibe (ZETIA) 10 MG tablet Take 1 tablet (10 mg total) by mouth daily. 05/28/22  Yes Reino Bellis B, NP  gabapentin (NEURONTIN) 100 MG capsule TAKE 1 CAPSULE BY MOUTH THREE TIMES A DAY. Patient taking differently: Take 100 mg by mouth daily. 09/22/21  Yes  Lindell Spar, MD  Garlic 8413 MG CAPS Take 1,000 mg by mouth daily.   Yes [provider]  nitroGLYCERIN (NITROSTAT) 0.4 MG SL tablet Place 1 tablet (0.4 mg total) under the tongue every 5 (five) minutes as needed for chest pain. 05/27/22  Yes Reino Bellis B, NP  pantoprazole (PROTONIX) 40 MG tablet Take 1 tablet (40 mg total) by mouth daily. 07/14/21  Yes Gosrani, Nimish C, MD  Probiotic Product (PROBIOTIC-10 PO) Take 1 tablet by mouth daily. 18 thousand   Yes [provider]  Red Yeast Rice Extract (RED YEAST RICE PO) Take 2 tablets by mouth daily.   Yes [provider]  vitamin B-12 (CYANOCOBALAMIN) 1000 MCG tablet Take 1,000 mcg by mouth daily.   Yes [provider]  acebutolol (SECTRAL) 200 MG capsule Take 1 capsule (200 mg total) by mouth daily. Patient not taking: Reported on 06/23/2022 06/20/22   [name], MD     Allergies:     Allergies  Allergen Reactions   Prednisone Other (See Comments)    Peptic ulcers     Statins      Physical Exam:   Vitals  Blood pressure 123/76, pulse (!) 57, temperature 98.3 F (36.8 C), temperature source Oral, resp. rate 20, height '5\' 7"'$  (1.702 m), weight 86.8 kg, SpO2 96 %.  Physical Examination: General appearance - alert,  in no distress Mental status - alert, oriented to person, place, and time,  Eyes - sclera anicteric Neck - supple, no JVD elevation , Chest - clear  to auscultation bilaterally, symmetrical air movement,  Heart - S1 and S2 normal, regular  Abdomen - soft, nontender, nondistended, +BS Neurological - screening mental status exam normal, neck supple without rigidity, cranial nerves II  through XII intact, DTR's normal and symmetric Extremities - +ve pedal edema noted, intact peripheral pulses  Skin - warm, dry     Data Review:    CBC Recent Labs  Lab 06/23/22 0742  WBC 7.0  HGB 15.4  HCT 45.3  PLT 157  MCV 92.6  MCH 31.5  MCHC 34.0  RDW 12.5  LYMPHSABS 1.4  MONOABS 0.6  EOSABS 0.2  BASOSABS 0.1   ------------------------------------------------------------------------------------------------------------------  Chemistries  Recent Labs  Lab 06/23/22 0742  NA 140  K 3.7  CL 105  CO2 26  GLUCOSE 92  BUN 23  CREATININE 0.91  CALCIUM 8.8*  AST 20  ALT 25  ALKPHOS 48  BILITOT 0.9   ------------------------------------------------------------------------------------------------------------------ estimated creatinine clearance is 86.3 mL/min (by C-G formula based on SCr of 0.91 mg/dL). ------------------------------------------------------------------------------------------------------------------ No results for input(s): "TSH", "T4TOTAL", "T3FREE", "THYROIDAB" in the last 72 hours.  Invalid input(s): "FREET3"   Coagulation profile No results for input(s): "INR", "PROTIME" in the last 168 hours. ------------------------------------------------------------------------------------------------------------------- No results for input(s): "DDIMER" in the last 72 hours. -------------------------------------------------------------------------------------------------------------------  Cardiac Enzymes No results for input(s): "CKMB", "TROPONINI", "MYOGLOBIN" in the last 168 hours.  Invalid input(s): "CK" ------------------------------------------------------------------------------------------------------------------    Component Value Date/Time   BNP 293.0 (H) 06/23/2022 0742     ---------------------------------------------------------------------------------------------------------------  Urinalysis    Component Value Date/Time    COLORURINE YELLOW 08/27/2020 1631   APPEARANCEUR CLEAR 08/27/2020 1631   LABSPEC 1.021 08/27/2020 1631   PHURINE 5.5 08/27/2020 1631   GLUCOSEU NEGATIVE 08/27/2020 1631   HGBUR TRACE (A) 08/27/2020 1631   BILIRUBINUR negative 08/25/2020 1041   KETONESUR NEGATIVE 08/27/2020 1631   PROTEINUR NEGATIVE 08/27/2020 1631   UROBILINOGEN 1.0 08/25/2020 1041   UROBILINOGEN 0.2 09/04/2015  2156   NITRITE Negative 08/25/2020 1041   NITRITE NEGATIVE 06/18/2018 1822   LEUKOCYTESUR Negative 08/25/2020 1041    ----------------------------------------------------------------------------------------------------------------   Imaging Results:    ECHOCARDIOGRAM LIMITED  Result Date: 06/23/2022    ECHOCARDIOGRAM LIMITED REPORT   Patient Name:   Jeffrey Allen Date of Exam: 06/23/2022 Medical Rec #:  [id number]       Height:       67.0 in Accession #:    [id number]      Weight:       191.4 lb Date of Birth:  [birth year]       BSA:          1.985 m Patient Age:    55 years        BP:           162/92 mmHg Patient Gender: M               HR:           67 bpm. Exam Location:  [facility] Procedure: Limited Echo Indications:    R07.9 Chest Pain, Eval LVEF and Wall Motion  History:        Patient has prior history of Echocardiogram examinations, most                 recent 05/26/2022. Previous Myocardial Infarction, Stroke; Risk                 Factors:Hypertension, Diabetes and Dyslipidemia. COVID-19 virus                 infection.  Sonographer:    Alvino Chapel RCS Referring Phys: [id number] [place]  1. Limited echo for LVEF  2. Left ventricular ejection fraction, by estimation, is 45 to 50%. Left ventricular ejection fraction by 2D MOD biplane is 47.4 %. The left ventricle has mildly decreased function. The left ventricle demonstrates global hypokinesis.  3. The inferior vena cava is normal in size with greater than 50% respiratory variability, suggesting right atrial pressure of 3 mmHg. Comparison(s):  Changes from prior study are noted. 05/26/2022: LVEF 50-55%. FINDINGS  Left Ventricle: Left ventricular ejection fraction, by estimation, is 45 to 50%. Left ventricular ejection fraction by 2D MOD biplane is 47.4 %. The left ventricle has mildly decreased function. The left ventricle demonstrates global hypokinesis. The left ventricular internal cavity size was normal in size. There is no left ventricular hypertrophy. Venous: The inferior vena cava is normal in size with greater than 50% respiratory variability, suggesting right atrial pressure of 3 mmHg. LEFT VENTRICLE PLAX 2D                        Biplane EF (MOD) LVIDd:         5.70 cm         LV Biplane EF:   Left LVIDs:         2.90 cm                          ventricular LV PW:         1.10 cm                          ejection LV IVS:        1.00 cm  fraction by                                                 2D MOD                                                 biplane is LV Volumes (MOD)                                47.4 %. LV vol d, MOD    139.0 ml A2C: LV vol d, MOD    153.9 ml A4C: LV vol s, MOD    76.6 ml A2C: LV vol s, MOD    77.0 ml A4C: LV SV MOD A2C:   62.5 ml LV SV MOD A4C:   153.9 ml LV SV MOD BP:    69.7 ml LEFT ATRIUM         Index LA diam:    4.80 cm 2.42 cm/m   AORTA Ao Root diam: 3.20 cm Lyman Bishop MD Electronically signed by Lyman Bishop MD Signature Date/Time: 06/23/2022/5:00:39 PM    Final    DG Chest Port 1 View  Result Date: 06/23/2022 CLINICAL DATA:  Mid upper abdominal pain since yesterday shortness of breath, lightheadedness EXAM: PORTABLE CHEST 1 VIEW COMPARISON:  Chest radiograph 05/26/2019 FINDINGS: The cardiomediastinal silhouette is stable. There is no focal consolidation or pulmonary edema. There is no pleural effusion or pneumothorax There is no acute osseous abnormality. IMPRESSION: No radiographic evidence of acute cardiopulmonary process. Electronically Signed   By: Valetta Mole M.D.   On:  06/23/2022 08:28    Radiological Exams on Admission: ECHOCARDIOGRAM LIMITED  Result Date: 06/23/2022    ECHOCARDIOGRAM LIMITED REPORT   Patient Name:   Jeffrey Allen Date of Exam: 06/23/2022 Medical Rec #:  [id number]       Height:       67.0 in Accession #:    [id number]      Weight:       191.4 lb Date of Birth:  1956-11-03       BSA:          1.985 m Patient Age:    47 years        BP:           162/92 mmHg Patient Gender: M               HR:           67 bpm. Exam Location:  [facility] Procedure: Limited Echo Indications:    R07.9 Chest Pain, Eval LVEF and Wall Motion  History:        Patient has prior history of Echocardiogram examinations, most                 recent 05/26/2022. Previous Myocardial Infarction, Stroke; Risk                 Factors:Hypertension, Diabetes and Dyslipidemia. COVID-19 virus                 infection.  Sonographer:    Alvino Chapel RCS Referring Phys: [id number] [place]  1. Limited echo for LVEF  2. Left ventricular ejection fraction, by estimation, is 45 to 50%. Left ventricular ejection fraction by 2D MOD biplane is 47.4 %. The left ventricle has mildly decreased function. The left ventricle demonstrates global hypokinesis.  3. The inferior vena cava is normal in size with greater than 50% respiratory variability, suggesting right atrial pressure of 3 mmHg. Comparison(s): Changes from prior study are noted. 05/26/2022: LVEF 50-55%. FINDINGS  Left Ventricle: Left ventricular ejection fraction, by estimation, is 45 to 50%. Left ventricular ejection fraction by 2D MOD biplane is 47.4 %. The left ventricle has mildly decreased function. The left ventricle demonstrates global hypokinesis. The left ventricular internal cavity size was normal in size. There is no left ventricular hypertrophy. Venous: The inferior vena cava is normal in size with greater than 50% respiratory variability, suggesting right atrial pressure of 3 mmHg. LEFT VENTRICLE PLAX 2D                         Biplane EF (MOD) LVIDd:         5.70 cm         LV Biplane EF:   Left LVIDs:         2.90 cm                          ventricular LV PW:         1.10 cm                          ejection LV IVS:        1.00 cm                          fraction by                                                 2D MOD                                                 biplane is LV Volumes (MOD)                                47.4 %. LV vol d, MOD    139.0 ml A2C: LV vol d, MOD    153.9 ml A4C: LV vol s, MOD    76.6 ml A2C: LV vol s, MOD    77.0 ml A4C: LV SV MOD A2C:   62.5 ml LV SV MOD A4C:   153.9 ml LV SV MOD BP:    69.7 ml LEFT ATRIUM         Index LA diam:    4.80 cm 2.42 cm/m   AORTA Ao Root diam: 3.20 cm Lyman Bishop MD Electronically signed by Lyman Bishop MD Signature Date/Time: 06/23/2022/5:00:39 PM    Final    DG Chest Port 1 View  Result Date: 06/23/2022 CLINICAL DATA:  Mid upper abdominal pain since yesterday shortness of breath, lightheadedness EXAM: PORTABLE CHEST 1 VIEW COMPARISON:  Chest radiograph 05/26/2019  FINDINGS: The cardiomediastinal silhouette is stable. There is no focal consolidation or pulmonary edema. There is no pleural effusion or pneumothorax There is no acute osseous abnormality. IMPRESSION: No radiographic evidence of acute cardiopulmonary process. Electronically Signed   By: Valetta Mole M.D.   On: 06/23/2022 08:28    DVT Prophylaxis -SCD /heparin  AM Labs Ordered, also please review Full Orders  Family Communication: Admission, patients condition and plan of care including tests being ordered have been discussed with the patient and wife at bedside  who indicate understanding and agree with the plan   Condition   stable  Roxan Hockey M.D on 06/23/2022 at 7:30 PM Go to [email or web address] -  for contact info  [facility] - Office  [phone]

## 2022-06-23 NOTE — ED Triage Notes (Signed)
Pt c/o mid upper abdominal pain since yesterday while riding his tractor. Pt also c/o SOB and lightheadedness. Denies n/v. Pt had 4 cardiac stents placed 3 weeks ago at [facility].

## 2022-06-24 ENCOUNTER — Inpatient Hospital Stay (HOSPITAL_COMMUNITY): Payer: 59

## 2022-06-24 DIAGNOSIS — I493 Ventricular premature depolarization: Secondary | ICD-10-CM

## 2022-06-24 DIAGNOSIS — R079 Chest pain, unspecified: Secondary | ICD-10-CM | POA: Diagnosis not present

## 2022-06-24 DIAGNOSIS — M791 Myalgia, unspecified site: Secondary | ICD-10-CM

## 2022-06-24 DIAGNOSIS — I5023 Acute on chronic systolic (congestive) heart failure: Secondary | ICD-10-CM | POA: Diagnosis not present

## 2022-06-24 DIAGNOSIS — T466X5A Adverse effect of antihyperlipidemic and antiarteriosclerotic drugs, initial encounter: Secondary | ICD-10-CM

## 2022-06-24 DIAGNOSIS — Z8673 Personal history of transient ischemic attack (TIA), and cerebral infarction without residual deficits: Secondary | ICD-10-CM | POA: Diagnosis not present

## 2022-06-24 DIAGNOSIS — I25118 Atherosclerotic heart disease of native coronary artery with other forms of angina pectoris: Secondary | ICD-10-CM

## 2022-06-24 LAB — CBC
HCT: 49.8 % (ref 39.0–52.0)
Hemoglobin: 17.3 g/dL — ABNORMAL HIGH (ref 13.0–17.0)
MCH: 31.5 pg (ref 26.0–34.0)
MCHC: 34.7 g/dL (ref 30.0–36.0)
MCV: 90.7 fL (ref 80.0–100.0)
Platelets: 170 10*3/uL (ref 150–400)
RBC: 5.49 MIL/uL (ref 4.22–5.81)
RDW: 12.5 % (ref 11.5–15.5)
WBC: 8.5 10*3/uL (ref 4.0–10.5)
nRBC: 0 % (ref 0.0–0.2)

## 2022-06-24 LAB — BASIC METABOLIC PANEL
Anion gap: 10 (ref 5–15)
BUN: 32 mg/dL — ABNORMAL HIGH (ref 8–23)
CO2: 27 mmol/L (ref 22–32)
Calcium: 9.7 mg/dL (ref 8.9–10.3)
Chloride: 102 mmol/L (ref 98–111)
Creatinine, Ser: 1.18 mg/dL (ref 0.61–1.24)
GFR, Estimated: >60 mL/min (ref >60)
Glucose, Bld: 132 mg/dL — ABNORMAL HIGH (ref 70–99)
Potassium: 3.4 mmol/L — ABNORMAL LOW (ref 3.5–5.1)
Sodium: 139 mmol/L (ref 135–145)

## 2022-06-24 LAB — TROPONIN I (HIGH SENSITIVITY): Troponin I (High Sensitivity): 82 ng/L — ABNORMAL HIGH (ref <18)

## 2022-06-24 LAB — MAGNESIUM: Magnesium: 2.1 mg/dL (ref 1.7–2.4)

## 2022-06-24 MED ORDER — POTASSIUM CHLORIDE CRYS ER 20 MEQ PO TBCR
40.0000 meq | EXTENDED_RELEASE_TABLET | Freq: Once | ORAL | Status: AC
  Administered 2022-06-24: 40 meq via ORAL
  Filled 2022-06-24: qty 2

## 2022-06-24 MED ORDER — LOSARTAN POTASSIUM 50 MG PO TABS
25.0000 mg | ORAL_TABLET | Freq: Every day | ORAL | Status: DC
  Administered 2022-06-24 – 2022-06-25 (×2): 25 mg via ORAL
  Filled 2022-06-24 (×3): qty 1

## 2022-06-24 MED ORDER — FUROSEMIDE 10 MG/ML IJ SOLN
40.0000 mg | Freq: Two times a day (BID) | INTRAMUSCULAR | Status: DC
  Administered 2022-06-25: 40 mg via INTRAVENOUS
  Filled 2022-06-24: qty 4

## 2022-06-24 MED ORDER — IOHEXOL 350 MG/ML SOLN
75.0000 mL | Freq: Once | INTRAVENOUS | Status: AC | PRN
  Administered 2022-06-24: 75 mL via INTRAVENOUS

## 2022-06-24 NOTE — Progress Notes (Signed)
Cardiologist at bedside and has reviewed EKG. Jugular vein distention noted upon exam. Since patient does not appear to be symptomatic other than chest  "feeling weird" cardiologist believes that patient is appropriately placed on this medical surgical unit. Bryson Corona Edd Fabian

## 2022-06-24 NOTE — Progress Notes (Signed)
  Transition of Care [facility]) Screening Note   Patient Details  Name: Jeffrey Allen Date of Birth: 1956-02-19   Transition of Care [facility]) CM/SW Contact:    Ihor Gully, LCSW Phone Number: 06/24/2022, 3:35 PM    Transition of Care Department [facility]) has reviewed patient and no TOC needs have been identified at this time. We will continue to monitor patient advancement through interdisciplinary progression rounds. If new patient transition needs arise, please place a TOC consult.

## 2022-06-24 NOTE — Plan of Care (Signed)
  Problem: Education: Goal: Understanding of CV disease, CV risk reduction, and recovery process will improve Outcome: Progressing   Problem: Activity: Goal: Ability to return to baseline activity level will improve Outcome: Progressing   Problem: Cardiovascular: Goal: Ability to achieve and maintain adequate cardiovascular perfusion will improve Outcome: Progressing

## 2022-06-24 NOTE — Progress Notes (Signed)
Patient having increased chest discomfort rated 3/10 compared to 1/10 earlier. He is now able to described pain as burning and worse with inspiration. Vital signs obtained and appear stable; continued bradycardia present. This RN reached out to Dr Joesph Fillers who orders CTA Chest. Pt informed. Bryson Corona Edd Fabian

## 2022-06-24 NOTE — Progress Notes (Signed)
PROGRESS NOTE     Jeffrey Allen, is a 66 y.o. male, DOB - Mar 15, 1956, SUP:[id number]  Admit date - 06/23/2022   Admitting Physician Alylah Blakney Denton Brick, MD  Outpatient Primary MD for the patient is Jeffrey Nation, MD  LOS - 1  Chief Complaint  Patient presents with   Chest Pain        Brief Narrative:  66 y.o. male with past medical history relevant for H/o CAD, had stents on 05/26/22 to Lt circumflex and mid to distal LAD, (LHC showed multivessel CAD), gout, GERD, dyslipidemia arrhythmias who presented to the ED on 06/23/2022 with chest pains and CHF exacerbation    -Assessment and Plan: 1)Chest Pain-  H/o CAD, had stents on 05/26/22 to Lt circumflex and mid to distal LAD, (LHC showed multivessel CAD) -EKG sinus with nonspecific T wave abnormalities in lateral leads, and lots of PVCs Troponin 89 >>.83>>82 -Cardiology consult appreciated -c/n  aspirin, Plavix and Zetia apparently patient is intolerant to statins -Repeat EKG on 06/24/2022 with sinus bradycardia, PACs and first-degree AV block -Patient with bradycardia which makes it difficult to use beta-blockers -Patient already ruled out for ACS by cardiac enzymes and EKG -Patient is on Repatha as outpatient -CTA chest without PE or other acute findings   2)HFmrEF---Repeat limited Echo shows EF is down to 45 to 50% with global hypokinesis from 50 to 55% back on May 26, 2022 -Suspect ischemic cardiomyopathy/systolic dysfunction CHF -BNP 293, -Recurrent chest discomfort/dyspnea persist cardiologist recommends additional IV Lasix as ordered -Daily weights and fluid input and output monitoring   3) arrhythmias--- PTA patient was on acebutolol, poorly tolerated and it was not effective for his frequent PVCs -Cardiologist recommends stopping acebutolol  -Continue amiodarone on monitor patient has PACs and frequent PVCs/bigeminy -May need PVC ablation down the road   4) GERD--- continue Protonix     Disposition/Need for  in-Hospital Stay- patient unable to be discharged at this time due to -chest pains and possible CHF exacerbation with drop in EF requiring further cardiovascular evaluation--risk of decompensation   Status is: Inpatient   Remains inpatient appropriate because:    Dispo: The patient is from: Home              Anticipated d/c is to: Home              Anticipated d/c date is: 1 days              Patient currently is not medically stable to d/c. Barriers: Not Clinically Stable-   Code Status :  -  Code Status: Full Code   Family Communication:    Discussed with patient's wife at bedside  DVT Prophylaxis  :   - SCDs  heparin injection 5,000 Units Start: 06/23/22 1400 SCDs Start: 06/23/22 1220 Place TED hose Start: 06/23/22 1220   Lab Results  Component Value Date   PLT 170 06/24/2022    Inpatient Medications  Scheduled Meds:  amiodarone  400 mg Oral BID   aspirin EC  81 mg Oral Daily   clopidogrel  75 mg Oral Daily   ezetimibe  10 mg Oral Daily   [START ON 06/25/2022] furosemide  40 mg Intravenous Q12H   gabapentin  100 mg Oral Daily   heparin  5,000 Units Subcutaneous Q8H   losartan  25 mg Oral Daily   pantoprazole  40 mg Oral Daily   sodium chloride flush  3 mL Intravenous Q12H   Continuous Infusions: PRN Meds:.acetaminophen **OR** acetaminophen, bisacodyl, ondansetron **  OR** ondansetron (ZOFRAN) IV, mouth rinse, polyethylene glycol   Anti-infectives (From admission, onward)    None         Subjective: [name] today has no fevers, no emesis,   --Recurrent chest discomfort/dyspnea persist cardiologist recommends additional IV Lasix as ordered    Objective: Vitals:   06/24/22 1031 06/24/22 1152 06/24/22 1329 06/24/22 1349  BP: 135/88 132/79 119/64 124/88  Pulse:  (!) 53 (!) 55 (!) 55  Resp:  '18 20 18  '$ Temp:  97.8 F (36.6 C) 98.4 F (36.9 C) 98.1 F (36.7 C)  TempSrc:  Oral Oral Oral  SpO2:   96% 95%  Weight:      Height:       No intake or  output data in the 24 hours ending 06/24/22 1812 Filed Weights   06/23/22 0742 06/23/22 1130 06/24/22 0500  Weight: 88.5 kg 86.8 kg 84.7 kg    Physical Exam  Gen:- Awake Alert, no conversational dyspnea HEENT:- [place].AT, No sclera icterus Neck-Supple Neck,No JVD,.  Lungs-diminished, no wheezing  CV- S1, S2 normal, irregular but not irregularly irregular Abd-  +ve B.Sounds, Abd Soft, No tenderness,    Extremity/Skin:- No  edema, pedal pulses present  Psych-affect is appropriate, oriented x3 Neuro-no new focal deficits, no tremors  Data Reviewed: I have personally reviewed following labs and imaging studies  CBC: Recent Labs  Lab 06/23/22 0742 06/24/22 0555  WBC 7.0 8.5  NEUTROABS 4.7  --   HGB 15.4 17.3*  HCT 45.3 49.8  MCV 92.6 90.7  PLT 157 253   Basic Metabolic Panel: Recent Labs  Lab 06/23/22 0742 06/24/22 0555  NA 140 139  K 3.7 3.4*  CL 105 102  CO2 26 27  GLUCOSE 92 132*  BUN 23 32*  CREATININE 0.91 1.18  CALCIUM 8.8* 9.7  MG  --  2.1   GFR: Estimated Creatinine Clearance: 65.7 mL/min (by C-G formula based on SCr of 1.18 mg/dL). Liver Function Tests: Recent Labs  Lab 06/23/22 0742  AST 20  ALT 25  ALKPHOS 48  BILITOT 0.9  PROT 6.7  ALBUMIN 3.9   Cardiac Enzymes: No results for input(s): "CKTOTAL", "CKMB", "CKMBINDEX", "TROPONINI" in the last 168 hours. BNP (last 3 results) No results for input(s): "PROBNP" in the last 8760 hours. HbA1C: No results for input(s): "HGBA1C" in the last 72 hours. Sepsis Labs: '@LABRCNTIP'$ (procalcitonin:4,lacticidven:4) )No results found for this or any previous visit (from the past 240 hour(s)).    Radiology Studies: CT Angio Chest Pulmonary Embolism (PE) W or WO Contrast  Result Date: 06/24/2022 CLINICAL DATA:  Chest pain, shortness of breath EXAM: CT ANGIOGRAPHY CHEST WITH CONTRAST TECHNIQUE: Multidetector CT imaging of the chest was performed using the standard protocol during bolus administration of  intravenous contrast. Multiplanar CT image reconstructions and MIPs were obtained to evaluate the vascular anatomy. RADIATION DOSE REDUCTION: This exam was performed according to the departmental dose-optimization program which includes automated exposure control, adjustment of the mA and/or kV according to patient size and/or use of iterative reconstruction technique. CONTRAST:  81m OMNIPAQUE IOHEXOL 350 MG/ML SOLN COMPARISON:  Chest radiographs done on 06/23/2022 FINDINGS: Cardiovascular: There is homogeneous enhancement in thoracic aorta. Ascending thoracic aorta measures 3.7 cm. There are scattered coronary artery calcifications. There are no intraluminal filling defects in the pulmonary artery branches. Mediastinum/Nodes: Unremarkable. Lungs/Pleura: There is no focal pulmonary consolidation. There is no pleural effusion or pneumothorax. Upper Abdomen: Small low-density lesions are seen in the upper aspect of liver possibly cysts  or hemangiomas. There is no dilation of bile ducts. Gallbladder is unremarkable. There is 2 mm calculus in the upper pole of left kidney. Diverticula are seen in the colon. Musculoskeletal: No acute findings are seen. Review of the MIP images confirms the above findings. IMPRESSION: There is no evidence of pulmonary artery embolism. There is no evidence of thoracic aortic dissection. There is no focal pulmonary consolidation. Coronary artery calcifications are seen. There is ectasia of ascending thoracic aorta measuring 3.7 cm. Possible cyst or hemangioma in the liver. Small left renal stone. Diverticulosis of colon. Other findings as described in the body of the report. Electronically Signed   By: Elmer Picker M.D.   On: 06/24/2022 14:44   ECHOCARDIOGRAM LIMITED  Result Date: 06/23/2022    ECHOCARDIOGRAM LIMITED REPORT   Patient Name:   KYLLIAN CLINGERMAN Date of Exam: 06/23/2022 Medical Rec #:  [id number]       Height:       67.0 in Accession #:    [id number]      Weight:        191.4 lb Date of Birth:  [birth year]       BSA:          1.985 m Patient Age:    73 years        BP:           162/92 mmHg Patient Gender: M               HR:           67 bpm. Exam Location:  [facility] Procedure: Limited Echo Indications:    R07.9 Chest Pain, Eval LVEF and Wall Motion  History:        Patient has prior history of Echocardiogram examinations, most                 recent 05/26/2022. Previous Myocardial Infarction, Stroke; Risk                 Factors:Hypertension, Diabetes and Dyslipidemia. COVID-19 virus                 infection.  Sonographer:    Alvino Chapel RCS Referring Phys: [id number] [place]  1. Limited echo for LVEF  2. Left ventricular ejection fraction, by estimation, is 45 to 50%. Left ventricular ejection fraction by 2D MOD biplane is 47.4 %. The left ventricle has mildly decreased function. The left ventricle demonstrates global hypokinesis.  3. The inferior vena cava is normal in size with greater than 50% respiratory variability, suggesting right atrial pressure of 3 mmHg. Comparison(s): Changes from prior study are noted. 05/26/2022: LVEF 50-55%. FINDINGS  Left Ventricle: Left ventricular ejection fraction, by estimation, is 45 to 50%. Left ventricular ejection fraction by 2D MOD biplane is 47.4 %. The left ventricle has mildly decreased function. The left ventricle demonstrates global hypokinesis. The left ventricular internal cavity size was normal in size. There is no left ventricular hypertrophy. Venous: The inferior vena cava is normal in size with greater than 50% respiratory variability, suggesting right atrial pressure of 3 mmHg. LEFT VENTRICLE PLAX 2D                        Biplane EF (MOD) LVIDd:         5.70 cm         LV Biplane EF:   Left LVIDs:         2.90 cm  ventricular LV PW:         1.10 cm                          ejection LV IVS:        1.00 cm                          fraction by                                                  2D MOD                                                 biplane is LV Volumes (MOD)                                47.4 %. LV vol d, MOD    139.0 ml A2C: LV vol d, MOD    153.9 ml A4C: LV vol s, MOD    76.6 ml A2C: LV vol s, MOD    77.0 ml A4C: LV SV MOD A2C:   62.5 ml LV SV MOD A4C:   153.9 ml LV SV MOD BP:    69.7 ml LEFT ATRIUM         Index LA diam:    4.80 cm 2.42 cm/m   AORTA Ao Root diam: 3.20 cm Lyman Bishop MD Electronically signed by Lyman Bishop MD Signature Date/Time: 06/23/2022/5:00:39 PM    Final    DG Chest Port 1 View  Result Date: 06/23/2022 CLINICAL DATA:  Mid upper abdominal pain since yesterday shortness of breath, lightheadedness EXAM: PORTABLE CHEST 1 VIEW COMPARISON:  Chest radiograph 05/26/2019 FINDINGS: The cardiomediastinal silhouette is stable. There is no focal consolidation or pulmonary edema. There is no pleural effusion or pneumothorax There is no acute osseous abnormality. IMPRESSION: No radiographic evidence of acute cardiopulmonary process. Electronically Signed   By: Valetta Mole M.D.   On: 06/23/2022 08:28    Scheduled Meds:  amiodarone  400 mg Oral BID   aspirin EC  81 mg Oral Daily   clopidogrel  75 mg Oral Daily   ezetimibe  10 mg Oral Daily   [START ON 06/25/2022] furosemide  40 mg Intravenous Q12H   gabapentin  100 mg Oral Daily   heparin  5,000 Units Subcutaneous Q8H   losartan  25 mg Oral Daily   pantoprazole  40 mg Oral Daily   sodium chloride flush  3 mL Intravenous Q12H   Continuous Infusions:   LOS: 1 day   Roxan Hockey M.D on 06/24/2022 at 6:12 PM  Go to [email or web address] - for contact info  [facility] - Office  [phone]  If 7PM-7AM, please contact night-coverage [email or web address] 06/24/2022, 6:12 PM

## 2022-06-24 NOTE — Progress Notes (Signed)
Progress Note  Patient Name: Jeffrey Allen Date of Encounter: 06/24/2022  Primary Cardiologist: Rozann Lesches, MD   Subjective   Overnight limited echo with LVEF 45%. Called to be bedside for funny heeling and pulse O2 heart rate of 25 His index angina was jaw claudication. He has a funny feeling in his heart. He has improved breathing.  Inpatient Medications    Scheduled Meds:  amiodarone  400 mg Oral BID   aspirin EC  81 mg Oral Daily   clopidogrel  75 mg Oral Daily   ezetimibe  10 mg Oral Daily   furosemide  40 mg Intravenous Q12H   gabapentin  100 mg Oral Daily   heparin  5,000 Units Subcutaneous Q8H   pantoprazole  40 mg Oral Daily   potassium chloride  40 mEq Oral Once   sodium chloride flush  3 mL Intravenous Q12H   Continuous Infusions:  PRN Meds: acetaminophen **OR** acetaminophen, bisacodyl, ondansetron **OR** ondansetron (ZOFRAN) IV, mouth rinse, polyethylene glycol   Vital Signs    Vitals:   06/23/22 1930 06/23/22 2341 06/24/22 0451 06/24/22 0500  BP: (!) 140/97 126/76 137/71   Pulse: (!) 54 92 65   Resp: '14 18 18   '$ Temp: 97.8 F (36.6 C) 98.1 F (36.7 C) 97.9 F (36.6 C)   TempSrc: Oral Oral Oral   SpO2: 97% (!) 83% 99%   Weight:    84.7 kg  Height:       No intake or output data in the 24 hours ending 06/24/22 0829 Filed Weights   06/23/22 0742 06/23/22 1130 06/24/22 0500  Weight: 88.5 kg 86.8 kg 84.7 kg    Telemetry    Sinus bradycardia with couplets and triplets and frequent PVCs, NSVT is a triplet - Personally Reviewed  ECG    Marked sinus bradycardia with PVCs - Personally Reviewed  Physical Exam   Gen: no distress   Neck: Elevated JVD to mid neck at 45 degrees Cardiac: No Rubs or Gallops, no Murmur, IRIR bradycardia, +2 radial pulses Respiratory: Decreased breath sounds with basilar crackles GI: Soft, nontender, non-distended  MS: No  edema;  moves all extremities Integument: Skin feels warm Neuro:  At time of  evaluation, alert and oriented to person/place/time/situation  Psych: Normal affect, patient feels ok   Labs    Chemistry Recent Labs  Lab 06/23/22 0742 06/24/22 0555  NA 140 139  K 3.7 3.4*  CL 105 102  CO2 26 27  GLUCOSE 92 132*  BUN 23 32*  CREATININE 0.91 1.18  CALCIUM 8.8* 9.7  PROT 6.7  --   ALBUMIN 3.9  --   AST 20  --   ALT 25  --   ALKPHOS 48  --   BILITOT 0.9  --   GFRNONAA >60 >60  ANIONGAP 9 10     Hematology Recent Labs  Lab 06/23/22 0742 06/24/22 0555  WBC 7.0 8.5  RBC 4.89 5.49  HGB 15.4 17.3*  HCT 45.3 49.8  MCV 92.6 90.7  MCH 31.5 31.5  MCHC 34.0 34.7  RDW 12.5 12.5  PLT 157 170    Cardiac EnzymesNo results for input(s): "TROPONINI" in the last 168 hours. No results for input(s): "TROPIPOC" in the last 168 hours.   BNP Recent Labs  Lab 06/23/22 0742  BNP 293.0*     DDimer No results for input(s): "DDIMER" in the last 168 hours.   Radiology    ECHOCARDIOGRAM LIMITED  Result Date: 06/23/2022  ECHOCARDIOGRAM LIMITED REPORT   Patient Name:   Jeffrey Allen Date of Exam: 06/23/2022 Medical Rec #:  [id number]       Height:       67.0 in Accession #:    [id number]      Weight:       191.4 lb Date of Birth:  1956-12-22       BSA:          1.985 m Patient Age:    23 years        BP:           162/92 mmHg Patient Gender: M               HR:           67 bpm. Exam Location:  [facility] Procedure: Limited Echo Indications:    R07.9 Chest Pain, Eval LVEF and Wall Motion  History:        Patient has prior history of Echocardiogram examinations, most                 recent 05/26/2022. Previous Myocardial Infarction, Stroke; Risk                 Factors:Hypertension, Diabetes and Dyslipidemia. COVID-19 virus                 infection.  Sonographer:    Alvino Chapel RCS Referring Phys: [id number] [place]  1. Limited echo for LVEF  2. Left ventricular ejection fraction, by estimation, is 45 to 50%. Left ventricular ejection fraction by 2D  MOD biplane is 47.4 %. The left ventricle has mildly decreased function. The left ventricle demonstrates global hypokinesis.  3. The inferior vena cava is normal in size with greater than 50% respiratory variability, suggesting right atrial pressure of 3 mmHg. Comparison(s): Changes from prior study are noted. 05/26/2022: LVEF 50-55%. FINDINGS  Left Ventricle: Left ventricular ejection fraction, by estimation, is 45 to 50%. Left ventricular ejection fraction by 2D MOD biplane is 47.4 %. The left ventricle has mildly decreased function. The left ventricle demonstrates global hypokinesis. The left ventricular internal cavity size was normal in size. There is no left ventricular hypertrophy. Venous: The inferior vena cava is normal in size with greater than 50% respiratory variability, suggesting right atrial pressure of 3 mmHg. LEFT VENTRICLE PLAX 2D                        Biplane EF (MOD) LVIDd:         5.70 cm         LV Biplane EF:   Left LVIDs:         2.90 cm                          ventricular LV PW:         1.10 cm                          ejection LV IVS:        1.00 cm                          fraction by  2D MOD                                                 biplane is LV Volumes (MOD)                                47.4 %. LV vol d, MOD    139.0 ml A2C: LV vol d, MOD    153.9 ml A4C: LV vol s, MOD    76.6 ml A2C: LV vol s, MOD    77.0 ml A4C: LV SV MOD A2C:   62.5 ml LV SV MOD A4C:   153.9 ml LV SV MOD BP:    69.7 ml LEFT ATRIUM         Index LA diam:    4.80 cm 2.42 cm/m   AORTA Ao Root diam: 3.20 cm Lyman Bishop MD Electronically signed by Lyman Bishop MD Signature Date/Time: 06/23/2022/5:00:39 PM    Final    DG Chest Port 1 View  Result Date: 06/23/2022 CLINICAL DATA:  Mid upper abdominal pain since yesterday shortness of breath, lightheadedness EXAM: PORTABLE CHEST 1 VIEW COMPARISON:  Chest radiograph 05/26/2019 FINDINGS: The cardiomediastinal  silhouette is stable. There is no focal consolidation or pulmonary edema. There is no pleural effusion or pneumothorax There is no acute osseous abnormality. IMPRESSION: No radiographic evidence of acute cardiopulmonary process. Electronically Signed   By: Valetta Mole M.D.   On: 06/23/2022 08:28      Patient Profile     66 y.o. male 6with a hx of CAD (s/p cath on 05/26/2022 showing severe 3-vessel CAD with DESx1 to mid/distal LAD, DESx1 to D1 and DESx2 to distal LCx), HTN, HLD, prediabetes and prior CVA  Assessment & Plan      Heart Failure with mildly reduced Ejection Fraction  CAD with recent PCI Statin myalgia Hx of stroke Frequent PVCs  - NYHA class II, Stage C, hypervolemic, etiology from PVCs favored over  - Diuretic regimen: Lasix 40 IV BID  - Discussed the importance of fluid restriction of < 2 L, salt restriction, and checking daily weights  -  Replace electrolytes PRN and keep K>4 and Mg>2.  - Coreg/bisoprolol/metoprolol deferred despite PVCs due to bradycardia - adding losartan 25 mg PO Daily today - SGLT2i would be reasonable this admission  - would continue amiodarone 400 mg PO BID then 400 mg PO daily for PVC supression; long term could be reasonable candidate for PVC ablation  - continue DAPT and zetia  (tolerate outpatient RYR extract) - outpatient needs PCSK9i consideration  Reviewed with primary MD and bedside nursing   For questions or updates, please contact [facility] Please consult [email or web address] for contact info under Cardiology/STEMI.      Rudean Haskell, MD [id number] [place], [place] [address] [phone]  8:29 AM

## 2022-06-24 NOTE — Progress Notes (Signed)
Cardiologist at bedside. Bryson Corona Edd Fabian

## 2022-06-24 NOTE — Progress Notes (Signed)
Pt held Gabapentin at time of med administration and failed to inform this RN that he did not want to take it. He prefers to take Gabapentin at night due to sleepiness side effect. This RN messaged pharmacy to have it moved to PM as well as his Zetia starting tomorrow. Bryson Corona Edd Fabian

## 2022-06-24 NOTE — Progress Notes (Signed)
Upon checking vitals signs prior to medication administration this RN obtains a pulse of 25 from pulse oximeter. This  RN palpated for 1 full min on the R radial pulse and obtained a rate of 39 and 47 palpated on L radial pulse. Pulse is irregular and can be felt upon palpation. Pt denies shortness of breath and states he "feel weird" in left chest but not actual pain. Charge RN informed. Bryson Corona Edd Fabian

## 2022-06-25 DIAGNOSIS — R079 Chest pain, unspecified: Secondary | ICD-10-CM | POA: Diagnosis not present

## 2022-06-25 DIAGNOSIS — I5023 Acute on chronic systolic (congestive) heart failure: Secondary | ICD-10-CM | POA: Diagnosis not present

## 2022-06-25 LAB — RENAL FUNCTION PANEL
Albumin: 4.1 g/dL (ref 3.5–5.0)
Anion gap: 9 (ref 5–15)
BUN: 34 mg/dL — ABNORMAL HIGH (ref 8–23)
CO2: 26 mmol/L (ref 22–32)
Calcium: 9.5 mg/dL (ref 8.9–10.3)
Chloride: 102 mmol/L (ref 98–111)
Creatinine, Ser: 1.04 mg/dL (ref 0.61–1.24)
GFR, Estimated: >60 mL/min (ref >60)
Glucose, Bld: 94 mg/dL (ref 70–99)
Phosphorus: 3.8 mg/dL (ref 2.5–4.6)
Potassium: 3.6 mmol/L (ref 3.5–5.1)
Sodium: 137 mmol/L (ref 135–145)

## 2022-06-25 MED ORDER — FUROSEMIDE 20 MG PO TABS: 20.0000 mg | ORAL_TABLET | ORAL | 4 refills | Status: DC

## 2022-06-25 MED ORDER — CLOPIDOGREL BISULFATE 75 MG PO TABS: 75.0000 mg | ORAL_TABLET | Freq: Every day | ORAL | 4 refills | Status: DC

## 2022-06-25 MED ORDER — ASPIRIN 81 MG PO TBEC: 81.0000 mg | DELAYED_RELEASE_TABLET | Freq: Every day | ORAL | 12 refills | Status: DC

## 2022-06-25 MED ORDER — AMIODARONE HCL 400 MG PO TABS: 400.0000 mg | ORAL_TABLET | ORAL | 1 refills | Status: DC

## 2022-06-25 MED ORDER — LOSARTAN POTASSIUM 25 MG PO TABS: 25.0000 mg | ORAL_TABLET | Freq: Every day | ORAL | 3 refills | Status: DC

## 2022-06-25 NOTE — Discharge Instructions (Signed)
1)Take Amiodarone 400 mg BiD for 7 days, then 400 mg daily after than until you see Dr. Domenic Polite (Cardiology)  2)Take Lasix 40 mg in the morning and 20 mg in the evening  3)Very low-salt diet advised less than 2 g of sodium per day  4)Weigh yourself daily, call if you gain more than 3 pounds in 1 day or more than 5 pounds in 1 week as your diuretic medications may need to be adjusted  5)Limit your Fluid  intake to no more than 60 ounces (1.8 Liters) per day  6)Take 400 mg BiD for 7 days, then 400 mg daily after than until you see Dr. Domenic Polite (Cardiology) -Take Lasix 40 mg in the morning and 20 mg in the evening  7) your cardiologist may decide to add SGLT2i type medications like Farxiga-at the next visit  8) your cardiologist may decide to add PCSK9i type medication like Repatha at your next cardiology visit to treat your cholesterol and help reduce your risk for heart attack-   9) if the amiodarone is not adequately suppressing your extra heartbeats--- Dr. Domenic Polite (general cardiology) may refer you to EP/electrophysiology cardiology to discuss further options including medications or even possible PVC ablation down the road  10) repeat CBC and CMP blood test when you see Dr. Domenic Polite [facility] cardiology) in a couple of weeks from now  29) you are already taking aspirin and Plavix which are blood thinner so please Avoid ibuprofen/Advil/Aleve/Motrin/Goody Powders/Naproxen/BC powders/Meloxicam/Diclofenac/Indomethacin and other Nonsteroidal anti-inflammatory medications as these will make you more likely to bleed and can cause stomach ulcers, can also cause Kidney problems.

## 2022-06-25 NOTE — Discharge Summary (Incomplete)
Jeffrey Allen, is a 66 y.o. male  DOB Dec 11, 1956  MRN [id number].  Admission date:  06/23/2022  Admitting Physician  Roxan Hockey, MD  Discharge Date:  06/25/2022   Primary MD  [place], MD  Recommendations for primary care physician for things to follow:   1)Take Amiodarone 400 mg BiD for 7 days, then 400 mg daily after than until you see Dr. Domenic Polite (Cardiology)  2)Take Lasix 40 mg in the morning and 20 mg in the evening  3)Very low-salt diet advised less than 2 g of sodium per day  4)Weigh yourself daily, call if you gain more than 3 pounds in 1 day or more than 5 pounds in 1 week as your diuretic medications may need to be adjusted  5)Limit your Fluid  intake to no more than 60 ounces (1.8 Liters) per day  6)Take 400 mg BiD for 7 days, then 400 mg daily after than until you see Dr. Domenic Polite (Cardiology) -Take Lasix 40 mg in the morning and 20 mg in the evening  7) your cardiologist may decide to add SGLT2i type medications like Farxiga-at the next visit  8) your cardiologist may decide to add PCSK9i type medication like Repatha at your next cardiology visit to treat your cholesterol and help reduce your risk for heart attack-   9) if the amiodarone is not adequately suppressing your extra heartbeats--- Dr. Domenic Polite (general cardiology) may refer you to EP/electrophysiology cardiology to discuss further options including medications or even possible PVC ablation down the road  10) repeat CBC and CMP blood test when you see Dr. Domenic Polite [facility] cardiology) in a couple of weeks from now  58) you are already taking aspirin and Plavix which are blood thinner so please Avoid ibuprofen/Advil/Aleve/Motrin/Goody Powders/Naproxen/BC powders/Meloxicam/Diclofenac/Indomethacin and other Nonsteroidal anti-inflammatory medications as these will make you more likely to bleed and can cause stomach  ulcers, can also cause Kidney problems.   Admission Diagnosis  Acute exacerbation of CHF (congestive heart failure) (HCC) [I50.9]   Discharge Diagnosis  Acute exacerbation of CHF (congestive heart failure) ([place]) [I50.9]  ***  Principal Problem:   Acute exacerbation of CHF (congestive heart failure) (HCC) Active Problems:   Chest pain      Past Medical History:  Diagnosis Date   CAD (coronary artery disease)    a. 05/2022: DES placement x1 to the mid/distal LAD, DES placement x1 to D1 and DES placement x2 to the distal LCx   GERD (gastroesophageal reflux disease)    Gout    High cholesterol    Obesity (BMI 30.0-34.9)    Peptic ulcer disease    Prediabetes    Stroke [facility]) 06/2017    Past Surgical History:  Procedure Laterality Date   BIOPSY  11/25/2020   Procedure: BIOPSY;  Surgeon: Daneil Dolin, MD;  [facility];  Service: Endoscopy;;  gastric   COLONOSCOPY     COLONOSCOPY N/A 12/07/2016   Scattered small and large-mouthed diverticula in sigmoid and descending colon. Six semi-pedunculated polyps in  sigmoid and ascending colon, 4-6 mm in size. Distal TI normal appearing. Hyperplastic and tubular adenomas. 3 year surveillance recommended.    COLONOSCOPY N/A 11/25/2020   diverticulosis in sigmoid and descending colon, three 3-4 mm polyps at rectosigmoid colon and hepatic flexure. Hyperplastic polyp. 5 year colonoscopy.    CORONARY STENT INTERVENTION N/A 05/26/2022   Procedure: CORONARY STENT INTERVENTION;  Surgeon: Burnell Blanks, MD;  Location: [place] CV LAB;  Service: Cardiovascular;  Laterality: N/A;   ESOPHAGOGASTRODUODENOSCOPY N/A 11/25/2020     normal esophagus, portal gastropathy, normal duodenum. Mild chronic gastritis, negative H.pylori.    LEFT HEART CATH AND CORS/GRAFTS ANGIOGRAPHY N/A 05/26/2022   Procedure: LEFT HEART CATH AND CORS/GRAFTS ANGIOGRAPHY;  Surgeon: Burnell Blanks, MD;  Location: [place] CV LAB;  Service:  Cardiovascular;  Laterality: N/A;   POLYPECTOMY  11/25/2020   Procedure: POLYPECTOMY;  Surgeon: Daneil Dolin, MD;  [facility];  Service: Endoscopy;;   TOOTH EXTRACTION         HPI  from the history and physical done on the day of admission:     ***  ****     Hospital Course:     No notes on file  ***** Assessment and Plan: No notes have been filed under this hospital service. Service: Hospitalist       Discharge Condition: ***  Follow UP   Follow-up Information     [name], MD. Schedule an appointment as soon as possible for a visit in 2 week(s).   Specialty: Cardiology Why: for recheck Contact information: [place] [address] [phone]                  Consults obtained - ***  Diet and Activity recommendation:  As advised  Discharge Instructions    **** Discharge Instructions     Call MD for:  difficulty breathing, headache or visual disturbances   Complete by: As directed    Call MD for:  persistant dizziness or light-headedness   Complete by: As directed    Call MD for:  persistant nausea and vomiting   Complete by: As directed    Call MD for:  temperature >100.4   Complete by: As directed    Diet - low sodium heart healthy   Complete by: As directed    Discharge instructions   Complete by: As directed    1)Take Amiodarone 400 mg BiD for 7 days, then 400 mg daily after than until you see Dr. Domenic Polite (Cardiology)  2)Take Lasix 40 mg in the morning and 20 mg in the evening  3)Very low-salt diet advised less than 2 g of sodium per day  4)Weigh yourself daily, call if you gain more than 3 pounds in 1 day or more than 5 pounds in 1 week as your diuretic medications may need to be adjusted  5)Limit your Fluid  intake to no more than 60 ounces (1.8 Liters) per day  6)Take 400 mg BiD for 7 days, then 400 mg daily after than until you see Dr. Domenic Polite (Cardiology) -Take Lasix 40 mg in  the morning and 20 mg in the evening  7) your cardiologist may decide to add SGLT2i type medications like Farxiga-at the next visit  8) your cardiologist may decide to add PCSK9i type medication like Repatha at your next cardiology visit to treat your cholesterol and help reduce your risk for heart attack-   9) if the amiodarone is not adequately suppressing  your extra heartbeats--- Dr. Domenic Polite (general cardiology) may refer you to EP/electrophysiology cardiology to discuss further options including medications or even possible PVC ablation down the road  10) repeat CBC and CMP blood test when you see Dr. Domenic Polite [facility] cardiology) in a couple of weeks from now  67) you are already taking aspirin and Plavix which are blood thinner so please Avoid ibuprofen/Advil/Aleve/Motrin/Goody Powders/Naproxen/BC powders/Meloxicam/Diclofenac/Indomethacin and other Nonsteroidal anti-inflammatory medications as these will make you more likely to bleed and can cause stomach ulcers, can also cause Kidney problems.   Increase activity slowly   Complete by: As directed          Discharge Medications     Allergies as of 06/25/2022       Reactions   Prednisone Other (See Comments)   Peptic ulcers   Statins         Medication List     STOP taking these medications    acebutolol 200 MG capsule Commonly known as: SECTRAL       TAKE these medications    allopurinol 300 MG tablet Commonly known as: ZYLOPRIM Take 0.5 tablets (150 mg total) by mouth daily.   amiodarone 400 MG tablet Commonly known as: PACERONE Take 1 tablet (400 mg total) by mouth See admin instructions. Take 400 mg BiD for 7 days, then 400 mg daily after than until you see Dr. Domenic Polite (Cardiology)   aspirin EC 81 MG tablet Take 1 tablet (81 mg total) by mouth daily with breakfast. What changed: when to take this   clopidogrel 75 MG tablet Commonly known as: PLAVIX Take 1 tablet (75 mg total) by mouth daily.    ezetimibe 10 MG tablet Commonly known as: ZETIA Take 1 tablet (10 mg total) by mouth daily.   furosemide 20 MG tablet Commonly known as: Lasix Take 1 tablet (20 mg total) by mouth See admin instructions. Take Lasix 40 mg in the morning and 20 mg in the evening   gabapentin 100 MG capsule Commonly known as: NEURONTIN TAKE 1 CAPSULE BY MOUTH THREE TIMES A DAY. What changed: See the new instructions.   Garlic 1660 MG Caps Take 1,000 mg by mouth daily.   losartan 25 MG tablet Commonly known as: COZAAR Take 1 tablet (25 mg total) by mouth daily. Start taking on: June 26, 2022   nitroGLYCERIN 0.4 MG SL tablet Commonly known as: NITROSTAT Place 1 tablet (0.4 mg total) under the tongue every 5 (five) minutes as needed for chest pain.   pantoprazole 40 MG tablet Commonly known as: PROTONIX Take 1 tablet (40 mg total) by mouth daily.   PROBIOTIC-10 PO Take 1 tablet by mouth daily. 18 thousand   RED YEAST RICE PO Take 2 tablets by mouth daily.   vitamin B-12 1000 MCG tablet Commonly known as: CYANOCOBALAMIN Take 1,000 mcg by mouth daily.   VITAMIN C PO Take 1 tablet by mouth daily.   Vitamin D3 125 MCG (5000 UT) Tabs Take 10,000 Units by mouth daily.        Major procedures and Radiology Reports - PLEASE review detailed and final reports for all details, in brief -   ***  CT Angio Chest Pulmonary Embolism (PE) W or WO Contrast  Result Date: 06/24/2022 CLINICAL DATA:  Chest pain, shortness of breath EXAM: CT ANGIOGRAPHY CHEST WITH CONTRAST TECHNIQUE: Multidetector CT imaging of the chest was performed using the standard protocol during bolus administration of intravenous contrast. Multiplanar CT image reconstructions and MIPs were obtained to evaluate  the vascular anatomy. RADIATION DOSE REDUCTION: This exam was performed according to the departmental dose-optimization program which includes automated exposure control, adjustment of the mA and/or kV according to patient  size and/or use of iterative reconstruction technique. CONTRAST:  95m OMNIPAQUE IOHEXOL 350 MG/ML SOLN COMPARISON:  Chest radiographs done on 06/23/2022 FINDINGS: Cardiovascular: There is homogeneous enhancement in thoracic aorta. Ascending thoracic aorta measures 3.7 cm. There are scattered coronary artery calcifications. There are no intraluminal filling defects in the pulmonary artery branches. Mediastinum/Nodes: Unremarkable. Lungs/Pleura: There is no focal pulmonary consolidation. There is no pleural effusion or pneumothorax. Upper Abdomen: Small low-density lesions are seen in the upper aspect of liver possibly cysts or hemangiomas. There is no dilation of bile ducts. Gallbladder is unremarkable. There is 2 mm calculus in the upper pole of left kidney. Diverticula are seen in the colon. Musculoskeletal: No acute findings are seen. Review of the MIP images confirms the above findings. IMPRESSION: There is no evidence of pulmonary artery embolism. There is no evidence of thoracic aortic dissection. There is no focal pulmonary consolidation. Coronary artery calcifications are seen. There is ectasia of ascending thoracic aorta measuring 3.7 cm. Possible cyst or hemangioma in the liver. Small left renal stone. Diverticulosis of colon. Other findings as described in the body of the report. Electronically Signed   By: PElmer PickerM.D.   On: 06/24/2022 14:44   ECHOCARDIOGRAM LIMITED  Result Date: 06/23/2022    ECHOCARDIOGRAM LIMITED REPORT   Patient Name:   DKAMILO OCHDate of Exam: 06/23/2022 Medical Rec #:  [id number]      Height:       67.0 in Accession #:    [id number]     Weight:       191.4 lb Date of Birth:  [birth year]      BSA:          1.985 m Patient Age:    89years        BP:           162/92 mmHg Patient Gender: M               HR:           67 bpm. [facility]  [facility]cedure: Limited Echo Indications:    R07.9 Chest Pain, Eval LVEF and Wall Motion  History:        Patient has  prior history of Echocardiogram examinations, most                 recent 05/26/2022. Previous Myocardial Infarction, Stroke; Risk                 Factors:Hypertension, Diabetes and Dyslipidemia. COVID-19 virus                 infection.  Sonographer:    BAlvino ChapelRCS Referring Phys: [id number] 1. Limited echo for LVEF  2. Left ventricular ejection fraction, by estimation, is 45 to 50%. Left ventricular ejection fraction by 2D MOD biplane is 47.4 %. The left ventricle has mildly decreased function. The left ventricle demonstrates global hypokinesis.  3. The inferior vena cava is normal in size with greater than 50% respiratory variability, suggesting right atrial pressure of 3 mmHg. Comparison(s): Changes from prior study are noted. 05/26/2022: LVEF 50-55%. FINDINGS  Left Ventricle: Left ventricular ejection fraction, by estimation, is 45 to 50%. Left ventricular ejection fraction by 2D MOD biplane is 47.4 %. The  left ventricle has mildly decreased function. The left ventricle demonstrates global hypokinesis. The left ventricular internal cavity size was normal in size. There is no left ventricular hypertrophy. Venous: The inferior vena cava is normal in size with greater than 50% respiratory variability, suggesting right atrial pressure of 3 mmHg. LEFT VENTRICLE PLAX 2D                        Biplane EF (MOD) LVIDd:         5.70 cm         LV Biplane EF:   Left LVIDs:         2.90 cm                          ventricular LV PW:         1.10 cm                          ejection LV IVS:        1.00 cm                          fraction by                                                 2D MOD                                                 biplane is LV Volumes (MOD)                                47.4 %. LV vol d, MOD    139.0 ml A2C: LV vol d, MOD    153.9 ml A4C: LV vol s, MOD    76.6 ml A2C: LV vol s, MOD    77.0 ml A4C: LV SV MOD A2C:   62.5 ml LV SV MOD A4C:   153.9 ml LV SV MOD BP:     69.7 ml LEFT ATRIUM         Index LA diam:    4.80 cm 2.42 cm/m   AORTA Ao Root diam: 3.20 cm Lyman Bishop MD Electronically signed by Lyman Bishop MD Signature Date/Time: 06/23/2022/5:00:39 PM    Final    DG Chest Port 1 View  Result Date: 06/23/2022 CLINICAL DATA:  Mid upper abdominal pain since yesterday shortness of breath, lightheadedness EXAM: PORTABLE CHEST 1 VIEW COMPARISON:  Chest radiograph 05/26/2019 FINDINGS: The cardiomediastinal silhouette is stable. There is no focal consolidation or pulmonary edema. There is no pleural effusion or pneumothorax There is no acute osseous abnormality. IMPRESSION: No radiographic evidence of acute cardiopulmonary process. Electronically Signed   By: Valetta Mole M.D.   On: 06/23/2022 08:28   LONG TERM MONITOR-LIVE TELEMETRY (3-14 DAYS)  Result Date: 06/19/2022 ZIO AT reviewed.  11 days, 2 hours analyzed. 1.  Predominant rhythm is sinus with prolonged PR interval, heart rate ranging from 36 bpm up to 122 bpm with average heart rate 71 bpm. 2.  There were  occasional PACs representing 2.7% total beats with otherwise rare atrial couplets and triplets representing less than 1% total beats. 3.  There were frequent PVCs representing 17.1% total beats with occasional ventricular couplets representing 4.8% total beats, and otherwise rare ventricular triplets representing less than 1% total beats.  Ventricular bigeminy and trigeminy were also noted. 4.  Multiple episodes of NSVT were noted.  Longest episode was described at 22 seconds, however looks to be more of a combination of fusion beats with more brief episodes of NSVT, rather than a sustained event. 5.  There were 10 episodes of PSVT, the longest of which was 16 beats. 6.  Patient triggered episodes loosely correlated with PVCs.  CARDIAC CATHETERIZATION  Result Date: 05/26/2022   Ost RCA to Prox RCA lesion is 50% stenosed.   Mid Cx lesion is 99% stenosed.   Prox Cx lesion is 70% stenosed.   1st Diag lesion is  80% stenosed.   Dist LAD lesion is 90% stenosed.   A drug-eluting stent was successfully placed using a STENT ONYX FRONTIER 2.25X18.   A drug-eluting stent was successfully placed using a STENT ONYX FRONTIER 3.0X12.   A drug-eluting stent was successfully placed using a SYNERGY XD 2.25X16.   A drug-eluting stent was successfully placed using a SYNERGY XD 2.25X12.   Post intervention, there is a 0% residual stenosis.   Post intervention, there is a 0% residual stenosis.   Post intervention, there is a 0% residual stenosis.   Post intervention, there is a 0% residual stenosis. Severe mid to distal LAD stenosis. Successful PTCA/DES x 1 mid to distal LAD Severe Diagonal 1 stenosis. Successful PTCA/DES x 1 Diagonal Severe distal Circumflex stenosis. Successful PTCA/DES x 2 distal Circumflex Moderate ostial RCA stenosis. Large dominant RCA Recommendations: Continue DAPT with ASA and Brilinta for one year. Continue statin and beta blocker.    Micro Results   *** No results found for this or any previous visit (from the past 240 hour(s)).  Today   Subjective    Geovani Tootle today has no ***          Patient has been seen and examined prior to discharge   Objective   Blood pressure (!) 125/91, pulse 60, temperature 97.9 F (36.6 C), temperature source Oral, resp. rate 17, height '5\' 7"'$  (1.702 m), weight 84.8 kg, SpO2 99 %.   Intake/Output Summary (Last 24 hours) at 06/25/2022 1155 Last data filed at 06/24/2022 2100 Gross per 24 hour  Intake 360 ml  Output --  Net 360 ml    Exam Gen:- Awake Alert, no acute distress *** HEENT:- [place].AT, No sclera icterus Neck-Supple Neck,No JVD,.  Lungs-  CTAB , good air movement bilaterally CV- S1, S2 normal, regular Abd-  +ve B.Sounds, Abd Soft, No tenderness,    Extremity/Skin:- No  edema,   good pulses Psych-affect is appropriate, oriented x3 Neuro-no new focal deficits, no tremors ***   Data Review   CBC w Diff:  Lab Results  Component Value Date    WBC 8.5 06/24/2022   HGB 17.3 (H) 06/24/2022   HGB 17.1 05/24/2022   HCT 49.8 06/24/2022   HCT 50.2 05/24/2022   PLT 170 06/24/2022   PLT 201 05/24/2022   LYMPHOPCT 20 06/23/2022   MONOPCT 8 06/23/2022   EOSPCT 3 06/23/2022   BASOPCT 1 06/23/2022    CMP:  Lab Results  Component Value Date   NA 137 06/25/2022   NA 141 05/24/2022   K 3.6 06/25/2022  CL 102 06/25/2022   CO2 26 06/25/2022   BUN 34 (H) 06/25/2022   BUN 25 05/24/2022   CREATININE 1.04 06/25/2022   CREATININE 1.07 02/16/2021   PROT 6.7 06/23/2022   PROT 6.7 05/24/2022   ALBUMIN 4.1 06/25/2022   ALBUMIN 4.2 05/24/2022   BILITOT 0.9 06/23/2022   BILITOT 0.4 05/24/2022   ALKPHOS 48 06/23/2022   AST 20 06/23/2022   ALT 25 06/23/2022  .  Total Discharge time is about 33 minutes  Roxan Hockey M.D on 06/25/2022 at 11:55 AM  Go to [email or web address] -  for contact info  [facility] - Office  [phone]

## 2022-06-26 DIAGNOSIS — I2510 Atherosclerotic heart disease of native coronary artery without angina pectoris: Secondary | ICD-10-CM

## 2022-06-29 ENCOUNTER — Telehealth: Payer: Self-pay | Admitting: Cardiology

## 2022-06-29 NOTE — Telephone Encounter (Signed)
Nurse appointment made for 2 weeks after starting amiodarone 400 mg bid on 06/23/22 for Thursday, July 13 th at 4 pm

## 2022-06-29 NOTE — Telephone Encounter (Signed)
Patient's wife states after the patient was seen in the hospital he has still been having extreme PVC's. She says he left the hospital with new medications and they are monitoring him but his PCP wanted him to have an ekg within 2 weeks at most. I did not see anything sooner and she would like to know if he can be worked in.

## 2022-07-07 ENCOUNTER — Ambulatory Visit (INDEPENDENT_AMBULATORY_CARE_PROVIDER_SITE_OTHER): Payer: 59

## 2022-07-07 VITALS — Ht 67.0 in | Wt 190.0 lb

## 2022-07-07 DIAGNOSIS — I493 Ventricular premature depolarization: Secondary | ICD-10-CM

## 2022-07-07 NOTE — Progress Notes (Signed)
Patient presents today for EKG nurse visit. Pt was started on Amiodarone on 06/23/2022.  Patient stated that he feels drained today.

## 2022-07-13 ENCOUNTER — Telehealth: Payer: Self-pay | Admitting: Cardiology

## 2022-07-13 NOTE — Telephone Encounter (Signed)
Jeffrey Allen has been burning logs outside for the past 2 weeks and going to bible school at [employer] and has overdone self. He was advised to stay inside during bad air alerts and high temps. He agrees to stay in the house and drink fluids.

## 2022-07-13 NOTE — Telephone Encounter (Signed)
STAT if HR is under 50 or over 120 (normal HR is 60-100 beats per minute)  What is your heart rate?   Do you have a log of your heart rate readings (document readings)?  [phone] - while on the phone  Do you have any other symptoms? Pt wife states pt says he feels lightheaded and very hot in the face. She states that he has no energy.

## 2022-07-18 ENCOUNTER — Other Ambulatory Visit: Payer: Self-pay | Admitting: Cardiology

## 2022-07-19 ENCOUNTER — Encounter: Payer: Self-pay | Admitting: Student

## 2022-07-19 ENCOUNTER — Ambulatory Visit (INDEPENDENT_AMBULATORY_CARE_PROVIDER_SITE_OTHER): Payer: 59 | Admitting: Student

## 2022-07-19 VITALS — BP 122/76 | HR 64 | Ht 67.0 in | Wt 199.6 lb

## 2022-07-19 DIAGNOSIS — I255 Ischemic cardiomyopathy: Secondary | ICD-10-CM | POA: Diagnosis not present

## 2022-07-19 DIAGNOSIS — I25118 Atherosclerotic heart disease of native coronary artery with other forms of angina pectoris: Secondary | ICD-10-CM

## 2022-07-19 DIAGNOSIS — I10 Essential (primary) hypertension: Secondary | ICD-10-CM

## 2022-07-19 DIAGNOSIS — E785 Hyperlipidemia, unspecified: Secondary | ICD-10-CM

## 2022-07-19 DIAGNOSIS — I493 Ventricular premature depolarization: Secondary | ICD-10-CM | POA: Diagnosis not present

## 2022-07-19 MED ORDER — FUROSEMIDE 40 MG PO TABS: 40.0000 mg | ORAL_TABLET | Freq: Every day | ORAL | 3 refills | Status: DC

## 2022-07-19 MED ORDER — AMIODARONE HCL 200 MG PO TABS: 200.0000 mg | ORAL_TABLET | Freq: Two times a day (BID) | ORAL | 2 refills | Status: DC

## 2022-07-19 MED ORDER — LOSARTAN POTASSIUM 25 MG PO TABS: 25.0000 mg | ORAL_TABLET | Freq: Every day | ORAL | 1 refills | Status: AC

## 2022-07-19 NOTE — Patient Instructions (Signed)
Medication Instructions:   Take Lasix 40 mg Daily   *If you need a refill on your cardiac medications before your next appointment, please call your pharmacy*   Lab Work: Your physician recommends that you return for lab work in: Fasting ( Lipid, CMET)   If you have labs (blood work) drawn today and your tests are completely normal, you will receive your results only by: [place] (if you have MyChart) OR A paper copy in the mail If you have any lab test that is abnormal or we need to change your treatment, we will call you to review the results.   Testing/Procedures: NONE    Follow-Up: At [facility], you and your health needs are our priority.  As part of our continuing mission to provide you with exceptional heart care, we have created designated Provider Care Teams.  These Care Teams include your primary Cardiologist (physician) and Advanced Practice Providers (APPs -  Physician Assistants and Nurse Practitioners) who all work together to provide you with the care you need, when you need it.  We recommend signing up for the patient portal called "MyChart".  Sign up information is provided on this After Visit Summary.  MyChart is used to connect with patients for Virtual Visits (Telemedicine).  Patients are able to view lab/test results, encounter notes, upcoming appointments, etc.  Non-urgent messages can be sent to your provider as well.   To learn more about what you can do with MyChart, go to [email or web address].    Your next appointment:   3 month(s)  The format for your next appointment:   In Person  Provider:   Rozann Lesches, MD    Other Instructions Thank you for choosing [facility]!    Important Information About Sugar

## 2022-07-19 NOTE — Progress Notes (Signed)
Cardiology Office Note    Date:  07/19/2022   ID:  Jeffrey Allen, Jeffrey Allen 11-21-1956, MRN [id number]  PCP:  [place] Nation, MD  Cardiologist: Rozann Lesches, MD    Chief Complaint  Patient presents with   Hospitalization Follow-up    History of Present Illness:    Jeffrey Allen is a 66 y.o. male with past medical history of CAD (s/p cath on 05/26/2022 showing severe 3-vessel CAD with DESx1 to mid/distal LAD, DESx1 to D1 and DESx2 to distal LCx), PVC's (17% burden by recent monitor), HTN, HLD, prediabetes and prior CVA who presents to the office today for hospital follow-up.  He most recently presented to [facility] ED on 06/23/2022 for evaluation of worsening abdominal pain, dyspnea and dizziness. Reported that his dyspnea had significantly improved after being switched from Brilinta to Plavix but his weight had increased by over 5 pounds within the past week. He also reported worsening bradycardia and fatigue since being started on Acebutolol for his PVC's. Given his heart rate in the 30's, Acebutolol was discontinued and he was ultimately started on Amiodarone 400 mg twice daily for 1 week followed by 400 mg daily. Was also started on IV Lasix for diuresis as repeat limited echocardiogram showed that his EF was mildly reduced at 45- 50% and felt to be secondary to PVC's. He was started on Lasix 40 mg in AM/20 mg in PM at the time of discharge and was started on Losartan 25 mg daily. He did have a nurse visit for an EKG on 7/13 and this showed sinus bradycardia, HR 51 with 1st degree AV block and no PVC's. QTc at 554 ms and corrected at 516 ms when calculated.  In talking with the patient and his wife today, he reports that his overall energy level has improved since being started on Amiodarone for his PVC's. Says his heart rate is still in the 50's at times. Reports an occasional shooting pain but denies any symptoms resembling his prior angina. Says that his breathing has been stable with  no specific orthopnea, PND or pitting edema. Has intermittent fatigue but says he has been working outside frequently over the past few weeks in the warm temperatures.  Past Medical History:  Diagnosis Date   CAD (coronary artery disease)    a. 05/2022: DES placement x1 to the mid/distal LAD, DES placement x1 to D1 and DES placement x2 to the distal LCx   GERD (gastroesophageal reflux disease)    Gout    High cholesterol    Obesity (BMI 30.0-34.9)    Peptic ulcer disease    Prediabetes    Stroke [facility]) 06/2017    Past Surgical History:  Procedure Laterality Date   BIOPSY  11/25/2020   Procedure: BIOPSY;  Surgeon: Daneil Dolin, MD;  [facility];  Service: Endoscopy;;  gastric   COLONOSCOPY     COLONOSCOPY N/A 12/07/2016   Scattered small and large-mouthed diverticula in sigmoid and descending colon. Six semi-pedunculated polyps in sigmoid and ascending colon, 4-6 mm in size. Distal TI normal appearing. Hyperplastic and tubular adenomas. 3 year surveillance recommended.    COLONOSCOPY N/A 11/25/2020   diverticulosis in sigmoid and descending colon, three 3-4 mm polyps at rectosigmoid colon and hepatic flexure. Hyperplastic polyp. 5 year colonoscopy.    CORONARY STENT INTERVENTION N/A 05/26/2022   Procedure: CORONARY STENT INTERVENTION;  Surgeon: Burnell Blanks, MD;  Location: [place] [facility] LAB;  Service: Cardiovascular;  Laterality: N/A;  ESOPHAGOGASTRODUODENOSCOPY N/A 11/25/2020     normal esophagus, portal gastropathy, normal duodenum. Mild chronic gastritis, negative H.pylori.    LEFT HEART CATH AND CORS/GRAFTS ANGIOGRAPHY N/A 05/26/2022   Procedure: LEFT HEART CATH AND CORS/GRAFTS ANGIOGRAPHY;  Surgeon: Burnell Blanks, MD;  Location: [place] [facility];  Service: Cardiovascular;  Laterality: N/A;   POLYPECTOMY  11/25/2020   Procedure: POLYPECTOMY;  Surgeon: Daneil Dolin, MD;  [facility];  Service: Endoscopy;;   TOOTH EXTRACTION       Current Medications: Outpatient Medications Prior to Visit  Medication Sig Dispense Refill   allopurinol (ZYLOPRIM) 300 MG tablet Take 0.5 tablets (150 mg total) by mouth daily. 30 tablet 1   Ascorbic Acid (VITAMIN C PO) Take 1 tablet by mouth daily.     aspirin EC 81 MG tablet Take 1 tablet (81 mg total) by mouth daily with breakfast. 30 tablet 12   Cholecalciferol (VITAMIN D3) 125 MCG (5000 UT) TABS Take 10,000 Units by mouth daily.      clopidogrel (PLAVIX) 75 MG tablet Take 1 tablet (75 mg total) by mouth daily. 30 tablet 4   ezetimibe (ZETIA) 10 MG tablet TAKE (1) TABLET BY MOUTH ONCE DAILY. 30 tablet 5   gabapentin (NEURONTIN) 100 MG capsule TAKE 1 CAPSULE BY MOUTH THREE TIMES A DAY. (Patient taking differently: Take 100 mg by mouth daily.) 90 capsule 0   Garlic 4166 MG CAPS Take 1,000 mg by mouth daily.     nitroGLYCERIN (NITROSTAT) 0.4 MG SL tablet Place 1 tablet (0.4 mg total) under the tongue every 5 (five) minutes as needed for chest pain. 25 tablet 2   pantoprazole (PROTONIX) 40 MG tablet Take 1 tablet (40 mg total) by mouth daily. 90 tablet 1   Probiotic Product (PROBIOTIC-10 PO) Take 1 tablet by mouth daily. 18 thousand     Red Yeast Rice Extract (RED YEAST RICE PO) Take 2 tablets by mouth daily.     vitamin B-12 (CYANOCOBALAMIN) 1000 MCG tablet Take 1,000 mcg by mouth daily.     amiodarone (PACERONE) 200 MG tablet Take 200 mg by mouth in the morning and at bedtime.     amiodarone (PACERONE) 400 MG tablet Take 1 tablet (400 mg total) by mouth See admin instructions. Take 400 mg BiD for 7 days, then 400 mg daily after than until you see Dr. Domenic Polite (Cardiology) 90 tablet 1   furosemide (LASIX) 20 MG tablet Take 1 tablet (20 mg total) by mouth See admin instructions. Take Lasix 40 mg in the morning and 20 mg in the evening 45 tablet 4   losartan (COZAAR) 25 MG tablet Take 1 tablet (25 mg total) by mouth daily. 30 tablet 3   No facility-administered medications prior to visit.      Allergies:   Prednisone and Statins   Social History   Socioeconomic History   Marital status: Married    Spouse name: Not on file   Number of children: Not on file   Years of education: Not on file   Highest education level: Not on file  Occupational History   Not on file  Tobacco Use   Smoking status: Former    Packs/day: 0.02    Years: 25.00    Total pack years: 0.50    Types: Cigarettes    Quit date: 12/26/2006    Years since quitting: 15.5   Smokeless tobacco: Never  Vaping Use   Vaping Use: Never used  Substance and Sexual Activity  Alcohol use: Not Currently   Drug use: Not Currently    Types: Marijuana   Sexual activity: Yes  Other Topics Concern   Not on file  Social History Narrative   Married 44 years.Owns 2 farms-tobacco .Retired age 68 yrs.   Social Determinants of Health   Financial Resource Strain: Not on file  Food Insecurity: Not on file  Transportation Needs: Not on file  Physical Activity: Not on file  Stress: Not on file  Social Connections: Not on file     Family History:  The patient's family history includes COPD in his father; Dementia in his mother; Diabetes in his father; Heart attack in his father; Hyperlipidemia in his father; Hypertension in his father.   Review of Systems:    Please see the history of present illness.     All other systems reviewed and are otherwise negative except as noted above.   Physical Exam:    VS:  BP 122/76   Pulse 64   Ht '5\' 7"'$  (1.702 m)   Wt 199 lb 9.6 oz (90.5 kg)   SpO2 96%   BMI 31.26 kg/m    General: Well developed, well nourished,male appearing in no acute distress. Head: Normocephalic, atraumatic. Neck: No carotid bruits. JVD not elevated.  Lungs: Respirations regular and unlabored, without wheezes or rales.  Heart: Regular rate and rhythm. No S3 or S4.  No murmur, no rubs, or gallops appreciated. Abdomen: Appears non-distended. No obvious abdominal masses. Msk:  Strength and tone  appear normal for age. No obvious joint deformities or effusions. Extremities: No clubbing or cyanosis. No pitting edema.  Distal pedal pulses are 2+ bilaterally. Neuro: Alert and oriented X 3. Moves all extremities spontaneously. No focal deficits noted. Psych:  Responds to questions appropriately with a normal affect. Skin: No rashes or lesions noted  Wt Readings from Last 3 Encounters:  07/19/22 199 lb 9.6 oz (90.5 kg)  07/07/22 190 lb (86.2 kg)  06/25/22 186 lb 15.2 oz (84.8 kg)     Studies/Labs Reviewed:   EKG:  EKG is ordered today. The ekg ordered today demonstrates sinus bradycardia, heart rate 54 with first-degree AV block.  QTc improved when compared to prior tracings.  Recent Labs: 05/24/2022: TSH 1.230 06/23/2022: ALT 25; B Natriuretic Peptide 293.0 06/24/2022: Hemoglobin 17.3; Magnesium 2.1; Platelets 170 06/25/2022: BUN 34; Creatinine, Ser 1.04; Potassium 3.6; Sodium 137   Lipid Panel    Component Value Date/Time   CHOL 242 (H) 05/24/2022 0804   TRIG 127 05/24/2022 0804   HDL 48 05/24/2022 0804   CHOLHDL 5.0 05/24/2022 0804   CHOLHDL 5.6 (H) 02/16/2021 0940   VLDL 21 12/30/2019 0002   LDLCALC 171 (H) 05/24/2022 0804   LDLCALC 162 (H) 02/16/2021 0940    Additional studies/ records that were reviewed today include:   LHC: 05/2022  Ost RCA to Prox RCA lesion is 50% stenosed.   Mid Cx lesion is 99% stenosed.   Prox Cx lesion is 70% stenosed.   1st Diag lesion is 80% stenosed.   Dist LAD lesion is 90% stenosed.   A drug-eluting stent was successfully placed using a STENT ONYX FRONTIER 2.25X18.   A drug-eluting stent was successfully placed using a STENT ONYX FRONTIER 3.0X12.   A drug-eluting stent was successfully placed using a SYNERGY XD 2.25X16.   A drug-eluting stent was successfully placed using a SYNERGY XD 2.25X12.   Post intervention, there is a 0% residual stenosis.   Post intervention, there is a 0%  residual stenosis.   Post intervention, there is a 0%  residual stenosis.   Post intervention, there is a 0% residual stenosis.   Severe mid to distal LAD stenosis. Successful PTCA/DES x 1 mid to distal LAD Severe Diagonal 1 stenosis. Successful PTCA/DES x 1 Diagonal Severe distal Circumflex stenosis. Successful PTCA/DES x 2 distal Circumflex Moderate ostial RCA stenosis. Large dominant RCA   Recommendations: Continue DAPT with ASA and Brilinta for one year. Continue statin and beta blocker.    Cardiac Telemetry: 05/2022 ZIO AT reviewed.  11 days, 2 hours analyzed.   1.  Predominant rhythm is sinus with prolonged PR interval, heart rate ranging from 36 bpm up to 122 bpm with average heart rate 71 bpm. 2.  There were occasional PACs representing 2.7% total beats with otherwise rare atrial couplets and triplets representing less than 1% total beats. 3.  There were frequent PVCs representing 17.1% total beats with occasional ventricular couplets representing 4.8% total beats, and otherwise rare ventricular triplets representing less than 1% total beats.  Ventricular bigeminy and trigeminy were also noted. 4.  Multiple episodes of NSVT were noted.  Longest episode was described at 22 seconds, however looks to be more of a combination of fusion beats with more brief episodes of NSVT, rather than a sustained event. 5.  There were 10 episodes of PSVT, the longest of which was 16 beats. 6.  Patient triggered episodes loosely correlated with PVCs.  Limited Echo: 06/23/2022 IMPRESSIONS     1. Limited echo for LVEF   2. Left ventricular ejection fraction, by estimation, is 45 to 50%. Left  ventricular ejection fraction by 2D MOD biplane is 47.4 %. The left  ventricle has mildly decreased function. The left ventricle demonstrates  global hypokinesis.   3. The inferior vena cava is normal in size with greater than 50%  respiratory variability, suggesting right atrial pressure of 3 mmHg.   Comparison(s): Changes from prior study are noted. 05/26/2022:  LVEF 50-55%.   Assessment:    1. Coronary artery disease involving native coronary artery of native heart with other form of angina pectoris ([place])   2. Ischemic cardiomyopathy   3. Essential hypertension   4. Hyperlipidemia LDL goal <70   5. PVC (premature ventricular contraction)      Plan:   In order of problems listed above:  1. CAD - He is s/p cath on 05/26/2022 showing severe 3-vessel CAD and underwent DESx1 to mid/distal LAD, DESx1 to D1 and DESx2 to distal LCx. He still has some fatigue but symptoms have overall improved with suppression of his PVC's. No specific symptoms resembling his prior angina. - Continue Plavix 75 mg daily (previously intolerant to Brilinta secondary to dyspnea), ASA 81 mg daily, Zetia 10 mg daily and Losartan.  2. HFmrEF - His EF was at 45-50% by most recent limited echocardiogram and felt to be secondary to his PVC's. He remains on Losartan 25 mg daily and I did not switch this to [facility] or add Spironolactone today as he reports his SBP is in the 90's at times and checked at home. Not on a beta-blocker due to baseline bradycardia. Would ultimately recheck a limited echocardiogram in several months for reassessment of his EF. If this remains reduced, would add an SGLT2 inhibitor. He has been without his Lasix '60mg'$  for several days and is concerned the higher dose may be contributing to dehydration. Recommended resuming at 40 mg daily and rechecking a BMET in 1 week.   3. HTN -  His blood pressure is well-controlled at 122/76 during today's visit but he has been hypotensive at times per his report. I encouraged him to keep a BP log and report back if BP remains soft. Continue Losartan 25 mg daily.  4. HLD - LDL was elevated to 171 in 04/2022 and he was started on Zetia 10 mg daily given prior intolerances to multiple statins. Also takes Red Yeast Rice. Will recheck an FLP next week. If LDL is not at goal, would recommend referral for consideration of PCSK9  inhibitor therapy. This has been reviewed with the patient.  5. PVC's - He had a 17% burden by recent monitor and was intolerant to beta-blocker therapy given baseline bradycardia. Was started on Amiodarone '400mg'$  BID for 1 week followed by '400mg'$  daily. He has remained on the higher dose of 400 mg daily given his PVC's and NSVT by recent monitor. Amiodarone is not ideal in setting of his age and will refer to EP for consideration of other antiarrhythmics or possible PVC ablation.   Medication Adjustments/Labs and Tests Ordered: Current medicines are reviewed at length with the patient today.  Concerns regarding medicines are outlined above.  Medication changes, Labs and Tests ordered today are listed in the Patient Instructions below. Patient Instructions  Medication Instructions:   Take Lasix 40 mg Daily   *If you need a refill on your cardiac medications before your next appointment, please call your pharmacy*   Lab Work: Your physician recommends that you return for lab work in: Fasting ( Lipid, CMET)   If you have labs (blood work) drawn today and your tests are completely normal, you will receive your results only by: [place] (if you have MyChart) OR A paper copy in the mail If you have any lab test that is abnormal or we need to change your treatment, we will call you to review the results.   Testing/Procedures: NONE    Follow-Up: At [facility], you and your health needs are our priority.  As part of our continuing mission to provide you with exceptional heart care, we have created designated Provider Care Teams.  These Care Teams include your primary Cardiologist (physician) and Advanced Practice Providers (APPs -  Physician Assistants and Nurse Practitioners) who all work together to provide you with the care you need, when you need it.  We recommend signing up for the patient portal called "MyChart".  Sign up information is provided on this After Visit Summary.   MyChart is used to connect with patients for Virtual Visits (Telemedicine).  Patients are able to view lab/test results, encounter notes, upcoming appointments, etc.  Non-urgent messages can be sent to your provider as well.   To learn more about what you can do with MyChart, go to [email or web address].    Your next appointment:   3 month(s)  The format for your next appointment:   In Person  Provider:   Rozann Lesches, MD    Other Instructions Thank you for choosing [facility]!    Important Information About Sugar         Signed, Erma Heritage, PA-C  07/19/2022 4:17 PM    [facility] [address] [place], [place] [address] Phone: [phone] Fax: [phone]

## 2022-07-27 ENCOUNTER — Other Ambulatory Visit (HOSPITAL_COMMUNITY)
Admission: RE | Admit: 2022-07-27 | Discharge: 2022-07-27 | Disposition: A | Discharge status: Home / Self Care | Payer: 59 | Source: Ambulatory Visit | Attending: Student | Admitting: Student

## 2022-07-27 ENCOUNTER — Telehealth: Payer: Self-pay | Admitting: *Deleted

## 2022-07-27 DIAGNOSIS — E785 Hyperlipidemia, unspecified: Secondary | ICD-10-CM | POA: Diagnosis not present

## 2022-07-27 DIAGNOSIS — I10 Essential (primary) hypertension: Secondary | ICD-10-CM | POA: Diagnosis not present

## 2022-07-27 DIAGNOSIS — E782 Mixed hyperlipidemia: Secondary | ICD-10-CM

## 2022-07-27 LAB — COMPREHENSIVE METABOLIC PANEL
ALT: 25 U/L (ref 0–44)
AST: 19 U/L (ref 15–41)
Albumin: 4.1 g/dL (ref 3.5–5.0)
Alkaline Phosphatase: 47 U/L (ref 38–126)
Anion gap: 7 (ref 5–15)
BUN: 31 mg/dL — ABNORMAL HIGH (ref 8–23)
CO2: 28 mmol/L (ref 22–32)
Calcium: 9.1 mg/dL (ref 8.9–10.3)
Chloride: 107 mmol/L (ref 98–111)
Creatinine, Ser: 1.12 mg/dL (ref 0.61–1.24)
GFR, Estimated: >60 mL/min (ref >60)
Glucose, Bld: 100 mg/dL — ABNORMAL HIGH (ref 70–99)
Potassium: 3.9 mmol/L (ref 3.5–5.1)
Sodium: 142 mmol/L (ref 135–145)
Total Bilirubin: 0.9 mg/dL (ref 0.3–1.2)
Total Protein: 7 g/dL (ref 6.5–8.1)

## 2022-07-27 LAB — LIPID PANEL
Cholesterol: 203 mg/dL — ABNORMAL HIGH (ref 0–200)
HDL: 49 mg/dL
LDL Cholesterol: 118 mg/dL — ABNORMAL HIGH (ref 0–99)
Total CHOL/HDL Ratio: 4.1 ratio
Triglycerides: 181 mg/dL — ABNORMAL HIGH
VLDL: 36 mg/dL (ref 0–40)

## 2022-07-27 NOTE — Telephone Encounter (Signed)
-----   Message from Erma Heritage, [place] sent at 07/27/2022  9:57 AM EDT ----- Please let the patient know his cholesterol has improved but still remains above goal. His total cholesterol was at 242 in 04/2022 and is down to 203. His LDL was previously at 171 and is down to 118. Given his coronary disease, we prefer for this to be less than 55 to prevent recurrent blockages. If he is in agreement, I would recommend referral to the [facility] for consideration of PCSK9 inhibitor therapy given his statin intolerance.   His electrolytes are within a normal range and his kidney function and liver function are within a normal range as well. Continue Lasix at current dosing.

## 2022-07-27 NOTE — Telephone Encounter (Signed)
Pt notified and order placed 

## 2022-08-01 ENCOUNTER — Ambulatory Visit: Payer: 59 | Admitting: Cardiology

## 2022-08-01 DIAGNOSIS — Z8739 Personal history of other diseases of the musculoskeletal system and connective tissue: Secondary | ICD-10-CM | POA: Diagnosis not present

## 2022-08-01 DIAGNOSIS — E7801 Familial hypercholesterolemia: Secondary | ICD-10-CM | POA: Diagnosis not present

## 2022-08-01 DIAGNOSIS — R030 Elevated blood-pressure reading, without diagnosis of hypertension: Secondary | ICD-10-CM | POA: Diagnosis not present

## 2022-08-01 DIAGNOSIS — K219 Gastro-esophageal reflux disease without esophagitis: Secondary | ICD-10-CM | POA: Diagnosis not present

## 2022-08-01 DIAGNOSIS — Z8673 Personal history of transient ischemic attack (TIA), and cerebral infarction without residual deficits: Secondary | ICD-10-CM | POA: Diagnosis not present

## 2022-08-01 DIAGNOSIS — T466X5A Adverse effect of antihyperlipidemic and antiarteriosclerotic drugs, initial encounter: Secondary | ICD-10-CM | POA: Diagnosis not present

## 2022-08-01 DIAGNOSIS — Z6831 Body mass index (BMI) 31.0-31.9, adult: Secondary | ICD-10-CM | POA: Diagnosis not present

## 2022-08-01 DIAGNOSIS — I2510 Atherosclerotic heart disease of native coronary artery without angina pectoris: Secondary | ICD-10-CM | POA: Diagnosis not present

## 2022-08-01 DIAGNOSIS — G720 Drug-induced myopathy: Secondary | ICD-10-CM | POA: Diagnosis not present

## 2022-08-02 IMAGING — CT CT ABD-PELV W/ CM
2 of 5 series · 15 of 46 positions shown, 17 images · IV contrast (Omnipaque or Isovue)
Comparison: 09/05/2015 CT renal stone and prior.
COMPARISON: 09/05/2015 CT renal stone and prior.

Addendum:
CLINICAL DATA: Left lower quadrant abdominal pain

EXAM:
CT ABDOMEN AND PELVIS WITH CONTRAST
TECHNIQUE: Multidetector CT imaging of the abdomen and pelvis was performed
using the standard protocol following bolus administration of
intravenous contrast.
CONTRAST:  100mL OMNIPAQUE IOHEXOL 350 MG/ML SOLN

[Series 2: axial st · axial · 0.88mm/px · z∈[-438,-8]mm · 12 of 98 slices shown, 14 images]
[im 6/98  soft-tissue]
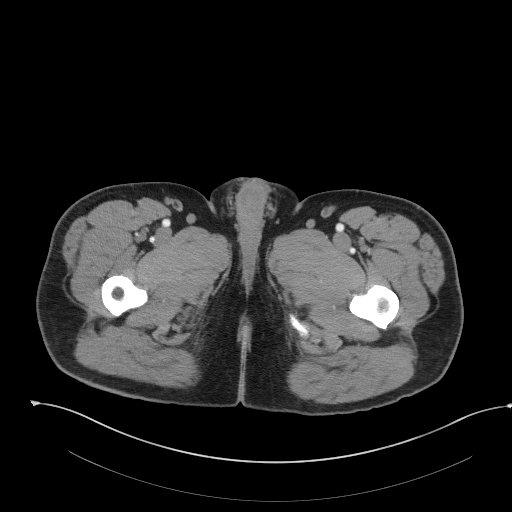
[im 6/98  bone]
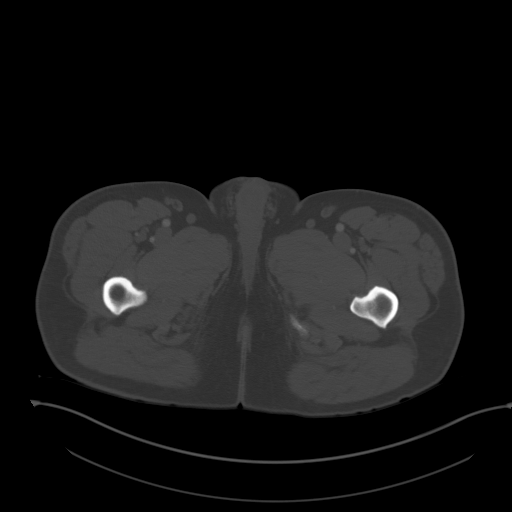
[im 16/98  soft-tissue]
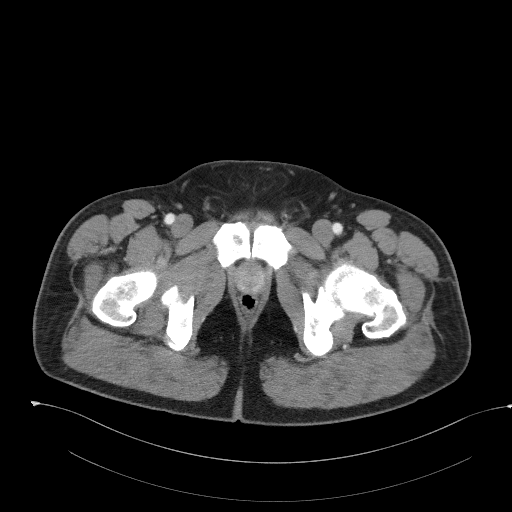
[im 21/98  soft-tissue]
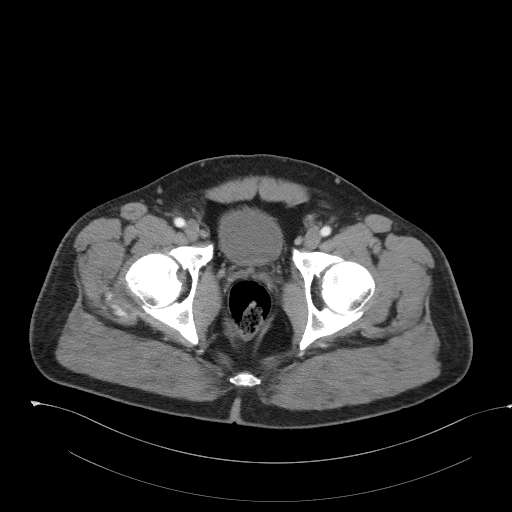
[im 31/98  soft-tissue]
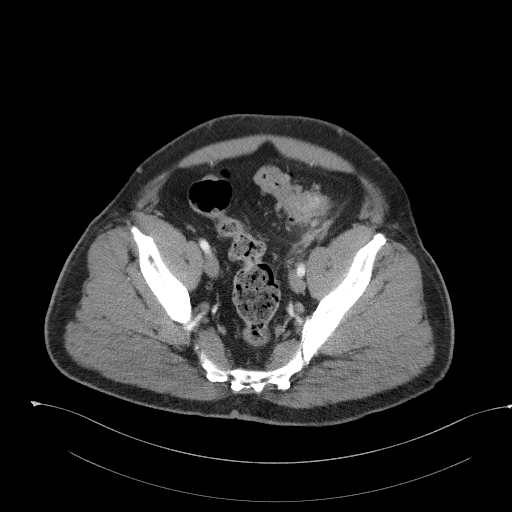
[im 36/98  soft-tissue]
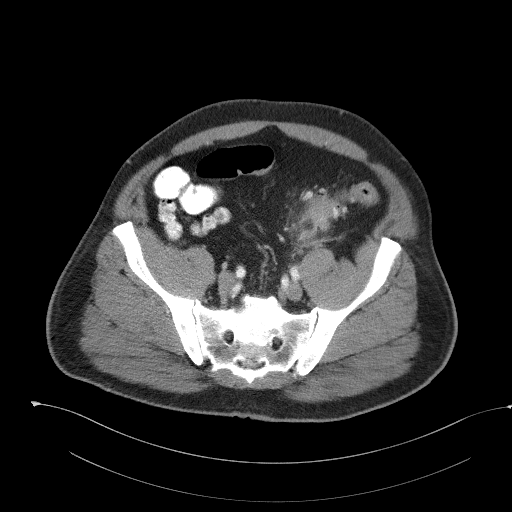
[im 46/98  soft-tissue]
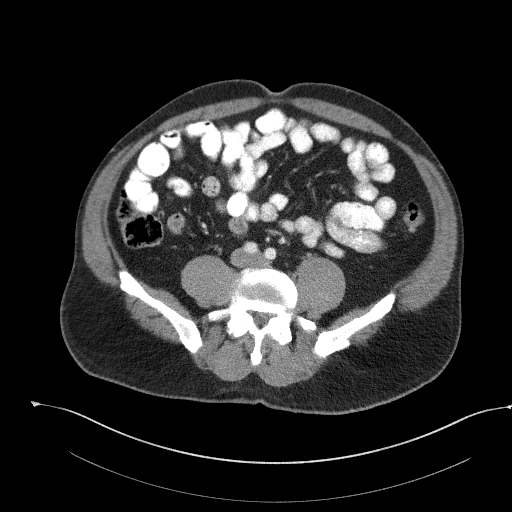
[im 52/98  soft-tissue]
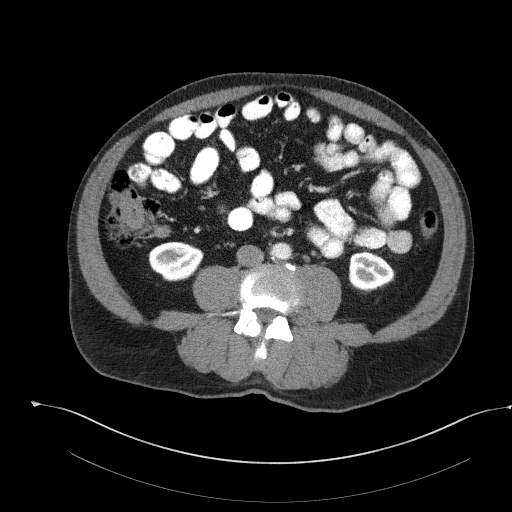
[im 62/98  soft-tissue]
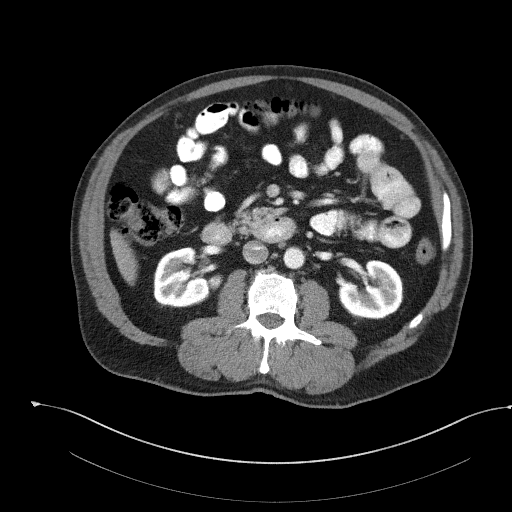
[im 67/98  soft-tissue]
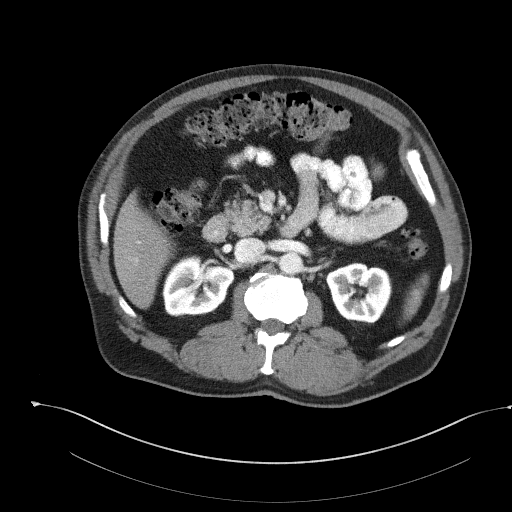
[im 67/98  bone]
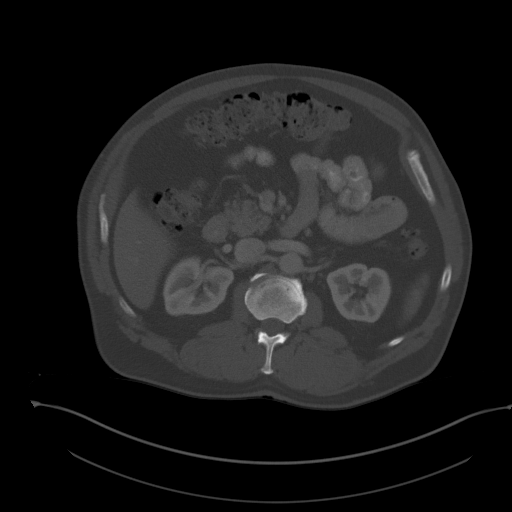
[im 77/98  soft-tissue]
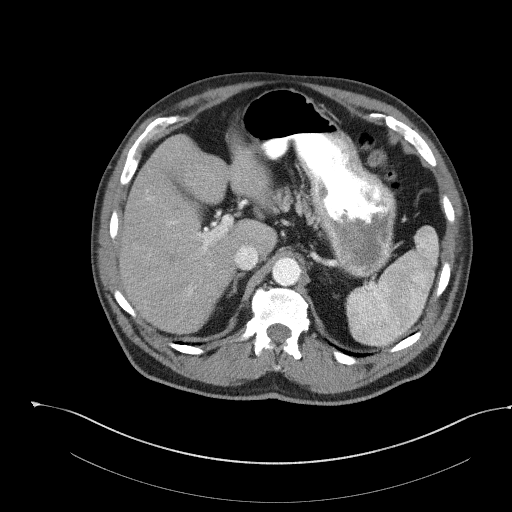
[im 82/98  soft-tissue]
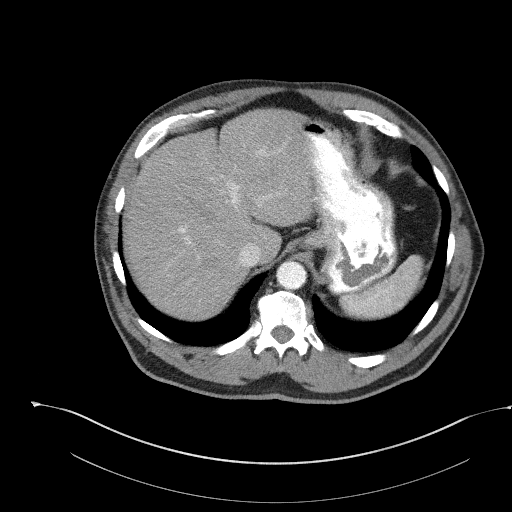
[im 92/98  soft-tissue]
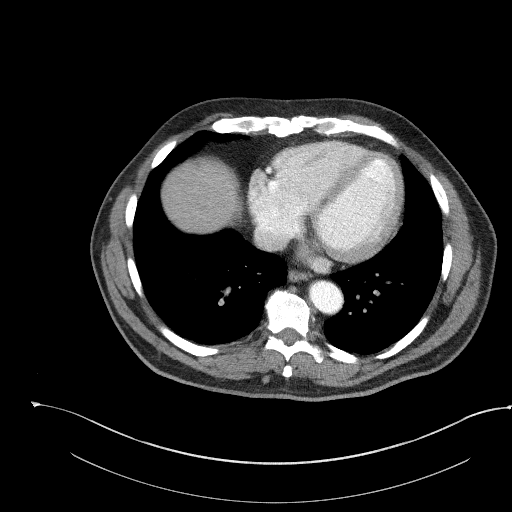

[Series 5: coronal st · coronal · 0.85mm/px · 3 of 117 slices shown]
[im 39/117  soft-tissue]
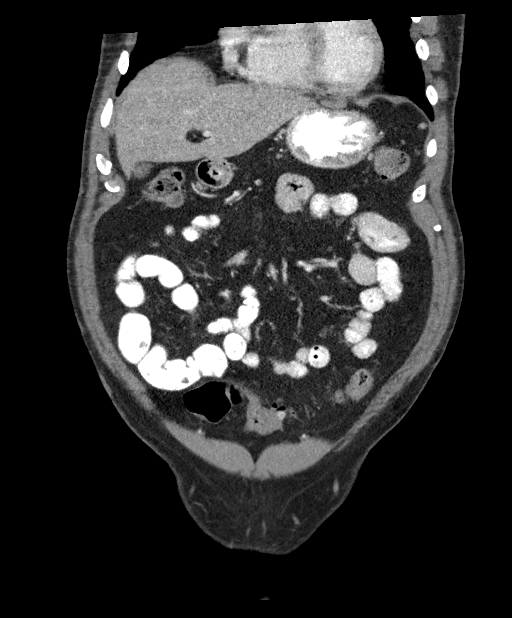
[im 52/117  soft-tissue]
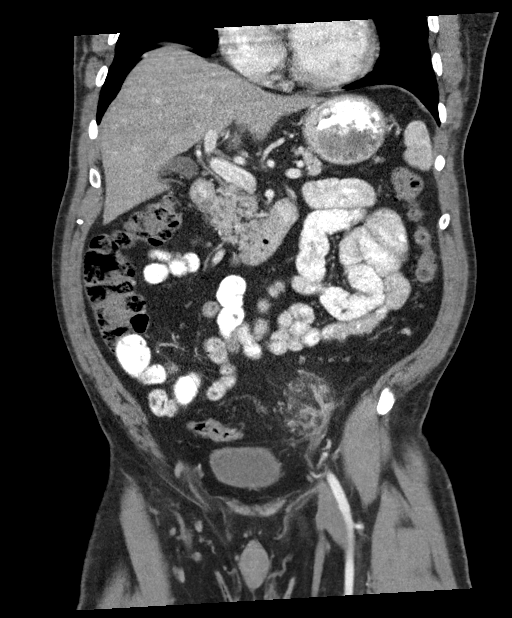
[im 65/117  soft-tissue]
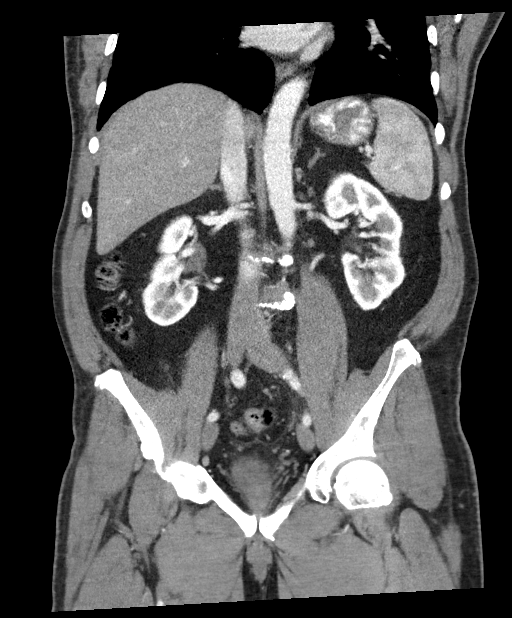

[15 of 46 positions shown; findings below may reference images not displayed]

FINDINGS: Lower chest: Dependent atelectasis.

Hepatobiliary: Small right hepatic dome hypodensity is unchanged
since 9224 and may reflect focal fat deposition. No biliary
dilatation. Gallbladder is unremarkable.

Pancreas: No focal lesions or pancreatic ductal dilatation. No
surrounding inflammation.

Spleen: Unremarkable.

Adrenals/Urinary Tract: Adrenal glands are unremarkable. No focal
renal lesion or calculi. No hydronephrosis. Bladder is unremarkable.

Stomach/Bowel: Stomach is within normal limits. Appendix appears
normal. No evidence of obstruction.

Colonic diverticulosis most prominent in the sigmoid. There is
short-segment hyperemia, wall thickening and pericolonic stranding
involving the proximal sigmoid. Along the dorsal aspect of the
proximal sigmoid there is a peripherally enhancing 2.3 x 1.3 x
cm fluid collection ([DATE], [DATE]). No ascites.

Vascular/Lymphatic: Vasculature is within normal limits for
patient's age. Mild atheromatous disease involving the abdominal
aorta and its branch vessels. Prominent left external iliac nodes
are reactive.

Reproductive: Unremarkable.

Other: Small fat containing bilateral inguinal hernias.

Musculoskeletal: No acute or significant osseous findings.
Multilevel spondylosis.
IMPRESSION: Proximal sigmoid diverticulitis with 2.3 cm abscess along the dorsal
sigmoid.

Prominent left external iliac nodes are reactive.

ADDENDUM:
These results were called by telephone at the time of interpretation
on 09/04/2020 at [DATE] to provider Dr. Darline, Who verbally
acknowledged these results.

*** End of Addendum ***
FINDINGS: Lower chest: Dependent atelectasis.

Hepatobiliary: Small right hepatic dome hypodensity is unchanged
since 9224 and may reflect focal fat deposition. No biliary
dilatation. Gallbladder is unremarkable.

Pancreas: No focal lesions or pancreatic ductal dilatation. No
surrounding inflammation.

Spleen: Unremarkable.

Adrenals/Urinary Tract: Adrenal glands are unremarkable. No focal
renal lesion or calculi. No hydronephrosis. Bladder is unremarkable.

Stomach/Bowel: Stomach is within normal limits. Appendix appears
normal. No evidence of obstruction.

Colonic diverticulosis most prominent in the sigmoid. There is
short-segment hyperemia, wall thickening and pericolonic stranding
involving the proximal sigmoid. Along the dorsal aspect of the
proximal sigmoid there is a peripherally enhancing 2.3 x 1.3 x
cm fluid collection ([DATE], [DATE]). No ascites.

Vascular/Lymphatic: Vasculature is within normal limits for
patient's age. Mild atheromatous disease involving the abdominal
aorta and its branch vessels. Prominent left external iliac nodes
are reactive.

Reproductive: Unremarkable.

Other: Small fat containing bilateral inguinal hernias.

Musculoskeletal: No acute or significant osseous findings.
Multilevel spondylosis.
IMPRESSION: Proximal sigmoid diverticulitis with 2.3 cm abscess along the dorsal
sigmoid.

Prominent left external iliac nodes are reactive.

## 2022-08-08 ENCOUNTER — Ambulatory Visit (INDEPENDENT_AMBULATORY_CARE_PROVIDER_SITE_OTHER): Payer: 59

## 2022-08-08 ENCOUNTER — Encounter: Payer: Self-pay | Admitting: Emergency Medicine

## 2022-08-08 ENCOUNTER — Ambulatory Visit
Admission: EM | Admit: 2022-08-08 | Discharge: 2022-08-08 | Disposition: A | Discharge status: Home / Self Care | Payer: 59 | Attending: Nurse Practitioner | Admitting: Nurse Practitioner

## 2022-08-08 DIAGNOSIS — M79644 Pain in right finger(s): Secondary | ICD-10-CM | POA: Diagnosis not present

## 2022-08-08 DIAGNOSIS — S6991XA Unspecified injury of right wrist, hand and finger(s), initial encounter: Secondary | ICD-10-CM | POA: Diagnosis not present

## 2022-08-08 NOTE — Discharge Instructions (Addendum)
-   Finger x-ray today is negative for fracture; the hematoma and swelling is likely the cause of the pain - Please ice your finger; you can wear a finger splint for comfort as needed.  - Take Tylenol as needed for pain - Follow up with PCP if symptoms do not improve despite this treatment - Last Tdap was in 2022, we do not need to update this today

## 2022-08-08 NOTE — ED Triage Notes (Addendum)
Pt presents with right thumb injury after crushing in a floor jack on Friday. States pain is worse with movement.

## 2022-08-08 NOTE — ED Provider Notes (Signed)
[facility]    CSN: [id number] Arrival date & time: 08/08/22  0943      History   Chief Complaint Chief Complaint  Patient presents with   Finger Injury    HPI Jeffrey Allen is a 66 y.o. male.   Patient presents with right thumb pain that began Friday.  Reports while at work, a Freight forwarder handle fell on his thumb and his thumb was caught in between the handle and a sharp piece of metal.  Reports pain to the right thumb since then.  He also had some significant bleeding that is not controlled.  Reports it is now difficult and painful to move his right thumb.  Reports the pain is currently third of 10, does not radiate to his fingertip.  Denies weakness, numbness, redness, fevers, nausea/vomiting.  He does endorse swelling and bruising to the thumb.  He has been soaking his hand in a bucket of ice that has seemed to help.  Last Tdap was in 2022.      Past Medical History:  Diagnosis Date   CAD (coronary artery disease)    a. 05/2022: DES placement x1 to the mid/distal LAD, DES placement x1 to D1 and DES placement x2 to the distal LCx   GERD (gastroesophageal reflux disease)    Gout    High cholesterol    Obesity (BMI 30.0-34.9)    Peptic ulcer disease    Prediabetes    Stroke ([place]) 06/2017    Patient Active Problem List   Diagnosis Date Noted   CAD S/P percutaneous coronary angioplasty/Multi-Vessel CAD/Stents 05/26/22 06/26/2022   Acute exacerbation of CHF (congestive heart failure) ([place]) 06/23/2022   PVC (premature ventricular contraction)/PACs 05/27/2022   Unstable angina ([place])    Diverticulitis of colon 10/07/2020   History of colonic polyps 10/07/2020   Vitamin D deficiency 02/10/2020   Chest pain 12/30/2019   COVID-19 virus infection 12/30/2019   NSTEMI (non-ST elevated myocardial infarction) ([place]) 12/29/2019   Prediabetes 11/07/2019   Hypogonadism, male 11/07/2019   Obesity (BMI 30.0-34.9) 11/07/2019   Stroke ([place])    Essential hypertension  06/19/2018   Mixed hyperlipidemia 06/19/2018   Gout 06/18/2018   GERD (gastroesophageal reflux disease) 06/18/2018   Acute CVA (cerebrovascular accident) ([place]) 06/18/2018   CARPAL TUNNEL SYNDROME 04/06/2009   CUBITAL TUNNEL SYNDROME 04/06/2009   NECK PAIN, CHRONIC 04/06/2009   Disturbance of skin sensation 04/06/2009   ANKLE SPRAIN 12/03/2008    Past Surgical History:  Procedure Laterality Date   BIOPSY  11/25/2020   Procedure: BIOPSY;  Surgeon: Daneil Dolin, MD;  [facility];  Service: Endoscopy;;  gastric   COLONOSCOPY     COLONOSCOPY N/A 12/07/2016   Scattered small and large-mouthed diverticula in sigmoid and descending colon. Six semi-pedunculated polyps in sigmoid and ascending colon, 4-6 mm in size. Distal TI normal appearing. Hyperplastic and tubular adenomas. 3 year surveillance recommended.    COLONOSCOPY N/A 11/25/2020   diverticulosis in sigmoid and descending colon, three 3-4 mm polyps at rectosigmoid colon and hepatic flexure. Hyperplastic polyp. 5 year colonoscopy.    CORONARY STENT INTERVENTION N/A 05/26/2022   Procedure: CORONARY STENT INTERVENTION;  Surgeon: Burnell Blanks, MD;  Location: [facility];  Service: Cardiovascular;  Laterality: N/A;   ESOPHAGOGASTRODUODENOSCOPY N/A 11/25/2020     normal esophagus, portal gastropathy, normal duodenum. Mild chronic gastritis, negative H.pylori.    LEFT HEART CATH AND CORS/GRAFTS ANGIOGRAPHY N/A 05/26/2022   Procedure: LEFT HEART CATH AND CORS/GRAFTS ANGIOGRAPHY;  Surgeon: Burnell Blanks, MD;  Location: [facility];  Service: Cardiovascular;  Laterality: N/A;   POLYPECTOMY  11/25/2020   Procedure: POLYPECTOMY;  Surgeon: Daneil Dolin, MD;  [facility];  Service: Endoscopy;;   TOOTH EXTRACTION         Home Medications    Prior to Admission medications   Medication Sig Start Date End Date Taking? Authorizing Provider  allopurinol (ZYLOPRIM) 300 MG tablet Take  0.5 tablets (150 mg total) by mouth daily. 05/18/21   Doree Albee, MD  amiodarone (PACERONE) 200 MG tablet Take 1 tablet (200 mg total) by mouth in the morning and at bedtime. 07/19/22   Strader, Fransisco Hertz, PA-C  Ascorbic Acid (VITAMIN C PO) Take 1 tablet by mouth daily.    [provider]  aspirin EC 81 MG tablet Take 1 tablet (81 mg total) by mouth daily with breakfast. 06/25/22   Denton Brick, Courage, MD  Cholecalciferol (VITAMIN D3) 125 MCG (5000 UT) TABS Take 10,000 Units by mouth daily.     [provider]  clopidogrel (PLAVIX) 75 MG tablet Take 1 tablet (75 mg total) by mouth daily. 06/25/22   Roxan Hockey, MD  ezetimibe (ZETIA) 10 MG tablet TAKE (1) TABLET BY MOUTH ONCE DAILY. 07/18/22   [name], MD  furosemide (LASIX) 40 MG tablet Take 1 tablet (40 mg total) by mouth daily. 07/19/22 07/19/23  Strader, Fransisco Hertz, PA-C  gabapentin (NEURONTIN) 100 MG capsule TAKE 1 CAPSULE BY MOUTH THREE TIMES A DAY. Patient taking differently: Take 100 mg by mouth daily. 09/22/21   Lindell Spar, MD  Garlic 2637 MG CAPS Take 1,000 mg by mouth daily.    [provider]  losartan (COZAAR) 25 MG tablet Take 1 tablet (25 mg total) by mouth daily. 07/19/22   Strader, Fransisco Hertz, PA-C  nitroGLYCERIN (NITROSTAT) 0.4 MG SL tablet Place 1 tablet (0.4 mg total) under the tongue every 5 (five) minutes as needed for chest pain. 05/27/22   Cheryln Manly, NP  pantoprazole (PROTONIX) 40 MG tablet Take 1 tablet (40 mg total) by mouth daily. 07/14/21   Doree Albee, MD  Probiotic Product (PROBIOTIC-10 PO) Take 1 tablet by mouth daily. 18 thousand    [provider]  Red Yeast Rice Extract (RED YEAST RICE PO) Take 2 tablets by mouth daily.    [provider]  vitamin B-12 (CYANOCOBALAMIN) 1000 MCG tablet Take 1,000 mcg by mouth daily.    [provider]    Family History Family History  Problem Relation Age of Onset   Dementia Mother    Hypertension  Father    Diabetes Father    COPD Father    Hyperlipidemia Father    Heart attack Father    Colon cancer Neg Hx     Social History Social History   Tobacco Use   Smoking status: Former    Packs/day: 0.02    Years: 25.00    Total pack years: 0.50    Types: Cigarettes    Quit date: 12/26/2006    Years since quitting: 15.6   Smokeless tobacco: Never  Vaping Use   Vaping Use: Never used  Substance Use Topics   Alcohol use: Not Currently   Drug use: Not Currently    Types: Marijuana     Allergies   Prednisone and Statins   Review of Systems Review of Systems Per HPI  Physical Exam Triage Vital Signs ED Triage Vitals  Enc  Vitals Group     BP 08/08/22 1004 121/72     Pulse Rate 08/08/22 1004 (!) 50     Resp 08/08/22 1004 16     Temp 08/08/22 1004 98 F (36.7 C)     Temp Source 08/08/22 1004 Oral     SpO2 08/08/22 1004 97 %     Weight --      Height --      Head Circumference --      Peak Flow --      Pain Score 08/08/22 1005 3     Pain Loc --      Pain Edu? --      Excl. in [place]? --    No data found.  Updated Vital Signs BP 121/72 (BP Location: Left Arm)   Pulse (!) 50   Temp 98 F (36.7 C) (Oral)   Resp 16   SpO2 97%   Visual Acuity Right Eye Distance:   Left Eye Distance:   Bilateral Distance:    Right Eye Near:   Left Eye Near:    Bilateral Near:     Physical Exam Vitals and nursing note reviewed.  Constitutional:      General: He is not in acute distress.    Appearance: Normal appearance. He is not toxic-appearing.  HENT:     Mouth/Throat:     Mouth: Mucous membranes are moist.     Pharynx: Oropharynx is clear.  Pulmonary:     Effort: Pulmonary effort is normal. No respiratory distress.  Musculoskeletal:     Right hand: Swelling and bony tenderness present. No deformity. Decreased range of motion. Normal sensation. Normal capillary refill. Normal pulse.     Left hand: Normal.       Hands:     Comments: Inspection: Slight swelling  and bruising to the proximal thumb in approximately area marked; no obvious deformity or redness.  There is an abrasion to the anterior thumb in approximately area marked.  No active bleeding, fluctuance, drainage, warmth, oozing. Palpation: Tender to palpation at the MCP; no obvious deformities palpated ROM: Full active ROM right thumb; passive range of motion decreased Strength: 5/5 right hand Neurovascular: neurovascularly intact in left and right upper extremity   Skin:    General: Skin is warm and dry.     Capillary Refill: Capillary refill takes less than 2 seconds.     Coloration: Skin is not jaundiced or pale.     Findings: Bruising present. No erythema.  Neurological:     Mental Status: He is alert and oriented to person, place, and time.  Psychiatric:        Behavior: Behavior is cooperative.      UC Treatments / Results  Labs (all labs ordered are listed, but only abnormal results are displayed) Labs Reviewed - No data to display  EKG   Radiology DG Finger Thumb Right  Result Date: 08/08/2022 CLINICAL DATA:  Right thumb pain after injury 3 days ago. EXAM: RIGHT THUMB 2+V COMPARISON:  None Available. FINDINGS: There is no evidence of fracture or dislocation. There is no evidence of arthropathy or other focal bone abnormality. Soft tissues are unremarkable. IMPRESSION: Negative. Electronically Signed   By: Marijo Conception M.D.   On: 08/08/2022 10:29    Procedures Procedures (including critical care time)  Medications Ordered in UC Medications - No data to display  Initial Impression / Assessment and Plan / UC Course  I have reviewed the triage vital signs and the  nursing notes.  Pertinent labs & imaging results that were available during my care of the patient were reviewed by me and considered in my medical decision making (see chart for details).    Patient is a very pleasant, well-appearing 66 year old male presenting for right thumb pain today after crush  injury.  X-ray imaging of thumb is negative for acute bony fracture or bony abnormality.  Discussed with the patient.  Discussed recommendations including ice, finger splint as needed for protection/comfort.  We discussed that the swelling and bruising will likely take 1 to 2 weeks to improve and range of motion should gradually return as this improves.  Encouraged Tylenol as needed for pain.  Follow-up with primary care provider if symptoms persist or worsen despite treatment. Final Clinical Impressions(s) / UC Diagnoses   Final diagnoses:  Pain of right thumb     Discharge Instructions      - Finger x-ray today is negative for fracture; the hematoma and swelling is likely the cause of the pain - Please ice your finger; you can wear a finger splint for comfort as needed.  - Take Tylenol as needed for pain - Follow up with PCP if symptoms do not improve despite this treatment - Last Tdap was in 2022, we do not need to update this today     ED Prescriptions   None    PDMP not reviewed this encounter.   Eulogio Bear, NP 08/08/22 1120

## 2022-08-21 NOTE — Progress Notes (Unsigned)
Patient ID: Jeffrey Allen                 DOB: 06/28/1956                    MRN: [id number]     HPI: Jeffrey Allen is a 66 y.o. male patient of Dr. Domenic Polite referred to lipid clinic by Bernerd Pho, PA-C. PMH is significant for CVA (06/18/2018), CAD (s/p cath on 05/26/2022 showing severe 3-vessel CAD with DESx1 to mid/distal LAD, DESx1 to D1 and DESx2 to distal LCx), PVC's (17% burden by recent monitor), HTN, HLD, gout, prediabetes and prior CVA.   Need to get statin hx (looks like only ever tried atorva)  [employer]  Repatha vs nexlizet   Current Medications: ezetimibe 10 mg daily, red yeast rice 2 tablets daily  Intolerances: atorvastatin 40 mg daily, atorvastatin 80 mg daily Risk Factors: CAD, HTN, HLD, hx CVA LDL goal: at least <70, ideally <55   Diet:   Exercise:   Family History: The patient's family history includes COPD in his father; Dementia in his mother; Diabetes in his father; Heart attack in his father; Hyperlipidemia in his father; Hypertension in his father  Social History: former smoker, no alcohol   Labs: 07/27/22 -TC 203, TG 181, HDL 49, LDL 118 (ezetimibe 10 mg daily, red yeast rice)   Past Medical History:  Diagnosis Date   CAD (coronary artery disease)    a. 05/2022: DES placement x1 to the mid/distal LAD, DES placement x1 to D1 and DES placement x2 to the distal LCx   GERD (gastroesophageal reflux disease)    Gout    High cholesterol    Obesity (BMI 30.0-34.9)    Peptic ulcer disease    Prediabetes    Stroke ([place]) 06/2017    Current Outpatient Medications on File Prior to Visit  Medication Sig Dispense Refill   allopurinol (ZYLOPRIM) 300 MG tablet Take 0.5 tablets (150 mg total) by mouth daily. 30 tablet 1   amiodarone (PACERONE) 200 MG tablet Take 1 tablet (200 mg total) by mouth in the morning and at bedtime. 180 tablet 2   Ascorbic Acid (VITAMIN C PO) Take 1 tablet by mouth daily.     aspirin EC 81 MG tablet Take 1 tablet (81 mg total) by mouth  daily with breakfast. 30 tablet 12   Cholecalciferol (VITAMIN D3) 125 MCG (5000 UT) TABS Take 10,000 Units by mouth daily.      clopidogrel (PLAVIX) 75 MG tablet Take 1 tablet (75 mg total) by mouth daily. 30 tablet 4   ezetimibe (ZETIA) 10 MG tablet TAKE (1) TABLET BY MOUTH ONCE DAILY. 30 tablet 5   furosemide (LASIX) 40 MG tablet Take 1 tablet (40 mg total) by mouth daily. 90 tablet 3   gabapentin (NEURONTIN) 100 MG capsule TAKE 1 CAPSULE BY MOUTH THREE TIMES A DAY. (Patient taking differently: Take 100 mg by mouth daily.) 90 capsule 0   Garlic 7628 MG CAPS Take 1,000 mg by mouth daily.     losartan (COZAAR) 25 MG tablet Take 1 tablet (25 mg total) by mouth daily. 90 tablet 1   nitroGLYCERIN (NITROSTAT) 0.4 MG SL tablet Place 1 tablet (0.4 mg total) under the tongue every 5 (five) minutes as needed for chest pain. 25 tablet 2   pantoprazole (PROTONIX) 40 MG tablet Take 1 tablet (40 mg total) by mouth daily. 90 tablet 1   Probiotic Product (PROBIOTIC-10 PO) Take 1 tablet by mouth daily.  18 thousand     Red Yeast Rice Extract (RED YEAST RICE PO) Take 2 tablets by mouth daily.     vitamin B-12 (CYANOCOBALAMIN) 1000 MCG tablet Take 1,000 mcg by mouth daily.     No current facility-administered medications on file prior to visit.    Allergies  Allergen Reactions   Prednisone Other (See Comments)    Peptic ulcers     Statins     Assessment/Plan:  1. Hyperlipidemia -    Thank you,   Ramond Dial, Pharm.D, BCPS, CPP [place]  [address], [place], [place] [address]  Phone: [phone]; Fax: [phone]

## 2022-08-22 ENCOUNTER — Ambulatory Visit: Payer: 59 | Attending: Cardiovascular Disease | Admitting: Pharmacist

## 2022-08-22 DIAGNOSIS — E782 Mixed hyperlipidemia: Secondary | ICD-10-CM

## 2022-08-22 DIAGNOSIS — I639 Cerebral infarction, unspecified: Secondary | ICD-10-CM

## 2022-08-22 NOTE — Patient Instructions (Addendum)
Your LDL is 118. We would like it to be <55.  We will submit prior authorization for Repatha which is a twice monthly injection that would lower LDL-C by up to 60%   Continue taking ezetimibe 10 mg daily.  You can decide whether you would like to continue red yeast rice and CoQ10.  For insurance information, you can contact [facility]). Call, [phone].  Try to drink more water and limit soda and sweet tea.

## 2022-08-23 ENCOUNTER — Telehealth: Payer: Self-pay

## 2022-08-23 DIAGNOSIS — E782 Mixed hyperlipidemia: Secondary | ICD-10-CM

## 2022-08-23 MED ORDER — REPATHA SURECLICK 140 MG/ML ~~LOC~~ SOAJ: 140.0000 mg | SUBCUTANEOUS | 11 refills | Status: DC

## 2022-08-23 NOTE — Telephone Encounter (Signed)
Attempted to call pt and left voicemail. Repatha is approved through [employer]. Sent Rx to pt's pharmacy. Will call back to schedule follow up lipid labs in 2 months.

## 2022-08-25 NOTE — Telephone Encounter (Signed)
Called pt's pharmacy. PA actually required additional information. Submitted PA (Key [id number]).

## 2022-08-30 NOTE — Telephone Encounter (Signed)
I spoke with patients pharmacy who says they received notification today from pt pharmacy that PA was denied, but no reason why.  I will call pt insurance tomorrow to find out. Per pharmacist, pt was previously denied [place] and Praluent written by PCP. I wonder if its denying bc denial is already on file.

## 2022-08-31 ENCOUNTER — Ambulatory Visit: Payer: 59 | Admitting: "Endocrinology

## 2022-08-31 NOTE — Telephone Encounter (Signed)
Called and spoke with patients insurance [phone] Turns out his plan did not change to Repatha like most of the other [employer] plans. Will submit PA for Praluent. Key started [id number]. PA submitted.

## 2022-09-01 ENCOUNTER — Encounter: Payer: Self-pay | Admitting: Pharmacist

## 2022-09-01 NOTE — Telephone Encounter (Signed)
Praluent denied because patient has not had muscle symptoms to statins (SAMS index is not >7). Patient had other intolerances to statins. Will submit appeals. Left VM for pt to call back to discuss.

## 2022-09-06 NOTE — Telephone Encounter (Signed)
Appeals letter faxed 9/12. See my chart message

## 2022-09-12 ENCOUNTER — Telehealth: Payer: Self-pay | Admitting: Cardiology

## 2022-09-12 ENCOUNTER — Encounter: Payer: Self-pay | Admitting: Cardiology

## 2022-09-12 ENCOUNTER — Ambulatory Visit: Payer: 59 | Attending: Cardiology | Admitting: Cardiology

## 2022-09-12 VITALS — BP 126/88 | HR 53 | Ht 67.0 in | Wt 206.2 lb

## 2022-09-12 DIAGNOSIS — I493 Ventricular premature depolarization: Secondary | ICD-10-CM | POA: Diagnosis not present

## 2022-09-12 MED ORDER — AMIODARONE HCL 200 MG PO TABS: 200.0000 mg | ORAL_TABLET | Freq: Every day | ORAL | 3 refills | Status: DC

## 2022-09-12 MED ORDER — AMIODARONE HCL 200 MG PO TABS: 100.0000 mg | ORAL_TABLET | Freq: Every day | ORAL | 3 refills | Status: DC

## 2022-09-12 NOTE — Telephone Encounter (Signed)
Pt c/o medication issue:  1. Name of Medication: furosemide (LASIX) 40 MG tablet   2. How are you currently taking this medication (dosage and times per day)? Take 1 tablet (40 mg total) by mouth daily. - Oral  3. Are you having a reaction (difficulty breathing--STAT)? no  4. What is your medication issue? Pt wants to know if he still needs to take this medication since his recent EKG did not show any PVCs

## 2022-09-12 NOTE — Telephone Encounter (Signed)
Pt aware to follow up with general cardiologist office, Dr. Zigmund Gottron to discuss reduction vs stopping Lasix. Patient verbalized understanding and agreeable to plan.

## 2022-09-12 NOTE — Telephone Encounter (Signed)
Called patient back about his message. Patient stated he feels like if he is not having any more PVC's that he would like to come off his Lasix or at the least reduce the dose. Patient stated he saw Dr. Curt Bears earlier today and wanted to see what he thought. Will send to Dr. Curt Bears for advisement.

## 2022-09-12 NOTE — Progress Notes (Signed)
Electrophysiology Office Note   Date:  09/12/2022   ID:  Remy, Voiles 26-Nov-1956, MRN [id number]  PCP:  [place] Nation, MD  Cardiologist:  Domenic Polite Primary Electrophysiologist:  Chaske Paskett Meredith Leeds, MD    Chief Complaint: PVC   History of Present Illness: Jeffrey Allen is a 66 y.o. male who is being seen today for the evaluation of PVC at the request of Ahmed Prima Fransisco Hertz, [place]*. Presenting today for electrophysiology evaluation.  He has a history significant for coronary artery disease with three-vessel disease post DES to the LAD, D1, circumflex, PVCs, hypertension, hyperlipidemia, prediabetes, CVA.  He was hospitalized at [facility] 06/23/2022 with abdominal pain, dyspnea, dizziness.  His weight had increased by over 5 pounds.  He had shortness of breath but this was improved with switching from Brilinta to Plavix.  He reported bradycardia since being started on acebutolol for PVCs.  Acebutolol was discontinued and he was started on amiodarone.  Echo showed an ejection fraction of 45 to 50%.  He had an EKG 07/07/2022 that showed sinus bradycardia without PVCs.  Since being on amiodarone, he feels that he has been more short of breath and fatigue.  He has had no chest pain.  He was short of breath and fatigue prior to amiodarone, but this was attributed to coronary artery disease and PVCs.  He is quite happy that he is no longer having PVCs.  Today, he denies symptoms of palpitations, chest pain, orthopnea, PND, lower extremity edema, claudication, dizziness, presyncope, syncope, bleeding, or neurologic sequela. The patient is tolerating medications without difficulties.    Past Medical History:  Diagnosis Date   CAD (coronary artery disease)    a. 05/2022: DES placement x1 to the mid/distal LAD, DES placement x1 to D1 and DES placement x2 to the distal LCx   GERD (gastroesophageal reflux disease)    Gout    High cholesterol    Obesity (BMI 30.0-34.9)    Peptic ulcer  disease    Prediabetes    Stroke [facility]) 06/2017   Past Surgical History:  Procedure Laterality Date   BIOPSY  11/25/2020   Procedure: BIOPSY;  Surgeon: Daneil Dolin, MD;  [facility];  Service: Endoscopy;;  gastric   COLONOSCOPY     COLONOSCOPY N/A 12/07/2016   Scattered small and large-mouthed diverticula in sigmoid and descending colon. Six semi-pedunculated polyps in sigmoid and ascending colon, 4-6 mm in size. Distal TI normal appearing. Hyperplastic and tubular adenomas. 3 year surveillance recommended.    COLONOSCOPY N/A 11/25/2020   diverticulosis in sigmoid and descending colon, three 3-4 mm polyps at rectosigmoid colon and hepatic flexure. Hyperplastic polyp. 5 year colonoscopy.    CORONARY STENT INTERVENTION N/A 05/26/2022   Procedure: CORONARY STENT INTERVENTION;  Surgeon: Burnell Blanks, MD;  Location: [place] [facility];  Service: Cardiovascular;  Laterality: N/A;   ESOPHAGOGASTRODUODENOSCOPY N/A 11/25/2020     normal esophagus, portal gastropathy, normal duodenum. Mild chronic gastritis, negative H.pylori.    LEFT HEART CATH AND CORS/GRAFTS ANGIOGRAPHY N/A 05/26/2022   Procedure: LEFT HEART CATH AND CORS/GRAFTS ANGIOGRAPHY;  Surgeon: Burnell Blanks, MD;  Location: [place] [facility];  Service: Cardiovascular;  Laterality: N/A;   POLYPECTOMY  11/25/2020   Procedure: POLYPECTOMY;  Surgeon: Daneil Dolin, MD;  [facility];  Service: Endoscopy;;   TOOTH EXTRACTION       Current Outpatient Medications  Medication Sig Dispense Refill   allopurinol (ZYLOPRIM) 300 MG tablet Take 0.5  tablets (150 mg total) by mouth daily. 30 tablet 1   amiodarone (PACERONE) 200 MG tablet Take 1 tablet (200 mg total) by mouth in the morning and at bedtime. 180 tablet 2   Ascorbic Acid (VITAMIN C PO) Take 1 tablet by mouth daily.     aspirin EC 81 MG tablet Take 1 tablet (81 mg total) by mouth daily with breakfast. 30 tablet 12   Cholecalciferol (VITAMIN  D3) 125 MCG (5000 UT) TABS Take 10,000 Units by mouth daily.      clopidogrel (PLAVIX) 75 MG tablet Take 1 tablet (75 mg total) by mouth daily. 30 tablet 4   co-enzyme Q-10 30 MG capsule Take 100 mg by mouth 3 (three) times daily.     Evolocumab (REPATHA SURECLICK) 409 MG/ML SOAJ Inject 140 mg into the skin every 14 (fourteen) days. 2 mL 11   ezetimibe (ZETIA) 10 MG tablet TAKE (1) TABLET BY MOUTH ONCE DAILY. 30 tablet 5   furosemide (LASIX) 40 MG tablet Take 1 tablet (40 mg total) by mouth daily. 90 tablet 3   gabapentin (NEURONTIN) 100 MG capsule TAKE 1 CAPSULE BY MOUTH THREE TIMES A DAY. (Patient taking differently: Take 100 mg by mouth daily.) 90 capsule 0   Garlic 8119 MG CAPS Take 1,000 mg by mouth daily.     losartan (COZAAR) 25 MG tablet Take 1 tablet (25 mg total) by mouth daily. 90 tablet 1   nitroGLYCERIN (NITROSTAT) 0.4 MG SL tablet Place 1 tablet (0.4 mg total) under the tongue every 5 (five) minutes as needed for chest pain. 25 tablet 2   pantoprazole (PROTONIX) 40 MG tablet Take 1 tablet (40 mg total) by mouth daily. 90 tablet 1   Probiotic Product (PROBIOTIC-10 PO) Take 1 tablet by mouth daily. 18 thousand     Red Yeast Rice Extract (RED YEAST RICE PO) Take 2 tablets by mouth daily.     vitamin B-12 (CYANOCOBALAMIN) 1000 MCG tablet Take 1,000 mcg by mouth daily.     No current facility-administered medications for this visit.    Allergies:   Prednisone and Statins   Social History:  The patient  reports that he quit smoking about 15 years ago. His smoking use included cigarettes. He has a 0.50 pack-year smoking history. He has never used smokeless tobacco. He reports that he does not currently use alcohol. He reports that he does not currently use drugs after having used the following drugs: Marijuana.   Family History:  The patient's family history includes COPD in his father; Dementia in his mother; Diabetes in his father; Heart attack in his father; Hyperlipidemia in his  father; Hypertension in his father.    ROS:  Please see the history of present illness.   Otherwise, review of systems is positive for none.   All other systems are reviewed and negative.    PHYSICAL EXAM: VS:  There were no vitals taken for this visit. , BMI There is no height or weight on file to calculate BMI. GEN: Well nourished, well developed, in no acute distress  HEENT: normal  Neck: no JVD, carotid bruits, or masses Cardiac: RRR; no murmurs, rubs, or gallops,no edema  Respiratory:  clear to auscultation bilaterally, normal work of breathing GI: soft, nontender, nondistended, + BS MS: no deformity or atrophy  Skin: warm and dry Neuro:  Strength and sensation are intact Psych: euthymic mood, full affect  EKG:  EKG is ordered today. Personal review of the ekg ordered shows sinus rhythm  Recent Labs: 05/24/2022: TSH 1.230 06/23/2022: B Natriuretic Peptide 293.0 06/24/2022: Hemoglobin 17.3; Magnesium 2.1; Platelets 170 07/27/2022: ALT 25; BUN 31; Creatinine, Ser 1.12; Potassium 3.9; Sodium 142    Lipid Panel     Component Value Date/Time   CHOL 203 (H) 07/27/2022 0811   CHOL 242 (H) 05/24/2022 0804   TRIG 181 (H) 07/27/2022 0811   HDL 49 07/27/2022 0811   HDL 48 05/24/2022 0804   CHOLHDL 4.1 07/27/2022 0811   VLDL 36 07/27/2022 0811   LDLCALC 118 (H) 07/27/2022 0811   LDLCALC 171 (H) 05/24/2022 0804   LDLCALC 162 (H) 02/16/2021 0940     Wt Readings from Last 3 Encounters:  07/19/22 199 lb 9.6 oz (90.5 kg)  07/07/22 190 lb (86.2 kg)  06/25/22 186 lb 15.2 oz (84.8 kg)      Other studies Reviewed: Additional studies/ records that were reviewed today include: TTE 05/2022  Review of the above records today demonstrates:  Limited Echo: 06/23/2022  1. Limited echo for LVEF   2. Left ventricular ejection fraction, by estimation, is 45 to 50%. Left  ventricular ejection fraction by 2D MOD biplane is 47.4 %. The left  ventricle has mildly decreased function. The left  ventricle demonstrates  global hypokinesis.   3. The inferior vena cava is normal in size with greater than 50%  respiratory variability, suggesting right atrial pressure of 3 mmHg.   Cardiac Telemetry: 05/2022 personally reviewed ZIO AT reviewed.  11 days, 2 hours analyzed. 1.  Predominant rhythm is sinus with prolonged PR interval, heart rate ranging from 36 bpm up to 122 bpm with average heart rate 71 bpm. 2.  There were occasional PACs representing 2.7% total beats with otherwise rare atrial couplets and triplets representing less than 1% total beats. 3.  There were frequent PVCs representing 17.1% total beats with occasional ventricular couplets representing 4.8% total beats, and otherwise rare ventricular triplets representing less than 1% total beats.  Ventricular bigeminy and trigeminy were also noted. 4.  Multiple episodes of NSVT were noted.  Longest episode was described at 22 seconds, however looks to be more of a combination of fusion beats with more brief episodes of NSVT, rather than a sustained event. 5.  There were 10 episodes of PSVT, the longest of which was 16 beats. 6.  Patient triggered episodes loosely correlated with PVCs.  LHC 05/2022 Severe mid to distal LAD stenosis. Successful PTCA/DES x 1 mid to distal LAD Severe Diagonal 1 stenosis. Successful PTCA/DES x 1 Diagonal Severe distal Circumflex stenosis. Successful PTCA/DES x 2 distal Circumflex Moderate ostial RCA stenosis. Large dominant RCA  ASSESSMENT AND PLAN:  1.  PVCs: 17% burden on cardiac monitor.  Currently on amiodarone 200 mg twice daily.  High risk medication monitoring for amiodarone.  He is feeling poorly which could be due to his amiodarone.  He is on high dose currently.  We Quinn Quam reduce his amiodarone to 100 mg daily.  He Alyscia Carmon follow-up with an EP app in 1 month.  If he is still feeling poorly, would stop amiodarone and potentially start him on mexiletine.  He would potentially be an ablation candidate  as well, but all medications would need to be stopped as he is not having PVCs today.  2.  Coronary artery disease: Cath with severe three-vessel disease.  Status post multiple stents.  Continue aspirin and Plavix per primary cardiology.  3.  Chronic systolic heart failure: Due to likely a combination of PVCs and coronary artery disease.  Currently on optimal medical therapy per primary cardiology.  Continue with current management.  No obvious volume overload.  4.  Hypertension: Currently well controlled  5.  Hyperlipidemia: Continue management per primary cardiology  Current medicines are reviewed at length with the patient today.   The patient does not have concerns regarding his medicines.  The following changes were made today: Reduce amiodarone  Labs/ tests ordered today include:  No orders of the defined types were placed in this encounter.    Disposition:   FU 1 months  Signed, Delenn Ahn Meredith Leeds, MD  09/12/2022 8:58 AM     [address] [place] [address] [phone] (office) [phone] (fax)

## 2022-09-12 NOTE — Patient Instructions (Addendum)
Medication Instructions:  Your physician has recommended you make the following change in your medication:  DECREASE Amiodarone to 100 mg ONCE daily  *If you need a refill on your cardiac medications before your next appointment, please call your pharmacy*   Lab Work: None ordered   Testing/Procedures: None ordered   Follow-Up: At [facility], you and your health needs are our priority.  As part of our continuing mission to provide you with exceptional heart care, we have created designated Provider Care Teams.  These Care Teams include your primary Cardiologist (physician) and Advanced Practice Providers (APPs -  Physician Assistants and Nurse Practitioners) who all work together to provide you with the care you need, when you need it.  Your next appointment:   1 month(s)  The format for your next appointment:   In Person  Provider:   You will see one of the following Advanced Practice Providers on your designated Care Team:   Tommye Standard, [place] Legrand Como "Jonni Sanger" Chalmers Cater, [place]     Thank you for choosing [facility]!!   Trinidad Curet, RN [phone]  Other Instructions   Important Information About Sugar

## 2022-09-13 DIAGNOSIS — E7801 Familial hypercholesterolemia: Secondary | ICD-10-CM | POA: Diagnosis not present

## 2022-09-13 DIAGNOSIS — Z8739 Personal history of other diseases of the musculoskeletal system and connective tissue: Secondary | ICD-10-CM | POA: Diagnosis not present

## 2022-09-13 DIAGNOSIS — K219 Gastro-esophageal reflux disease without esophagitis: Secondary | ICD-10-CM | POA: Diagnosis not present

## 2022-09-13 DIAGNOSIS — Z23 Encounter for immunization: Secondary | ICD-10-CM | POA: Diagnosis not present

## 2022-09-13 DIAGNOSIS — I2510 Atherosclerotic heart disease of native coronary artery without angina pectoris: Secondary | ICD-10-CM | POA: Diagnosis not present

## 2022-09-13 DIAGNOSIS — R030 Elevated blood-pressure reading, without diagnosis of hypertension: Secondary | ICD-10-CM | POA: Diagnosis not present

## 2022-09-13 DIAGNOSIS — T466X5A Adverse effect of antihyperlipidemic and antiarteriosclerotic drugs, initial encounter: Secondary | ICD-10-CM | POA: Diagnosis not present

## 2022-09-13 DIAGNOSIS — G720 Drug-induced myopathy: Secondary | ICD-10-CM | POA: Diagnosis not present

## 2022-09-13 DIAGNOSIS — R200 Anesthesia of skin: Secondary | ICD-10-CM | POA: Diagnosis not present

## 2022-09-13 DIAGNOSIS — E349 Endocrine disorder, unspecified: Secondary | ICD-10-CM | POA: Diagnosis not present

## 2022-09-13 DIAGNOSIS — M79646 Pain in unspecified finger(s): Secondary | ICD-10-CM | POA: Diagnosis not present

## 2022-09-13 DIAGNOSIS — Z8673 Personal history of transient ischemic attack (TIA), and cerebral infarction without residual deficits: Secondary | ICD-10-CM | POA: Diagnosis not present

## 2022-09-20 DIAGNOSIS — Z6832 Body mass index (BMI) 32.0-32.9, adult: Secondary | ICD-10-CM | POA: Diagnosis not present

## 2022-09-20 DIAGNOSIS — R030 Elevated blood-pressure reading, without diagnosis of hypertension: Secondary | ICD-10-CM | POA: Diagnosis not present

## 2022-09-20 DIAGNOSIS — S6701XS Crushing injury of right thumb, sequela: Secondary | ICD-10-CM | POA: Diagnosis not present

## 2022-10-11 NOTE — Progress Notes (Unsigned)
Cardiology Office Note Date:  10/11/2022  Patient ID:  Jeffrey, Allen [birth year], MRN [id number] PCP:  [place] Nation, MD  Cardiologist:  Dr. Domenic Polite Electrophysiologist: Dr. Curt Bears  ***refresh   Chief Complaint: *** 1 mo  History of Present Illness: Jeffrey Allen is a 66 y.o. male with history of CAD (s/p cath on 05/26/2022 showing severe 3-vessel CAD with DESx1 to mid/distal LAD, DESx1 to D1 and DESx2 to distal LCx), PVCs, HTN, HLD, stroke.  June 2023 admitted to [facility] w/progressive abd complaints, dizziness, HR 30's with frequent PVCs, started on acebutalol >> amiodarone.  LVEF 45-50% suspect 2/2 PVCs.  Referred to Dr. Curt Bears, saw him 09/12/22, he was feeling more SOB, no cardiac awareness/PVCs, remained on BID amiodarone and reduced to '100mg'$  daily dosing. Planned to f/u with EP APP in a month, if not feeling better would stop amio and consider mexiletine. Another option would be an ablation though would need to stop meds in prep for that.   *** High res CT?, SOB *** symptoms *** PVCs? *** volume *** recheck echo without PVCs    AAD hx Amiodarone started Jun 2023 for PVCs  Past Medical History:  Diagnosis Date   CAD (coronary artery disease)    a. 05/2022: DES placement x1 to the mid/distal LAD, DES placement x1 to D1 and DES placement x2 to the distal LCx   GERD (gastroesophageal reflux disease)    Gout    High cholesterol    Obesity (BMI 30.0-34.9)    Peptic ulcer disease    Prediabetes    Stroke [facility]) 06/2017    Past Surgical History:  Procedure Laterality Date   BIOPSY  11/25/2020   Procedure: BIOPSY;  Surgeon: Daneil Dolin, MD;  [facility];  Service: Endoscopy;;  gastric   COLONOSCOPY     COLONOSCOPY N/A 12/07/2016   Scattered small and large-mouthed diverticula in sigmoid and descending colon. Six semi-pedunculated polyps in sigmoid and ascending colon, 4-6 mm in size. Distal TI normal appearing. Hyperplastic and tubular  adenomas. 3 year surveillance recommended.    COLONOSCOPY N/A 11/25/2020   diverticulosis in sigmoid and descending colon, three 3-4 mm polyps at rectosigmoid colon and hepatic flexure. Hyperplastic polyp. 5 year colonoscopy.    CORONARY STENT INTERVENTION N/A 05/26/2022   Procedure: CORONARY STENT INTERVENTION;  Surgeon: Burnell Blanks, MD;  Location: [place] [facility];  Service: Cardiovascular;  Laterality: N/A;   ESOPHAGOGASTRODUODENOSCOPY N/A 11/25/2020     normal esophagus, portal gastropathy, normal duodenum. Mild chronic gastritis, negative H.pylori.    LEFT HEART CATH AND CORS/GRAFTS ANGIOGRAPHY N/A 05/26/2022   Procedure: LEFT HEART CATH AND CORS/GRAFTS ANGIOGRAPHY;  Surgeon: Burnell Blanks, MD;  Location: [place] [facility];  Service: Cardiovascular;  Laterality: N/A;   POLYPECTOMY  11/25/2020   Procedure: POLYPECTOMY;  Surgeon: Daneil Dolin, MD;  [facility];  Service: Endoscopy;;   TOOTH EXTRACTION      Current Outpatient Medications  Medication Sig Dispense Refill   allopurinol (ZYLOPRIM) 300 MG tablet Take 0.5 tablets (150 mg total) by mouth daily. 30 tablet 1   amiodarone (PACERONE) 200 MG tablet Take 0.5 tablets (100 mg total) by mouth daily. 45 tablet 3   Ascorbic Acid (VITAMIN C PO) Take 1 tablet by mouth daily.     aspirin EC 81 MG tablet Take 1 tablet (81 mg total) by mouth daily with breakfast. 30 tablet 12   Cholecalciferol (VITAMIN D3) 125 MCG (5000 UT)  TABS Take 10,000 Units by mouth daily.      clopidogrel (PLAVIX) 75 MG tablet Take 1 tablet (75 mg total) by mouth daily. 30 tablet 4   co-enzyme Q-10 30 MG capsule Take 100 mg by mouth 3 (three) times daily.     Evolocumab (REPATHA SURECLICK) 683 MG/ML SOAJ Inject 140 mg into the skin every 14 (fourteen) days. (Patient not taking: Reported on 09/12/2022) 2 mL 11   ezetimibe (ZETIA) 10 MG tablet TAKE (1) TABLET BY MOUTH ONCE DAILY. 30 tablet 5   furosemide (LASIX) 40 MG tablet Take 1  tablet (40 mg total) by mouth daily. 90 tablet 3   gabapentin (NEURONTIN) 100 MG capsule TAKE 1 CAPSULE BY MOUTH THREE TIMES A DAY. (Patient taking differently: Take 100 mg by mouth daily.) 90 capsule 0   Garlic 4196 MG CAPS Take 1,000 mg by mouth daily.     losartan (COZAAR) 25 MG tablet Take 1 tablet (25 mg total) by mouth daily. 90 tablet 1   nitroGLYCERIN (NITROSTAT) 0.4 MG SL tablet Place 1 tablet (0.4 mg total) under the tongue every 5 (five) minutes as needed for chest pain. 25 tablet 2   pantoprazole (PROTONIX) 40 MG tablet Take 1 tablet (40 mg total) by mouth daily. 90 tablet 1   Probiotic Product (PROBIOTIC-10 PO) Take 1 tablet by mouth daily. 18 thousand     Red Yeast Rice Extract (RED YEAST RICE PO) Take 2 tablets by mouth daily.     vitamin B-12 (CYANOCOBALAMIN) 1000 MCG tablet Take 1,000 mcg by mouth daily.     No current facility-administered medications for this visit.    Allergies:   Prednisone and Statins   Social History:  The patient  reports that he quit smoking about 15 years ago. His smoking use included cigarettes. He has a 0.50 pack-year smoking history. He has never used smokeless tobacco. He reports that he does not currently use alcohol. He reports that he does not currently use drugs after having used the following drugs: Marijuana.   Family History:  The patient's family history includes COPD in his father; Dementia in his mother; Diabetes in his father; Heart attack in his father; Hyperlipidemia in his father; Hypertension in his father.***  ROS:  Please see the history of present illness.    All other systems are reviewed and otherwise negative.   PHYSICAL EXAM:  VS:  There were no vitals taken for this visit. BMI: There is no height or weight on file to calculate BMI. Well nourished, well developed, in no acute distress HEENT: normocephalic, atraumatic Neck: no JVD, carotid bruits or masses Cardiac:  *** RRR; no significant murmurs, no rubs, or  gallops Lungs:  *** CTA b/l, no wheezing, rhonchi or rales Abd: soft, nontender MS: no deformity or *** atrophy Ext: *** no edema Skin: warm and dry, no rash Neuro:  No gross deficits appreciated Psych: euthymic mood, full affect   EKG:  Done today and reviewed by myself shows  ***  LHC: 05/2022  Ost RCA to Prox RCA lesion is 50% stenosed.   Mid Cx lesion is 99% stenosed.   Prox Cx lesion is 70% stenosed.   1st Diag lesion is 80% stenosed.   Dist LAD lesion is 90% stenosed.   A drug-eluting stent was successfully placed using a STENT ONYX FRONTIER 2.25X18.   A drug-eluting stent was successfully placed using a STENT ONYX FRONTIER 3.0X12.   A drug-eluting stent was successfully placed using a SYNERGY XD 2.25X16.  A drug-eluting stent was successfully placed using a SYNERGY XD 2.25X12.   Post intervention, there is a 0% residual stenosis.   Post intervention, there is a 0% residual stenosis.   Post intervention, there is a 0% residual stenosis.   Post intervention, there is a 0% residual stenosis.   Severe mid to distal LAD stenosis. Successful PTCA/DES x 1 mid to distal LAD Severe Diagonal 1 stenosis. Successful PTCA/DES x 1 Diagonal Severe distal Circumflex stenosis. Successful PTCA/DES x 2 distal Circumflex Moderate ostial RCA stenosis. Large dominant RCA   Recommendations: Continue DAPT with ASA and Brilinta for one year. Continue statin and beta blocker.    Cardiac Telemetry: 05/2022 ZIO AT reviewed.  11 days, 2 hours analyzed. 1.  Predominant rhythm is sinus with prolonged PR interval, heart rate ranging from 36 bpm up to 122 bpm with average heart rate 71 bpm. 2.  There were occasional PACs representing 2.7% total beats with otherwise rare atrial couplets and triplets representing less than 1% total beats. 3.  There were frequent PVCs representing 17.1% total beats with occasional ventricular couplets representing 4.8% total beats, and otherwise rare ventricular triplets  representing less than 1% total beats.  Ventricular bigeminy and trigeminy were also noted. 4.  Multiple episodes of NSVT were noted.  Longest episode was described at 22 seconds, however looks to be more of a combination of fusion beats with more brief episodes of NSVT, rather than a sustained event. 5.  There were 10 episodes of PSVT, the longest of which was 16 beats. 6.  Patient triggered episodes loosely correlated with PVCs.   Limited Echo: 06/23/2022 IMPRESSIONS   1. Limited echo for LVEF   2. Left ventricular ejection fraction, by estimation, is 45 to 50%. Left  ventricular ejection fraction by 2D MOD biplane is 47.4 %. The left  ventricle has mildly decreased function. The left ventricle demonstrates  global hypokinesis.   3. The inferior vena cava is normal in size with greater than 50%  respiratory variability, suggesting right atrial pressure of 3 mmHg.   Comparison(s): Changes from prior study are noted. 05/26/2022: LVEF 50-55%.   Recent Labs: 05/24/2022: TSH 1.230 06/23/2022: B Natriuretic Peptide 293.0 06/24/2022: Hemoglobin 17.3; Magnesium 2.1; Platelets 170 07/27/2022: ALT 25; BUN 31; Creatinine, Ser 1.12; Potassium 3.9; Sodium 142  07/27/2022: Cholesterol 203; HDL 49; LDL Cholesterol 118; Total CHOL/HDL Ratio 4.1; Triglycerides 181; VLDL 36   CrCl cannot be calculated (Patient's most recent lab result is older than the maximum 21 days allowed.).   Wt Readings from Last 3 Encounters:  09/12/22 206 lb 3.2 oz (93.5 kg)  07/19/22 199 lb 9.6 oz (90.5 kg)  07/07/22 190 lb (86.2 kg)     Other studies reviewed: Additional studies/records reviewed today include: summarized above  ASSESSMENT AND PLAN:  PVCs Amiodarone ***  HFmrEF Chronic CHF Suspect 2/2 PVCs ***  HTN ***  CAD ***  Disposition: F/u with ***  Current medicines are reviewed at length with the patient today.  The patient did not have any concerns regarding medicines.  Venetia Night,  PA-C 10/11/2022 7:18 AM     [facility] [address] [place] [address] [phone] (office)  [phone] (fax)

## 2022-10-12 ENCOUNTER — Ambulatory Visit: Payer: 59 | Attending: Physician Assistant | Admitting: Physician Assistant

## 2022-10-12 ENCOUNTER — Encounter: Payer: Self-pay | Admitting: Physician Assistant

## 2022-10-12 ENCOUNTER — Ambulatory Visit: Payer: 59 | Attending: Physician Assistant

## 2022-10-12 ENCOUNTER — Telehealth: Payer: Self-pay | Admitting: Pharmacist

## 2022-10-12 ENCOUNTER — Encounter: Payer: Self-pay | Admitting: Pharmacist

## 2022-10-12 VITALS — BP 126/84 | HR 64 | Ht 67.0 in | Wt 206.6 lb

## 2022-10-12 DIAGNOSIS — I429 Cardiomyopathy, unspecified: Secondary | ICD-10-CM | POA: Diagnosis not present

## 2022-10-12 DIAGNOSIS — I493 Ventricular premature depolarization: Secondary | ICD-10-CM

## 2022-10-12 DIAGNOSIS — I10 Essential (primary) hypertension: Secondary | ICD-10-CM

## 2022-10-12 DIAGNOSIS — I2510 Atherosclerotic heart disease of native coronary artery without angina pectoris: Secondary | ICD-10-CM | POA: Diagnosis not present

## 2022-10-12 DIAGNOSIS — Z79899 Other long term (current) drug therapy: Secondary | ICD-10-CM | POA: Diagnosis not present

## 2022-10-12 LAB — TSH: TSH: 1.89 u[IU]/mL (ref 0.450–4.500)

## 2022-10-12 LAB — COMPREHENSIVE METABOLIC PANEL
ALT: 22 IU/L (ref 0–44)
AST: 22 IU/L (ref 0–40)
Albumin/Globulin Ratio: 2.1 (ref 1.2–2.2)
Albumin: 4.7 g/dL (ref 3.9–4.9)
Alkaline Phosphatase: 66 IU/L (ref 44–121)
BUN/Creatinine Ratio: 19 (ref 10–24)
BUN: 24 mg/dL (ref 8–27)
Bilirubin Total: 0.4 mg/dL (ref 0.0–1.2)
CO2: 25 mmol/L (ref 20–29)
Calcium: 9.6 mg/dL (ref 8.6–10.2)
Chloride: 104 mmol/L (ref 96–106)
Creatinine, Ser: 1.26 mg/dL (ref 0.76–1.27)
Globulin, Total: 2.2 g/dL (ref 1.5–4.5)
Glucose: 98 mg/dL (ref 70–99)
Potassium: 4.5 mmol/L (ref 3.5–5.2)
Sodium: 142 mmol/L (ref 134–144)
Total Protein: 6.9 g/dL (ref 6.0–8.5)
eGFR: 64 mL/min/{1.73_m2} (ref >59)

## 2022-10-12 MED ORDER — PRALUENT 150 MG/ML ~~LOC~~ SOAJ: 1.0000 mL | SUBCUTANEOUS | 11 refills | Status: DC

## 2022-10-12 NOTE — Progress Notes (Unsigned)
Enrolled for [employer] to mail a ZIO XT long term holter monitor to the patients address on file.   Dr. Camnitz to read. 

## 2022-10-12 NOTE — Telephone Encounter (Signed)
Praluent approved through 10/04/23. Pt made aware. Rx sent to pharmacy.  Rescheduled labs for 01/04/23.

## 2022-10-12 NOTE — Patient Instructions (Addendum)
Medication Instructions:   Your physician recommends that you continue on your current medications as directed. Please refer to the Current Medication list given to you today.   *If you need a refill on your cardiac medications before your next appointment, please call your pharmacy*   Lab Work: CMET AND TSH TODAY    If you have labs (blood work) drawn today and your tests are completely normal, you will receive your results only by: [place] (if you have MyChart) OR A paper copy in the mail If you have any lab test that is abnormal or we need to change your treatment, we will call you to review the results.   Testing/Procedures: Your physician has requested that you have an echocardiogram. Echocardiography is a painless test that uses sound waves to create images of your heart. It provides your doctor with information about the size and shape of your heart and how well your heart's chambers and valves are working. This procedure takes approximately one hour. There are no restrictions for this procedure. Please do NOT wear cologne, perfume, aftershave, or lotions (deodorant is allowed). Please arrive 15 minutes prior to your appointment time.   Your physician has recommended that you wear an event monitor. Event monitors are medical devices that record the heart's electrical activity. Doctors most often [place] these monitors to diagnose arrhythmias. Arrhythmias are problems with the speed or rhythm of the heartbeat. The monitor is a small, portable device. You can wear one while you do your normal daily activities. This is usually used to diagnose what is causing palpitations/syncope (passing out).    Follow-Up: At [facility], you and your health needs are our priority.  As part of our continuing mission to provide you with exceptional heart care, we have created designated Provider Care Teams.  These Care Teams include your primary Cardiologist (physician) and Advanced  Practice Providers (APPs -  Physician Assistants and Nurse Practitioners) who all work together to provide you with the care you need, when you need it.  We recommend signing up for the patient portal called "MyChart".  Sign up information is provided on this After Visit Summary.  MyChart is used to connect with patients for Virtual Visits (Telemedicine).  Patients are able to view lab/test results, encounter notes, upcoming appointments, etc.  Non-urgent messages can be sent to your provider as well.   To learn more about what you can do with MyChart, go to [email or web address].    Your next appointment:   3-4 month(s)  The format for your next appointment:   In Person  Provider:   You may see Will Meredith Leeds, MD or one of the following Advanced Practice Providers on your designated Care Team:   Tommye Standard, [place] Legrand Como "Jonni Sanger" Chalmers Cater, [place]    Other Instructions  Bryn Gulling- Long Term Monitor Instructions  Your physician has requested you wear a ZIO patch monitor for 3 days.  This is a single patch monitor. [employer] supplies one patch monitor per enrollment. Additional stickers are not available. Please do not apply patch if you will be having a Nuclear Stress Test,  Echocardiogram, Cardiac CT, MRI, or Chest Xray during the period you would be wearing the  monitor. The patch cannot be worn during these tests. You cannot remove and re-apply the  ZIO XT patch monitor.  Your ZIO patch monitor will be mailed 3 day USPS to your address on file. It may take 3-5 days  to receive your monitor after you have  been enrolled.  Once you have received your monitor, please review the enclosed instructions. Your monitor  has already been registered assigning a specific monitor serial # to you.  Billing and Patient Assistance Program Information  We have supplied [employer] with any of your insurance information on file for billing purposes. [employer] offers a sliding scale Patient Assistance  Program for patients that do not have  insurance, or whose insurance does not completely cover the cost of the ZIO monitor.  You must apply for the Patient Assistance Program to qualify for this discounted rate.  To apply, please call [employer] at [phone], select option 4, select option 2, ask to apply for  Patient Assistance Program. Theodore Demark will ask your household income, and how many people  are in your household. They will quote your out-of-pocket cost based on that information.  [employer] will also be able to set up a 39-month interest-free payment plan if needed.  Applying the monitor   Shave hair from upper left chest.  Hold abrader disc by orange tab. Rub abrader in 40 strokes over the upper left chest as  indicated in your monitor instructions.  Clean area with 4 enclosed alcohol pads. Let dry.  Apply patch as indicated in monitor instructions. Patch will be placed under collarbone on left  side of chest with arrow pointing upward.  Rub patch adhesive wings for 2 minutes. Remove white label marked "1". Remove the white  label marked "2". Rub patch adhesive wings for 2 additional minutes.  While looking in a mirror, press and release button in center of patch. A small green light will  flash 3-4 times. This will be your only indicator that the monitor has been turned on.  Do not shower for the first 24 hours. You may shower after the first 24 hours.  Press the button if you feel a symptom. You will hear a small click. Record Date, Time and  Symptom in the Patient Logbook.  When you are ready to remove the patch, follow instructions on the last 2 pages of Patient  Logbook. Stick patch monitor onto the last page of Patient Logbook.  Place Patient Logbook in the blue and white box. Use locking tab on box and tape box closed  securely. The blue and white box has prepaid postage on it. Please place it in the mailbox as  soon as possible. Your physician should have your test results  approximately 7 days after the  monitor has [facility]  Call [facility]etat [phone] you have questions regarding  your ZIO XT patch monitor. Call them immediately if you see an orange light blinking on your  monitor.  If your monitor falls off in less than 4 days, contact our Monitor department at [phone]  If your monitor becomes loose or falls off after 4 days call [employer] at [phone]  suggestions on securing your monitor  Important Information About Sugar

## 2022-10-13 ENCOUNTER — Ambulatory Visit: Payer: 59 | Admitting: Cardiology

## 2022-10-13 NOTE — Progress Notes (Deleted)
Cardiology Office Note  Date: 10/13/2022   ID: Dhruva, Orndoff May 03, 1956, MRN [id number]  PCP:  Clifford Nation, MD  Cardiologist:  Rozann Lesches, MD Electrophysiologist:  Constance Haw, MD   No chief complaint on file.   History of Present Illness: Jeffrey Allen is a 66 y.o. male last seen in July by Ms. Strader PA-C, I reviewed the note.  Interval follow-up also noted with EP.  He has been treated for symptomatic PVCs on amiodarone with plan for follow-up monitoring to assess PVC burden and also echocardiogram to reevaluate LVEF.  His preference is to come off amiodarone ultimately.  Past Medical History:  Diagnosis Date   CAD (coronary artery disease)    a. 05/2022: DES placement x1 to the mid/distal LAD, DES placement x1 to D1 and DES placement x2 to the distal LCx   GERD (gastroesophageal reflux disease)    Gout    High cholesterol    Obesity (BMI 30.0-34.9)    Peptic ulcer disease    Prediabetes    Stroke [facility]) 06/2017    Past Surgical History:  Procedure Laterality Date   BIOPSY  11/25/2020   Procedure: BIOPSY;  Surgeon: Daneil Dolin, MD;  [facility];  Service: Endoscopy;;  gastric   COLONOSCOPY     COLONOSCOPY N/A 12/07/2016   Scattered small and large-mouthed diverticula in sigmoid and descending colon. Six semi-pedunculated polyps in sigmoid and ascending colon, 4-6 mm in size. Distal TI normal appearing. Hyperplastic and tubular adenomas. 3 year surveillance recommended.    COLONOSCOPY N/A 11/25/2020   diverticulosis in sigmoid and descending colon, three 3-4 mm polyps at rectosigmoid colon and hepatic flexure. Hyperplastic polyp. 5 year colonoscopy.    CORONARY STENT INTERVENTION N/A 05/26/2022   Procedure: CORONARY STENT INTERVENTION;  Surgeon: Burnell Blanks, MD;  Location: [place] CV LAB;  Service: Cardiovascular;  Laterality: N/A;   ESOPHAGOGASTRODUODENOSCOPY N/A 11/25/2020     normal esophagus, portal  gastropathy, normal duodenum. Mild chronic gastritis, negative H.pylori.    LEFT HEART CATH AND CORS/GRAFTS ANGIOGRAPHY N/A 05/26/2022   Procedure: LEFT HEART CATH AND CORS/GRAFTS ANGIOGRAPHY;  Surgeon: Burnell Blanks, MD;  Location: [place] CV LAB;  Service: Cardiovascular;  Laterality: N/A;   POLYPECTOMY  11/25/2020   Procedure: POLYPECTOMY;  Surgeon: Daneil Dolin, MD;  [facility];  Service: Endoscopy;;   TOOTH EXTRACTION      Current Outpatient Medications  Medication Sig Dispense Refill   Alirocumab (PRALUENT) 150 MG/ML SOAJ Inject 1 mL into the skin every 14 (fourteen) days. 2 mL 11   allopurinol (ZYLOPRIM) 300 MG tablet Take 0.5 tablets (150 mg total) by mouth daily. 30 tablet 1   amiodarone (PACERONE) 200 MG tablet Take 0.5 tablets (100 mg total) by mouth daily. 45 tablet 3   Ascorbic Acid (VITAMIN C PO) Take 1 tablet by mouth daily.     aspirin EC 81 MG tablet Take 1 tablet (81 mg total) by mouth daily with breakfast. 30 tablet 12   Cholecalciferol (VITAMIN D3) 125 MCG (5000 UT) TABS Take 10,000 Units by mouth daily.      clopidogrel (PLAVIX) 75 MG tablet Take 1 tablet (75 mg total) by mouth daily. 30 tablet 4   co-enzyme Q-10 30 MG capsule Take 100 mg by mouth 3 (three) times daily.     ezetimibe (ZETIA) 10 MG tablet TAKE (1) TABLET BY MOUTH ONCE DAILY. 30 tablet 5   furosemide (LASIX) 40 MG tablet  Take 1 tablet (40 mg total) by mouth daily. 90 tablet 3   gabapentin (NEURONTIN) 100 MG capsule TAKE 1 CAPSULE BY MOUTH THREE TIMES A DAY. (Patient taking differently: Take 100 mg by mouth daily.) 90 capsule 0   Garlic 4098 MG CAPS Take 1,000 mg by mouth daily.     losartan (COZAAR) 25 MG tablet Take 1 tablet (25 mg total) by mouth daily. 90 tablet 1   nitroGLYCERIN (NITROSTAT) 0.4 MG SL tablet Place 1 tablet (0.4 mg total) under the tongue every 5 (five) minutes as needed for chest pain. 25 tablet 2   pantoprazole (PROTONIX) 40 MG tablet Take 1 tablet (40 mg  total) by mouth daily. 90 tablet 1   Red Yeast Rice Extract (RED YEAST RICE PO) Take 2 tablets by mouth daily.     vitamin B-12 (CYANOCOBALAMIN) 1000 MCG tablet Take 1,000 mcg by mouth daily.     No current facility-administered medications for this visit.   Allergies:  Prednisone and Statins   Social History: The patient  reports that he quit smoking about 15 years ago. His smoking use included cigarettes. He has a 0.50 pack-year smoking history. He has never used smokeless tobacco. He reports that he does not currently use alcohol. He reports that he does not currently use drugs after having used the following drugs: Marijuana.   Family History: The patient's family history includes COPD in his father; Dementia in his mother; Diabetes in his father; Heart attack in his father; Hyperlipidemia in his father; Hypertension in his father.   ROS:  Please see the history of present illness. Otherwise, complete review of systems is positive for {NONE DEFAULTED:18576}.  All other systems are reviewed and negative.   Physical Exam: VS:  There were no vitals taken for this visit., BMI There is no height or weight on file to calculate BMI.  Wt Readings from Last 3 Encounters:  10/12/22 206 lb 9.6 oz (93.7 kg)  09/12/22 206 lb 3.2 oz (93.5 kg)  07/19/22 199 lb 9.6 oz (90.5 kg)    General: Patient appears comfortable at rest. HEENT: Conjunctiva and lids normal, oropharynx clear with moist mucosa. Neck: Supple, no elevated JVP or carotid bruits, no thyromegaly. Lungs: Clear to auscultation, nonlabored breathing at rest. Cardiac: Regular rate and rhythm, no S3 or significant systolic murmur, no pericardial rub. Abdomen: Soft, nontender, no hepatomegaly, bowel sounds present, no guarding or rebound. Extremities: No pitting edema, distal pulses 2+. Skin: Warm and dry. Musculoskeletal: No kyphosis. Neuropsychiatric: Alert and oriented x3, affect grossly appropriate.  ECG:  An ECG dated 09/12/2022 was  personally reviewed today and demonstrated:  Sinus bradycardia with nonspecific T wave changes.  Recent Labwork: 06/23/2022: B Natriuretic Peptide 293.0 06/24/2022: Hemoglobin 17.3; Magnesium 2.1; Platelets 170 10/12/2022: ALT 22; AST 22; BUN 24; Creatinine, Ser 1.26; Potassium 4.5; Sodium 142; TSH 1.890     Component Value Date/Time   CHOL 203 (H) 07/27/2022 0811   CHOL 242 (H) 05/24/2022 0804   TRIG 181 (H) 07/27/2022 0811   HDL 49 07/27/2022 0811   HDL 48 05/24/2022 0804   CHOLHDL 4.1 07/27/2022 0811   VLDL 36 07/27/2022 0811   LDLCALC 118 (H) 07/27/2022 0811   LDLCALC 171 (H) 05/24/2022 0804   LDLCALC 162 (H) 02/16/2021 0940    Other Studies Reviewed Today:  Echocardiogram 06/23/2022:  1. Limited echo for LVEF   2. Left ventricular ejection fraction, by estimation, is 45 to 50%. Left  ventricular ejection fraction by 2D MOD biplane  is 47.4 %. The left  ventricle has mildly decreased function. The left ventricle demonstrates  global hypokinesis.   3. The inferior vena cava is normal in size with greater than 50%  respiratory variability, suggesting right atrial pressure of 3 mmHg.   Assessment and Plan:   Medication Adjustments/Labs and Tests Ordered: Current medicines are reviewed at length with the patient today.  Concerns regarding medicines are outlined above.   Tests Ordered: No orders of the defined types were placed in this encounter.   Medication Changes: No orders of the defined types were placed in this encounter.   Disposition:  Follow up {follow up:15908}  Signed, [name], MD, [facility] 10/13/2022 1:50 PM    [facility] at [facility] [address] [place], [place] [address] Phone: [phone]; Fax: [phone]

## 2022-10-13 NOTE — Addendum Note (Signed)
Addended by: Drue Novel I on: 10/13/2022 08:41 AM   Modules accepted: Orders

## 2022-10-17 DIAGNOSIS — I493 Ventricular premature depolarization: Secondary | ICD-10-CM | POA: Diagnosis not present

## 2022-10-20 ENCOUNTER — Other Ambulatory Visit: Payer: 59

## 2022-10-28 ENCOUNTER — Ambulatory Visit (HOSPITAL_COMMUNITY): Payer: 59 | Attending: Internal Medicine

## 2022-10-28 DIAGNOSIS — I429 Cardiomyopathy, unspecified: Secondary | ICD-10-CM

## 2022-10-28 LAB — ECHOCARDIOGRAM COMPLETE
Area-P 1/2: 2.99 cm2
S' Lateral: 3.5 cm

## 2022-10-28 MED ORDER — PERFLUTREN LIPID MICROSPHERE
1.0000 mL | INTRAVENOUS | Status: AC | PRN
  Administered 2022-10-28: 2 mL via INTRAVENOUS

## 2022-12-08 DIAGNOSIS — M720 Palmar fascial fibromatosis [Dupuytren]: Secondary | ICD-10-CM | POA: Diagnosis not present

## 2022-12-08 DIAGNOSIS — G5603 Carpal tunnel syndrome, bilateral upper limbs: Secondary | ICD-10-CM | POA: Diagnosis not present

## 2023-01-04 ENCOUNTER — Ambulatory Visit: Payer: Self-pay | Attending: Physician Assistant

## 2023-01-04 DIAGNOSIS — E782 Mixed hyperlipidemia: Secondary | ICD-10-CM

## 2023-01-05 LAB — LIPID PANEL
Chol/HDL Ratio: 3.7 ratio (ref 0.0–5.0)
Cholesterol, Total: 230 mg/dL — ABNORMAL HIGH (ref 100–199)
HDL: 63 mg/dL (ref >39)
LDL Chol Calc (NIH): 144 mg/dL — ABNORMAL HIGH (ref 0–99)
Triglycerides: 131 mg/dL (ref 0–149)
VLDL Cholesterol Cal: 23 mg/dL (ref 5–40)

## 2023-01-05 LAB — APOLIPOPROTEIN B: Apolipoprotein B: 121 mg/dL — ABNORMAL HIGH (ref <90)

## 2023-01-06 ENCOUNTER — Other Ambulatory Visit: Payer: Self-pay

## 2023-01-06 MED ORDER — CLOPIDOGREL BISULFATE 75 MG PO TABS: 75.0000 mg | ORAL_TABLET | Freq: Every day | ORAL | 1 refills | Status: DC

## 2023-01-06 NOTE — Telephone Encounter (Signed)
Refilled plavix as requested

## 2023-01-09 ENCOUNTER — Telehealth: Payer: Self-pay | Admitting: Pharmacist

## 2023-01-09 NOTE — Telephone Encounter (Signed)
Called patient and LVM for him to call back. Calling to discuss labs. Only small reduction seen in LDL-C with the addition of PCSK9i. Will need to confrim adherence. Also check to see if pt is still taking ezetimibe or red yeast rice.

## 2023-01-26 DIAGNOSIS — Z0001 Encounter for general adult medical examination with abnormal findings: Secondary | ICD-10-CM | POA: Diagnosis not present

## 2023-01-26 DIAGNOSIS — K219 Gastro-esophageal reflux disease without esophagitis: Secondary | ICD-10-CM | POA: Diagnosis not present

## 2023-01-26 DIAGNOSIS — E7801 Familial hypercholesterolemia: Secondary | ICD-10-CM | POA: Diagnosis not present

## 2023-01-26 DIAGNOSIS — G720 Drug-induced myopathy: Secondary | ICD-10-CM | POA: Diagnosis not present

## 2023-01-26 DIAGNOSIS — Z8673 Personal history of transient ischemic attack (TIA), and cerebral infarction without residual deficits: Secondary | ICD-10-CM | POA: Diagnosis not present

## 2023-01-27 ENCOUNTER — Telehealth: Payer: Self-pay | Admitting: Student

## 2023-01-27 NOTE — Telephone Encounter (Signed)
N ew Message:     Patient saw his primary doctor(Dr Stana Bunting) yesterday. Patient's heart rate was 50. He told patient to see his Cardiologist.  STAT if HR is under 50 or over 120 (normal HR is 60-100 beats per minute)  What is your heart rate?  66 yesterday, last night- today he would not take it today-last week it was 31,32, 47 and 48   Do you have a log of your heart rate readings (document readings)?   Do you have any other symptoms? Just do not feel right sometimes-wife wanted to make an appointment as she was told by the doctor.  I made him an appointment with Melina Copa on Monday(01-30-23). Please call to evaluate .

## 2023-01-27 NOTE — Progress Notes (Unsigned)
Cardiology Office Note    Date:  01/30/2023   ID:  Jeffrey Allen, Jeffrey Allen [birth year], MRN [id number]  PCP:  Jeffrey Nation, MD  Cardiologist:  Rozann Lesches, MD  Electrophysiologist:  Constance Haw, MD   Chief Complaint: discuss low HR  History of Present Illness:   Jeffrey Allen is a 67 y.o. male with history of CAD (cath 05/2022 showing severe 3-vessel CAD with DESx1 to mid/distal LAD, DESx1 to D1 and DESx2 to distal LCx), frequent PVCs, NSVT, PVCs and bradycardia followed by EP, cardiomyopathy/chronic HFmrEF, HTN, HLD, GERD, gout, prediabetes and prior CVA who is seen for follow-up.  He was originally seen by our team in 2021 for mildly elevated troponin in the setting of Covid-19 infection, with preserved LV function. Outpatient f/u recommended. Stress test was ordered but patient did not pursue. He was lost to follow-up until admitted 05/2022 with chest pain with cath s/p PCI as above, EF 50-55%. He was also noted to have frequent PVCs with subsequent OP monitor showing 17% PVCs as well as NSVT and PSVT. F/u limited echo 05/2022 showed EF 45-50%. He has subsequently followed with EP - he was trialed on acebutolol limited by bradycardia so was transitioned to amiodarone. At f/u 08/2022 he was feeling poorly so this was decreased to '100mg'$  daily. He was last seen by their team 10/2022. Note indicates the patient wanted to come off amiodarone at that time, recommended to complete testing before making this decision. Repeat echo showed EF 45-50%, global HK, mild LAE, trivial MR. Repeat monitor 10/2022 showed <1% PACs, 2.4% PVCs. He was continued on amiodarone '100mg'$  daily.  He returns for follow-up with his wife today. They scheduled appt due to recent low HR reading. On 1/25 he was checking routine VS and got HR 31 and then 32. He was asymptomatic at the time. BPs have been normal. HR later that day at doctor's office was 50. He otherwise reports he is "feeling great." However, his wife  urges him to be honest and states that he will often complain of feeling hungover after taking amiodarone daily so has moved it to every other day and is feeling better. They clarify he is not currently taking the Praluent injection. His wife read the side effects and was scared of them. She states he is very sensitive to medicines. She prayed about it and they opted not to take it. He also clarifies he is not on Lasix whatsoever. Otherwise he reports med adherence to the regimen outlined including DAPT and losartan. He denies any recent CP, SOB, orthopnea, edema, palpitations or syncope. Initial BP 136/96, recheck 124/82, the latter similar to home values.  Labwork independently reviewed: 12/2022 trig 131, LDL 144, TSH wnl, CMET wnl 05/2022 Hgb 17.3, plt 170, Mg 2.1   Past History   Past Medical History:  Diagnosis Date   Bradycardia    CAD (coronary artery disease)    a. 05/2022: DES placement x1 to the mid/distal LAD, DES placement x1 to D1 and DES placement x2 to the distal LCx   GERD (gastroesophageal reflux disease)    Gout    Heart failure with mildly reduced ejection fraction (HFmrEF) (HCC)    High cholesterol    NSVT (nonsustained ventricular tachycardia) (HCC)    Obesity (BMI 30.0-34.9)    Peptic ulcer disease    Prediabetes    PVC's (premature ventricular contractions)    Stroke ([place]) 06/2017   SVT (supraventricular tachycardia)     Past Surgical  History:  Procedure Laterality Date   BIOPSY  11/25/2020   Procedure: BIOPSY;  Surgeon: Daneil Dolin, MD;  [facility];  Service: Endoscopy;;  gastric   COLONOSCOPY     COLONOSCOPY N/A 12/07/2016   Scattered small and large-mouthed diverticula in sigmoid and descending colon. Six semi-pedunculated polyps in sigmoid and ascending colon, 4-6 mm in size. Distal TI normal appearing. Hyperplastic and tubular adenomas. 3 year surveillance recommended.    COLONOSCOPY N/A 11/25/2020   diverticulosis in sigmoid and  descending colon, three 3-4 mm polyps at rectosigmoid colon and hepatic flexure. Hyperplastic polyp. 5 year colonoscopy.    CORONARY STENT INTERVENTION N/A 05/26/2022   Procedure: CORONARY STENT INTERVENTION;  Surgeon: Burnell Blanks, MD;  Location: [facility];  Service: Cardiovascular;  Laterality: N/A;   ESOPHAGOGASTRODUODENOSCOPY N/A 11/25/2020     normal esophagus, portal gastropathy, normal duodenum. Mild chronic gastritis, negative H.pylori.    LEFT HEART CATH AND CORS/GRAFTS ANGIOGRAPHY N/A 05/26/2022   Procedure: LEFT HEART CATH AND CORS/GRAFTS ANGIOGRAPHY;  Surgeon: Burnell Blanks, MD;  Location: [facility];  Service: Cardiovascular;  Laterality: N/A;   POLYPECTOMY  11/25/2020   Procedure: POLYPECTOMY;  Surgeon: Daneil Dolin, MD;  [facility];  Service: Endoscopy;;   TOOTH EXTRACTION      Current Medications: Current Meds  Medication Sig   Alirocumab (PRALUENT) 150 MG/ML SOAJ Inject 1 mL into the skin every 14 (fourteen) days.   allopurinol (ZYLOPRIM) 300 MG tablet Take 0.5 tablets (150 mg total) by mouth daily.   amiodarone (PACERONE) 200 MG tablet Take 0.5 tablets (100 mg total) by mouth daily.   Ascorbic Acid (VITAMIN C PO) Take 1 tablet by mouth daily.   aspirin EC 81 MG tablet Take 1 tablet (81 mg total) by mouth daily with breakfast.   Cholecalciferol (VITAMIN D3) 125 MCG (5000 UT) TABS Take 10,000 Units by mouth daily.    clopidogrel (PLAVIX) 75 MG tablet Take 1 tablet (75 mg total) by mouth daily.   co-enzyme Q-10 30 MG capsule Take 100 mg by mouth 3 (three) times daily.   ezetimibe (ZETIA) 10 MG tablet TAKE (1) TABLET BY MOUTH ONCE DAILY.   furosemide (LASIX) 40 MG tablet Take 1 tablet (40 mg total) by mouth daily.   gabapentin (NEURONTIN) 100 MG capsule TAKE 1 CAPSULE BY MOUTH THREE TIMES A DAY. (Patient taking differently: Take 100 mg by mouth daily.)   Garlic 2505 MG CAPS Take 1,000 mg by mouth daily.   losartan (COZAAR)  25 MG tablet Take 1 tablet (25 mg total) by mouth daily.   nitroGLYCERIN (NITROSTAT) 0.4 MG SL tablet Place 1 tablet (0.4 mg total) under the tongue every 5 (five) minutes as needed for chest pain.   pantoprazole (PROTONIX) 40 MG tablet Take 1 tablet (40 mg total) by mouth daily.   Red Yeast Rice Extract (RED YEAST RICE PO) Take 2 tablets by mouth daily.   vitamin B-12 (CYANOCOBALAMIN) 1000 MCG tablet Take 1,000 mcg by mouth daily.      Allergies:   Prednisone and Statins   Social History   Socioeconomic History   Marital status: Married    Spouse name: Not on file   Number of children: Not on file   Years of education: Not on file   Highest education level: Not on file  Occupational History   Not on file  Tobacco Use   Smoking status: Former    Packs/day: 0.02  Years: 25.00    Total pack years: 0.50    Types: Cigarettes    Quit date: 12/26/2006    Years since quitting: 16.1   Smokeless tobacco: Never  Vaping Use   Vaping Use: Never used  Substance and Sexual Activity   Alcohol use: Not Currently   Drug use: Not Currently    Types: Marijuana   Sexual activity: Yes  Other Topics Concern   Not on file  Social History Narrative   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   Social Determinants of Health   Financial Resource Strain: Not on file  Food Insecurity: Not on file  Transportation Needs: Not on file  Physical Activity: Not on file  Stress: Not on file  Social Connections: Not on file     Family History:  The patient's family history includes COPD in his father; Dementia in his mother; Diabetes in his father; Heart attack in his father; Hyperlipidemia in his father; Hypertension in his father. There is no history of Colon cancer.  ROS:   Please see the history of present illness.  All other systems are reviewed and otherwise negative.    EKG(s)/Additional Testing   EKG:  EKG is ordered today, personally reviewed, demonstrating NSR  81bpm, first degree AVB, nonspecific ST Changes, nonacute, QTc 460m  CV Studies: Cardiac studies reviewed are outlined and summarized above. Otherwise please see EMR for full report.  Recent Labs: 06/23/2022: B Natriuretic Peptide 293.0 06/24/2022: Hemoglobin 17.3; Magnesium 2.1; Platelets 170 10/12/2022: ALT 22; BUN 24; Creatinine, Ser 1.26; Potassium 4.5; Sodium 142; TSH 1.890  Recent Lipid Panel    Component Value Date/Time   CHOL 230 (H) 01/04/2023 0819   TRIG 131 01/04/2023 0819   HDL 63 01/04/2023 0819   CHOLHDL 3.7 01/04/2023 0819   CHOLHDL 4.1 07/27/2022 0811   VLDL 36 07/27/2022 0811   LDLCALC 144 (H) 01/04/2023 0819   LDLCALC 162 (H) 02/16/2021 0940    PHYSICAL EXAM:    VS:  BP (!) 136/96   Pulse 81   Wt 196 lb (88.9 kg)   SpO2 95%   BMI 30.70 kg/m   BMI: Body mass index is 30.7 kg/m.  GEN: Well nourished, well developed male in no acute distress HEENT: normocephalic, atraumatic Neck: no JVD, carotid bruits, or masses Cardiac: RRR; no murmurs, rubs, or gallops, no edema  Respiratory:  clear to auscultation bilaterally, normal work of breathing GI: soft, nontender, nondistended, + BS MS: no deformity or atrophy Skin: warm and dry, no rash Neuro:  Alert and Oriented x 3, Strength and sensation are intact, follows commands Psych: euthymic mood, full affect  Wt Readings from Last 3 Encounters:  01/30/23 196 lb (88.9 kg)  10/12/22 206 lb 9.6 oz (93.7 kg)  09/12/22 206 lb 3.2 oz (93.5 kg)     ASSESSMENT & PLAN:   1. Frequent PVCs, NSVT, SVT, bradycardia - he and his wife report asymptomatic bradycardia by HR readings on 1/25. He did not have any symptoms at the time. It is unclear if this represents true bradycardia or perhaps recurrent PVCs not picking up on the monitor. He has not had any syncope. We discussed repeating monitor. He wishes to defer and discuss at upcoming EP appointment scheduled 2/14. ER precautions reviewed. He clarifies at this point he is  only taking amiodarone 1/2 tablet every other day, does not want to take daily, therefore we will update his rx to reflect this - he wants to discuss coming  off altogether with EP team. I will defer to EP in follow-up to review the plans for this. We'll update labs today to ensure liver, thyroid, electrolytes are stable. Further monitoring of organ systems while on amiodarone will be at discretion of EP team.  2. Cardiomyopathy/chronic HFmREF - appears euvolemic. Lasix removed from med list altogether as he is not taking and does not wish to add any other medicines to his regimen. Therefore, hold off SGLT2i, spironolactone at this time. Not on BB due to bradycardia. Continue losartan. Discussed monitoring for signs of fluid retention.  3. CAD, HLD - no recent anginal symptoms. Continue DAPT. Will arrange f/u in 4 months at which time long term duration can be reviewed. He is not on BB at the direction of EP above, h/o bradycardia. We discussed his uncontrolled LDL and PCSK9i. We discussed that I would not advise taking red yeast rice due to potential medication interaction and lack of clinical evidence that this would help his coronary disease. He does remain on Zetia. After our discussion they are amenable to re-trying the Praluent. He took one dose in the fall and did fine. They will notify if any new issues arise after starting this. Recommend f/u fasting lipid/liver function in 8 weeks.  4. Essential HTN - initial BP mildly elevated, recheck WNL. Home readings are normal. Continue losartan - he affirms he is taking.    Disposition: F/u with EP as scheduled 2/14. F/u Dr. Domenic Polite in 4 months.   Medication Adjustments/Labs and Tests Ordered: Current medicines are reviewed at length with the patient today.  Concerns regarding medicines are outlined above. Medication changes, Labs and Tests ordered today are summarized above and listed in the Patient Instructions accessible in Encounters.     Signed, Jeffrey Pitter, PA-C  01/30/2023 1:52 PM    [facility] in [place]. [place], [place] [address] Ph: [phone]; Fax [phone]

## 2023-01-27 NOTE — Telephone Encounter (Addendum)
Happy to meet pt on Monday. However, he also follows with EP for this issue (HR 30s with PVCs per last EP note) so I will route to [facility] and Dr. Curt Bears for input of any particular recommendations in the interim. I also notified Renee by secure chat to review, she will take a look per reply.

## 2023-01-30 ENCOUNTER — Other Ambulatory Visit (HOSPITAL_COMMUNITY)
Admission: RE | Admit: 2023-01-30 | Discharge: 2023-01-30 | Disposition: A | Discharge status: Home / Self Care | Payer: Medicare Other | Source: Ambulatory Visit | Attending: Physician Assistant | Admitting: Physician Assistant

## 2023-01-30 ENCOUNTER — Encounter (HOSPITAL_BASED_OUTPATIENT_CLINIC_OR_DEPARTMENT_OTHER): Payer: Self-pay

## 2023-01-30 ENCOUNTER — Encounter: Payer: Self-pay | Admitting: Physician Assistant

## 2023-01-30 ENCOUNTER — Ambulatory Visit: Payer: Medicare Other | Attending: Physician Assistant | Admitting: Physician Assistant

## 2023-01-30 ENCOUNTER — Emergency Department (HOSPITAL_BASED_OUTPATIENT_CLINIC_OR_DEPARTMENT_OTHER)
Admission: EM | Admit: 2023-01-30 | Discharge: 2023-01-31 | Disposition: A | Discharge status: Home / Self Care | Payer: Medicare Other | Attending: Emergency Medicine | Admitting: Emergency Medicine

## 2023-01-30 ENCOUNTER — Other Ambulatory Visit: Payer: Self-pay

## 2023-01-30 DIAGNOSIS — I493 Ventricular premature depolarization: Secondary | ICD-10-CM | POA: Diagnosis not present

## 2023-01-30 DIAGNOSIS — E785 Hyperlipidemia, unspecified: Secondary | ICD-10-CM | POA: Insufficient documentation

## 2023-01-30 DIAGNOSIS — Z87891 Personal history of nicotine dependence: Secondary | ICD-10-CM | POA: Insufficient documentation

## 2023-01-30 DIAGNOSIS — Z79899 Other long term (current) drug therapy: Secondary | ICD-10-CM | POA: Insufficient documentation

## 2023-01-30 DIAGNOSIS — I2510 Atherosclerotic heart disease of native coronary artery without angina pectoris: Secondary | ICD-10-CM

## 2023-01-30 DIAGNOSIS — Z7902 Long term (current) use of antithrombotics/antiplatelets: Secondary | ICD-10-CM | POA: Insufficient documentation

## 2023-01-30 DIAGNOSIS — Z7982 Long term (current) use of aspirin: Secondary | ICD-10-CM | POA: Diagnosis not present

## 2023-01-30 DIAGNOSIS — I4729 Other ventricular tachycardia: Secondary | ICD-10-CM

## 2023-01-30 DIAGNOSIS — N23 Unspecified renal colic: Secondary | ICD-10-CM | POA: Diagnosis not present

## 2023-01-30 DIAGNOSIS — I5022 Chronic systolic (congestive) heart failure: Secondary | ICD-10-CM | POA: Diagnosis not present

## 2023-01-30 DIAGNOSIS — I471 Supraventricular tachycardia, unspecified: Secondary | ICD-10-CM | POA: Diagnosis not present

## 2023-01-30 DIAGNOSIS — N201 Calculus of ureter: Secondary | ICD-10-CM | POA: Diagnosis not present

## 2023-01-30 DIAGNOSIS — R001 Bradycardia, unspecified: Secondary | ICD-10-CM | POA: Insufficient documentation

## 2023-01-30 DIAGNOSIS — R1031 Right lower quadrant pain: Secondary | ICD-10-CM | POA: Diagnosis not present

## 2023-01-30 DIAGNOSIS — Z8673 Personal history of transient ischemic attack (TIA), and cerebral infarction without residual deficits: Secondary | ICD-10-CM | POA: Diagnosis not present

## 2023-01-30 DIAGNOSIS — I10 Essential (primary) hypertension: Secondary | ICD-10-CM

## 2023-01-30 HISTORY — DX: Calculus of ureter: N20.1

## 2023-01-30 LAB — CBC
HCT: 45.7 % (ref 39.0–52.0)
HCT: 44.3 % (ref 39.0–52.0)
Hemoglobin: 15.7 g/dL (ref 13.0–17.0)
Hemoglobin: 15.3 g/dL (ref 13.0–17.0)
MCH: 31.5 pg (ref 26.0–34.0)
MCH: 31.1 pg (ref 26.0–34.0)
MCHC: 34.4 g/dL (ref 30.0–36.0)
MCHC: 34.5 g/dL (ref 30.0–36.0)
MCV: 91.8 fL (ref 80.0–100.0)
MCV: 90 fL (ref 80.0–100.0)
Platelets: 181 10*3/uL (ref 150–400)
Platelets: 181 10*3/uL (ref 150–400)
RBC: 4.98 MIL/uL (ref 4.22–5.81)
RBC: 4.92 MIL/uL (ref 4.22–5.81)
RDW: 12.6 % (ref 11.5–15.5)
RDW: 12.8 % (ref 11.5–15.5)
WBC: 8 10*3/uL (ref 4.0–10.5)
WBC: 9.4 10*3/uL (ref 4.0–10.5)
nRBC: 0 % (ref 0.0–0.2)
nRBC: 0 % (ref 0.0–0.2)

## 2023-01-30 LAB — COMPREHENSIVE METABOLIC PANEL
ALT: 21 U/L (ref 0–44)
ALT: 16 U/L (ref 0–44)
AST: 21 U/L (ref 15–41)
AST: 17 U/L (ref 15–41)
Albumin: 4.1 g/dL (ref 3.5–5.0)
Albumin: 4.3 g/dL (ref 3.5–5.0)
Alkaline Phosphatase: 45 U/L (ref 38–126)
Alkaline Phosphatase: 40 U/L (ref 38–126)
Anion gap: 10 (ref 5–15)
Anion gap: 12 (ref 5–15)
BUN: 24 mg/dL — ABNORMAL HIGH (ref 8–23)
BUN: 25 mg/dL — ABNORMAL HIGH (ref 8–23)
CO2: 24 mmol/L (ref 22–32)
CO2: 23 mmol/L (ref 22–32)
Calcium: 8.7 mg/dL — ABNORMAL LOW (ref 8.9–10.3)
Calcium: 9.3 mg/dL (ref 8.9–10.3)
Chloride: 103 mmol/L (ref 98–111)
Chloride: 106 mmol/L (ref 98–111)
Creatinine, Ser: 0.97 mg/dL (ref 0.61–1.24)
Creatinine, Ser: 0.88 mg/dL (ref 0.61–1.24)
GFR, Estimated: >60 mL/min (ref >60)
GFR, Estimated: >60 mL/min (ref >60)
Glucose, Bld: 99 mg/dL (ref 70–99)
Glucose, Bld: 115 mg/dL — ABNORMAL HIGH (ref 70–99)
Potassium: 3.5 mmol/L (ref 3.5–5.1)
Potassium: 3.7 mmol/L (ref 3.5–5.1)
Sodium: 137 mmol/L (ref 135–145)
Sodium: 141 mmol/L (ref 135–145)
Total Bilirubin: 0.8 mg/dL (ref 0.3–1.2)
Total Bilirubin: 0.4 mg/dL (ref 0.3–1.2)
Total Protein: 6.7 g/dL (ref 6.5–8.1)
Total Protein: 6.9 g/dL (ref 6.5–8.1)

## 2023-01-30 LAB — MAGNESIUM: Magnesium: 2.3 mg/dL (ref 1.7–2.4)

## 2023-01-30 LAB — URINALYSIS, ROUTINE W REFLEX MICROSCOPIC
Bacteria, UA: NONE SEEN
Bilirubin Urine: NEGATIVE
Glucose, UA: NEGATIVE mg/dL
Ketones, ur: NEGATIVE mg/dL
Leukocytes,Ua: NEGATIVE
Nitrite: NEGATIVE
Protein, ur: NEGATIVE mg/dL
RBC / HPF: >50 RBC/hpf (ref 0–5)
Specific Gravity, Urine: 1.024 (ref 1.005–1.030)
pH: 5.5 (ref 5.0–8.0)

## 2023-01-30 LAB — TSH: TSH: 1.244 u[IU]/mL (ref 0.350–4.500)

## 2023-01-30 LAB — LIPASE, BLOOD: Lipase: 35 U/L (ref 11–51)

## 2023-01-30 LAB — CBG MONITORING, ED: Glucose-Capillary: 182 mg/dL — ABNORMAL HIGH (ref 70–99)

## 2023-01-30 MED ORDER — AMIODARONE HCL 200 MG PO TABS: ORAL_TABLET | ORAL | 3 refills | Status: DC

## 2023-01-30 NOTE — ED Triage Notes (Signed)
Pt to ED pov from home, ambulatory to triage. Pt c/o right flank pain x 1 month that has gotten worse. Pain is radiating from lower back into lower abdomen on right side. Pt has had vomiting. Pt denies diarrhea and/or urinary symptoms.

## 2023-01-30 NOTE — ED Provider Notes (Signed)
[facility] Provider Note: Georgena Spurling, MD, FACEP  CSN: [id number] MRN: [id number] ARRIVAL: 01/30/23 at [facility]  Flank Pain   HISTORY OF PRESENT ILLNESS  01/30/23 11:58 PM Jeffrey Allen is a 67 y.o. male with 4 episodes of right flank pain radiating to his right lower quadrant over the past 4 weeks.  The most recent, and most severe, was about 5 PM this afternoon.  It was associated with nausea and vomiting and was severe.  It was somewhat worse with palpation of his right lower quadrant.  It resolved about 8 PM and he is denying pain at the present time.  On arrival he was rating his pain is a 5 out of 10.  He has not noticed hematuria or difficulty urinating.  He has had a kidney stone in the past.   Past Medical History:  Diagnosis Date   Bradycardia    CAD (coronary artery disease)    a. 05/2022: DES placement x1 to the mid/distal LAD, DES placement x1 to D1 and DES placement x2 to the distal LCx   GERD (gastroesophageal reflux disease)    Gout    Heart failure with mildly reduced ejection fraction (HFmrEF) (HCC)    High cholesterol    NSVT (nonsustained ventricular tachycardia) (HCC)    Obesity (BMI 30.0-34.9)    Peptic ulcer disease    Prediabetes    PVC's (premature ventricular contractions)    Stroke ([place]) 06/2017   SVT (supraventricular tachycardia)    Ureterolithiasis     Past Surgical History:  Procedure Laterality Date   BIOPSY  11/25/2020   Procedure: BIOPSY;  Surgeon: Daneil Dolin, MD;  [facility];  Service: Endoscopy;;  gastric   COLONOSCOPY     COLONOSCOPY N/A 12/07/2016   Scattered small and large-mouthed diverticula in sigmoid and descending colon. Six semi-pedunculated polyps in sigmoid and ascending colon, 4-6 mm in size. Distal TI normal appearing. Hyperplastic and tubular adenomas. 3 year surveillance recommended.    COLONOSCOPY N/A 11/25/2020   diverticulosis in sigmoid and descending  colon, three 3-4 mm polyps at rectosigmoid colon and hepatic flexure. Hyperplastic polyp. 5 year colonoscopy.    CORONARY STENT INTERVENTION N/A 05/26/2022   Procedure: CORONARY STENT INTERVENTION;  Surgeon: Burnell Blanks, MD;  Location: [facility];  Service: Cardiovascular;  Laterality: N/A;   ESOPHAGOGASTRODUODENOSCOPY N/A 11/25/2020     normal esophagus, portal gastropathy, normal duodenum. Mild chronic gastritis, negative H.pylori.    LEFT HEART CATH AND CORS/GRAFTS ANGIOGRAPHY N/A 05/26/2022   Procedure: LEFT HEART CATH AND CORS/GRAFTS ANGIOGRAPHY;  Surgeon: Burnell Blanks, MD;  Location: [facility];  Service: Cardiovascular;  Laterality: N/A;   POLYPECTOMY  11/25/2020   Procedure: POLYPECTOMY;  Surgeon: Daneil Dolin, MD;  [facility];  Service: Endoscopy;;   TOOTH EXTRACTION      Family History  Problem Relation Age of Onset   Dementia Mother    Hypertension Father    Diabetes Father    COPD Father    Hyperlipidemia Father    Heart attack Father    Colon cancer Neg Hx     Social History   Tobacco Use   Smoking status: Former    Packs/day: 0.02    Years: 25.00    Total pack years: 0.50    Types: Cigarettes    Quit date: 12/26/2006    Years since quitting: 16.1   Smokeless tobacco: Never  Vaping  Use   Vaping Use: Never used  Substance Use Topics   Alcohol use: Not Currently   Drug use: Not Currently    Types: Marijuana    Prior to Admission medications   Medication Sig Start Date End Date Taking? Authorizing Provider  Alirocumab (PRALUENT) 150 MG/ML SOAJ Inject 1 mL into the skin every 14 (fourteen) days. 10/12/22   [name], MD  allopurinol (ZYLOPRIM) 300 MG tablet Take 0.5 tablets (150 mg total) by mouth daily. 05/18/21   Doree Albee, MD  amiodarone (PACERONE) 200 MG tablet Take 1/2 tablet (100 mg) every OTHER day 01/30/23   Charlie Pitter, PA-C  Ascorbic Acid (VITAMIN C PO) Take 1 tablet by mouth daily.     [provider]  aspirin EC 81 MG tablet Take 1 tablet (81 mg total) by mouth daily with breakfast. 06/25/22   Denton Brick, Courage, MD  Cholecalciferol (VITAMIN D3) 125 MCG (5000 UT) TABS Take 10,000 Units by mouth daily.     [provider]  clopidogrel (PLAVIX) 75 MG tablet Take 1 tablet (75 mg total) by mouth daily. 01/06/23   [name], MD  co-enzyme Q-10 30 MG capsule Take 100 mg by mouth 3 (three) times daily.    [provider]  ezetimibe (ZETIA) 10 MG tablet TAKE (1) TABLET BY MOUTH ONCE DAILY. 07/18/22   [name], MD  gabapentin (NEURONTIN) 100 MG capsule TAKE 1 CAPSULE BY MOUTH THREE TIMES A DAY. Patient taking differently: Take 100 mg by mouth daily. 09/22/21   Lindell Spar, MD  Garlic 5498 MG CAPS Take 1,000 mg by mouth daily.    [provider]  losartan (COZAAR) 25 MG tablet Take 1 tablet (25 mg total) by mouth daily. 07/19/22   Strader, Fransisco Hertz, PA-C  nitroGLYCERIN (NITROSTAT) 0.4 MG SL tablet Place 1 tablet (0.4 mg total) under the tongue every 5 (five) minutes as needed for chest pain. 05/27/22   Cheryln Manly, NP  pantoprazole (PROTONIX) 40 MG tablet Take 1 tablet (40 mg total) by mouth daily. 07/14/21   Gosrani, Doristine Johns, MD  Red Yeast Rice Extract (RED YEAST RICE PO) Take 2 tablets by mouth daily.    [provider]  vitamin B-12 (CYANOCOBALAMIN) 1000 MCG tablet Take 1,000 mcg by mouth daily.    [provider]    Allergies Prednisone and Statins   REVIEW OF SYSTEMS  Negative except as noted here or in the History of Present Illness.   PHYSICAL EXAMINATION  Initial Vital Signs Blood pressure 119/83, pulse 63, temperature 98.4 F (36.9 C), temperature source Oral, resp. rate 18, height '5\' 7"'$  (1.702 m), weight 86.2 kg, SpO2 100 %.  Examination General: Well-developed, well-nourished male in no acute distress; appearance consistent with age of record HENT: normocephalic; atraumatic Eyes: Normal  appearance Neck: supple Heart: regular rate and rhythm Lungs: clear to auscultation bilaterally Abdomen: soft; nondistended; nontender; bowel sounds present Extremities: No deformity; full range of motion Neurologic: Awake, alert and oriented; motor function intact in all extremities and symmetric; no facial droop Skin: Warm and dry Psychiatric: Normal mood and affect   RESULTS  Summary of this visit's results, reviewed and interpreted by myself:   EKG Interpretation  Date/Time:    Ventricular Rate:    PR Interval:    QRS Duration:   QT Interval:    QTC Calculation:   R Axis:     Text Interpretation:         Laboratory  Studies: Results for orders placed or performed during the hospital encounter of 01/30/23 (from the past 24 hour(s))  Lipase, blood     Status: None   Collection Time: 01/30/23  7:30 PM  Result Value Ref Range   Lipase 35 11 - 51 U/L  Comprehensive metabolic panel     Status: Abnormal   Collection Time: 01/30/23  7:30 PM  Result Value Ref Range   Sodium 141 135 - 145 mmol/L   Potassium 3.7 3.5 - 5.1 mmol/L   Chloride 106 98 - 111 mmol/L   CO2 23 22 - 32 mmol/L   Glucose, Bld 115 (H) 70 - 99 mg/dL   BUN 25 (H) 8 - 23 mg/dL   Creatinine, Ser 0.88 0.61 - 1.24 mg/dL   Calcium 9.3 8.9 - 10.3 mg/dL   Total Protein 6.9 6.5 - 8.1 g/dL   Albumin 4.3 3.5 - 5.0 g/dL   AST 17 15 - 41 U/L   ALT 16 0 - 44 U/L   Alkaline Phosphatase 40 38 - 126 U/L   Total Bilirubin 0.4 0.3 - 1.2 mg/dL   GFR, Estimated >60 >60 mL/min   Anion gap 12 5 - 15  CBC     Status: None   Collection Time: 01/30/23  7:30 PM  Result Value Ref Range   WBC 9.4 4.0 - 10.5 K/uL   RBC 4.92 4.22 - 5.81 MIL/uL   Hemoglobin 15.3 13.0 - 17.0 g/dL   HCT 44.3 39.0 - 52.0 %   MCV 90.0 80.0 - 100.0 fL   MCH 31.1 26.0 - 34.0 pg   MCHC 34.5 30.0 - 36.0 g/dL   RDW 12.8 11.5 - 15.5 %   Platelets 181 150 - 400 K/uL   nRBC 0.0 0.0 - 0.2 %  Urinalysis, Routine w reflex microscopic -Urine, Clean  Catch     Status: Abnormal   Collection Time: 01/30/23  7:30 PM  Result Value Ref Range   Color, Urine YELLOW YELLOW   APPearance CLEAR CLEAR   Specific Gravity, Urine 1.024 1.005 - 1.030   pH 5.5 5.0 - 8.0   Glucose, UA NEGATIVE NEGATIVE mg/dL   Hgb urine dipstick LARGE (A) NEGATIVE   Bilirubin Urine NEGATIVE NEGATIVE   Ketones, ur NEGATIVE NEGATIVE mg/dL   Protein, ur NEGATIVE NEGATIVE mg/dL   Nitrite NEGATIVE NEGATIVE   Leukocytes,Ua NEGATIVE NEGATIVE   RBC / HPF >50 0 - 5 RBC/hpf   WBC, UA 0-5 0 - 5 WBC/hpf   Bacteria, UA NONE SEEN NONE SEEN   Squamous Epithelial / HPF 0-5 0 - 5 /HPF   Mucus PRESENT   CBG monitoring, ED     Status: Abnormal   Collection Time: 01/30/23  7:42 PM  Result Value Ref Range   Glucose-Capillary 182 (H) 70 - 99 mg/dL   Imaging Studies: CT Renal Stone Study  Result Date: 01/31/2023 CLINICAL DATA:  Right flank pain. EXAM: CT ABDOMEN AND PELVIS WITHOUT CONTRAST TECHNIQUE: Multidetector CT imaging of the abdomen and pelvis was performed following the standard protocol without IV contrast. RADIATION DOSE REDUCTION: This exam was performed according to the departmental dose-optimization program which includes automated exposure control, adjustment of the mA and/or kV according to patient size and/or use of iterative reconstruction technique. COMPARISON:  September 03, 2020 FINDINGS: Lower chest: No acute abnormality. Hepatobiliary: Stable 8 mm cystic appearing area is seen within the anteromedial aspect of the right lobe of the liver. No gallstones, gallbladder wall thickening, or biliary dilatation. Pancreas: Unremarkable.  No pancreatic ductal dilatation or surrounding inflammatory changes. Spleen: Normal in size without focal abnormality. Adrenals/Urinary Tract: Adrenal glands are unremarkable. Kidneys are normal in size, without obstructing renal calculi, focal lesion, or hydronephrosis. A 4 mm nonobstructing renal calculus is seen within the lower pole of the right  kidney. A 2 mm nonobstructing renal calculus is seen within the mid left kidney. Bladder is unremarkable. Stomach/Bowel: Stomach is within normal limits. Appendix appears normal. No evidence of bowel wall thickening, distention, or inflammatory changes. Noninflamed diverticula are seen within the proximal sigmoid colon. Vascular/Lymphatic: Aortic atherosclerosis. No enlarged abdominal or pelvic lymph nodes. Reproductive: Prostate is unremarkable. Other: No abdominal wall hernia or abnormality. No abdominopelvic ascites. Musculoskeletal: No acute or significant osseous findings. IMPRESSION: 1. Bilateral subcentimeter nonobstructing renal calculi. 2. Sigmoid diverticulosis. 3. Aortic atherosclerosis. Aortic Atherosclerosis (ICD10-I70.0). Electronically Signed   By: Virgina Norfolk M.D.   On: 01/31/2023 00:41    ED COURSE and MDM  Nursing notes, initial and subsequent vitals signs, including pulse oximetry, reviewed and interpreted by myself.  Vitals:   01/30/23 1912 01/30/23 1915  BP:  119/83  Pulse:  63  Resp:  18  Temp:  98.4 F (36.9 C)  TempSrc:  Oral  SpO2:  100%  Weight: 86.2 kg   Height: '5\' 7"'$  (1.702 m)    Medications - No data to display  12:46 AM CT scan shows multiple renal stones with no ureteral stone, hydroureter or hydronephrosis.  He does have blood in his urine microscopically and was having flank pain.  I suspect he passed a stone, hence his absence of pain since about 8 PM.  PROCEDURES  Procedures   ED DIAGNOSES     ICD-10-CM   1. Ureteral colic  X52          Casper Pagliuca, MD 01/31/23 [phone]

## 2023-01-30 NOTE — Patient Instructions (Addendum)
Medication Instructions:   We changed your medication list to reflect you take Amiodarone 100 mg (1/2 tablet) every OTHER day   Lasix was removed from your medication list  Labwork: CMET,MAGNESIUM,CBC,TSH TODAY   IN 8 WEEKS (03/31/23) FASTING LIPIDS AND LFT'S  Testing/Procedures: None today  Follow-Up: 4 months Dr.McDowell  Any Other Special Instructions Will Be Listed Below (If Applicable).  If you need a refill on your cardiac medications before your next appointment, please call your pharmacy.

## 2023-01-31 ENCOUNTER — Emergency Department (HOSPITAL_BASED_OUTPATIENT_CLINIC_OR_DEPARTMENT_OTHER): Payer: Medicare Other

## 2023-01-31 ENCOUNTER — Encounter: Payer: Self-pay | Admitting: Internal Medicine

## 2023-01-31 ENCOUNTER — Telehealth: Payer: Self-pay

## 2023-01-31 ENCOUNTER — Other Ambulatory Visit: Payer: Self-pay

## 2023-01-31 ENCOUNTER — Encounter (HOSPITAL_BASED_OUTPATIENT_CLINIC_OR_DEPARTMENT_OTHER): Payer: Self-pay | Admitting: Emergency Medicine

## 2023-01-31 DIAGNOSIS — I6523 Occlusion and stenosis of bilateral carotid arteries: Secondary | ICD-10-CM | POA: Diagnosis not present

## 2023-01-31 DIAGNOSIS — I2510 Atherosclerotic heart disease of native coronary artery without angina pectoris: Secondary | ICD-10-CM

## 2023-01-31 DIAGNOSIS — Z79899 Other long term (current) drug therapy: Secondary | ICD-10-CM

## 2023-01-31 DIAGNOSIS — N200 Calculus of kidney: Secondary | ICD-10-CM | POA: Diagnosis not present

## 2023-01-31 DIAGNOSIS — K5730 Diverticulosis of large intestine without perforation or abscess without bleeding: Secondary | ICD-10-CM | POA: Diagnosis not present

## 2023-01-31 DIAGNOSIS — Z8673 Personal history of transient ischemic attack (TIA), and cerebral infarction without residual deficits: Secondary | ICD-10-CM | POA: Diagnosis not present

## 2023-01-31 DIAGNOSIS — R200 Anesthesia of skin: Secondary | ICD-10-CM | POA: Diagnosis not present

## 2023-01-31 DIAGNOSIS — E785 Hyperlipidemia, unspecified: Secondary | ICD-10-CM

## 2023-01-31 MED ORDER — POTASSIUM CHLORIDE CRYS ER 20 MEQ PO TBCR: 20.0000 meq | EXTENDED_RELEASE_TABLET | Freq: Every day | ORAL | 3 refills | Status: DC

## 2023-01-31 NOTE — Telephone Encounter (Signed)
Results discussed with wife, will e-scribe potassium to [facility]   Wife states patient left our office yesterday and was treated in the ED for renal stones.

## 2023-01-31 NOTE — Telephone Encounter (Signed)
-----   Message from Charlie Pitter, [place] sent at 01/30/2023  5:50 PM EST ----- Please let pt know labs suggest he's a little on the dry side just by a smidge. Not on any meds that would precipitate this. Can increase water intake but not go overboard, no more than 64oz per day. Calcium and potassium level just below goal. Add KCl 86mq daily. Please increase dietary intake of healthy sources of potassium including bananas, squash, yogurt, white beans, sweet potatoes, leafy greens, and avocados. Recommend recheck BMET when he sees EP on 2/14.

## 2023-02-05 NOTE — Progress Notes (Signed)
Cardiology Office Note Date:  02/05/2023  Patient ID:  Jeffrey Allen [birth year], MRN [id number] PCP:  B[place]ville Nation, MD  Cardiologist:  Dr. Domenic Polite Electrophysiologist: Dr. Curt Bears    Chief Complaint:  *** 7mo History of Present Illness: Jeffrey BALILESis a 67y.o. male with history of CAD (s/p cath on 05/26/2022 showing severe 3-vessel CAD with DESx1 to mid/distal LAD, DESx1 to D1 and DESx2 to distal LCx), PVCs, HTN, HLD, stroke.  [facility], dizziness, HR 30's with frequent PVCs, started on acebutalol >> amiodarone.  LVEF 45-50% suspect 2/2 PVCs.  Referred to Dr. CCurt Bears saw him 09/12/22, he was feeling more SOB, no cardiac awareness/PVCs, remained on BID amiodarone and reduced to 1028mdaily dosing. Planned to f/u with EP APP in a month, if not feeling better would stop amio and consider mexiletine. Another option would be an ablation though would need to stop meds in prep for that.   I saw him 10/12/22 He is accompanied by his wife. He is a little tangential though ultimately feels better. He is no longer aware of any PVCs.  Previously only aware of them mostly at night when resting his arms on his chest could feel the extra/skipped beat No SOB He works in [employer] has been able to do the heavy work and feels like he has good exertional cpacity His wife mentions heaviness in his chest that he reports a few times a week.  He reports this is when they are bagging corn and he is holding the bag, or when seated at night feels the ache/heaviness across his upper abdomen, getting up./stretching helps it. No CP with physical work or otherwise. No near syncope or syncope. He worries about being on so much medicine, worries about interaction. Thinks one of them gives him a flushed feeling in his face and a bad takes it his mouth. His wife brings up that he worries abut his care/condition a lot, listens to  friends/acquaintances that all know people who's heart problems were treated differently, or how bad off he must be because he had so many stents done. No PVCs on exam or EKG He wanted to come off amiodarone, planned for an echo and monitor to reassess clinically Offered referral    to Dr. ScMichail Sermonor his anxiety, he declined.  Pt reached out via my chart 01/27/23, with reports of bradycardic rates, wife concerns about symptoms. He had self reduced his amiodarone to QOD with perhaps other medication non-compliances as well.  Symptoms somewhat vague, she feared he down-played them.  He saw D. Dunn, PA-C 01/30/23, pt/wife reported fatigue after amiodarone and they had reduced to QOD, he was not taking lasix or praluent either, taking other meds as prescribed Offered repeat monitoring, opted to hold off until his EP f/u, planned for labs. The pt did not want to add any further medications, limiting ability to advance GDMT. Not on BB 2/2 bradycardia Willing to retry Praluent  EKG has no PVCs, HR 81bpm  Same day ER visit 01/30/23 c/o R flank pain, CT scan showed multiple renal stones with no ureteral stone, hydroureter or hydronephrosis, microscopic blood in his urine with suspicion he had passed a stone, pain resolved and discharged from the ER.  TTE with LVEF stable at 45-50% Monitor with SR, 2.4% PVC burden   *** In Nov was he taking amio QOD when he wore the monitor? *** symptoms, brady, otherwise *** medication  fears *** PMD, ? Psych referral *** PVCs?  AAD hx Amiodarone started Jun 2023 for PVCs  Past Medical History:  Diagnosis Date   Bradycardia    CAD (coronary artery disease)    a. 05/2022: DES placement x1 to the mid/distal LAD, DES placement x1 to D1 and DES placement x2 to the distal LCx   GERD (gastroesophageal reflux disease)    Gout    Heart failure with mildly reduced ejection fraction (HFmrEF) (HCC)    High cholesterol    NSVT (nonsustained ventricular tachycardia)  (HCC)    Obesity (BMI 30.0-34.9)    Peptic ulcer disease    Prediabetes    PVC's (premature ventricular contractions)    Stroke ([place]) 06/2017   SVT (supraventricular tachycardia)    Ureterolithiasis     Past Surgical History:  Procedure Laterality Date   BIOPSY  11/25/2020   Procedure: BIOPSY;  Surgeon: Daneil Dolin, MD;  [facility];  Service: Endoscopy;;  gastric   COLONOSCOPY     COLONOSCOPY N/A 12/07/2016   Scattered small and large-mouthed diverticula in sigmoid and descending colon. Six semi-pedunculated polyps in sigmoid and ascending colon, 4-6 mm in size. Distal TI normal appearing. Hyperplastic and tubular adenomas. 3 year surveillance recommended.    COLONOSCOPY N/A 11/25/2020   diverticulosis in sigmoid and descending colon, three 3-4 mm polyps at rectosigmoid colon and hepatic flexure. Hyperplastic polyp. 5 year colonoscopy.    CORONARY STENT INTERVENTION N/A 05/26/2022   Procedure: CORONARY STENT INTERVENTION;  Surgeon: Burnell Blanks, MD;  Location: [facility];  Service: Cardiovascular;  Laterality: N/A;   ESOPHAGOGASTRODUODENOSCOPY N/A 11/25/2020     normal esophagus, portal gastropathy, normal duodenum. Mild chronic gastritis, negative H.pylori.    LEFT HEART CATH AND CORS/GRAFTS ANGIOGRAPHY N/A 05/26/2022   Procedure: LEFT HEART CATH AND CORS/GRAFTS ANGIOGRAPHY;  Surgeon: Burnell Blanks, MD;  Location: [facility];  Service: Cardiovascular;  Laterality: N/A;   POLYPECTOMY  11/25/2020   Procedure: POLYPECTOMY;  Surgeon: Daneil Dolin, MD;  [facility];  Service: Endoscopy;;   TOOTH EXTRACTION      Current Outpatient Medications  Medication Sig Dispense Refill   Alirocumab (PRALUENT) 150 MG/ML SOAJ Inject 1 mL into the skin every 14 (fourteen) days. 2 mL 11   allopurinol (ZYLOPRIM) 300 MG tablet Take 0.5 tablets (150 mg total) by mouth daily. 30 tablet 1   amiodarone (PACERONE) 200 MG tablet Take 1/2  tablet (100 mg) every OTHER day 45 tablet 3   Ascorbic Acid (VITAMIN C PO) Take 1 tablet by mouth daily.     aspirin EC 81 MG tablet Take 1 tablet (81 mg total) by mouth daily with breakfast. 30 tablet 12   Cholecalciferol (VITAMIN D3) 125 MCG (5000 UT) TABS Take 10,000 Units by mouth daily.      clopidogrel (PLAVIX) 75 MG tablet Take 1 tablet (75 mg total) by mouth daily. 90 tablet 1   co-enzyme Q-10 30 MG capsule Take 100 mg by mouth 3 (three) times daily.     ezetimibe (ZETIA) 10 MG tablet TAKE (1) TABLET BY MOUTH ONCE DAILY. 30 tablet 5   gabapentin (NEURONTIN) 100 MG capsule TAKE 1 CAPSULE BY MOUTH THREE TIMES A DAY. (Patient taking differently: Take 100 mg by mouth daily.) 90 capsule 0   Garlic 123XX123 MG CAPS Take 1,000 mg by mouth daily.     losartan (COZAAR) 25 MG tablet Take 1 tablet (25 mg total) by mouth daily. [place]  tablet 1   nitroGLYCERIN (NITROSTAT) 0.4 MG SL tablet Place 1 tablet (0.4 mg total) under the tongue every 5 (five) minutes as needed for chest pain. 25 tablet 2   pantoprazole (PROTONIX) 40 MG tablet Take 1 tablet (40 mg total) by mouth daily. 90 tablet 1   potassium chloride SA (KLOR-CON M20) 20 MEQ tablet Take 1 tablet (20 mEq total) by mouth daily. 90 tablet 3   Red Yeast Rice Extract (RED YEAST RICE PO) Take 2 tablets by mouth daily.     vitamin B-12 (CYANOCOBALAMIN) 1000 MCG tablet Take 1,000 mcg by mouth daily.     No current facility-administered medications for this visit.    Allergies:   Prednisone and Statins   Social History:  The patient  reports that he quit smoking about 16 years ago. His smoking use included cigarettes. He has a 0.50 pack-year smoking history. He has never used smokeless tobacco. He reports that he does not currently use alcohol. He reports that he does not currently use drugs after having used the following drugs: Marijuana.   Family History:  The patient's family history includes COPD in his father; Dementia in his mother; Diabetes in his  father; Heart attack in his father; Hyperlipidemia in his father; Hypertension in his father.  ROS:  Please see the history of present illness.    All other systems are reviewed and otherwise negative.   PHYSICAL EXAM:  VS:  There were no vitals taken for this visit. BMI: There is no height or weight on file to calculate BMI. Well nourished, well developed, in no acute distress HEENT: normocephalic, atraumatic Neck: no JVD, carotid bruits or masses Cardiac:  RRR; no significant murmurs, no rubs, or gallops Lungs:  CTA b/l, no wheezing, rhonchi or rales Abd: soft, nontender MS: no deformity or atrophy Ext: no edema Skin: warm and dry, no rash Neuro:  No gross deficits appreciated Psych: euthymic mood, full affect   EKG:  not done today 01/30/23, personally reviewed was SR 81bpm, 1st degree AVblock 2107m  Nov 2023, monitor Predominant rhythm was sinus rhythm Less than 1% supraventricular ectopy burden 2.4% ventricular ectopy burden No triggered episodes recorded  10/28/22: TTE 1. Left ventricular ejection fraction, by estimation, is 45 to 50%. The  left ventricle has mildly decreased function. The left ventricle  demonstrates global hypokinesis. Left ventricular diastolic parameters are  indeterminate.   2. Right ventricular systolic function is normal. The right ventricular  size is normal.   3. Left atrial size was mildly dilated.   4. The mitral valve is grossly normal. Trivial mitral valve  regurgitation.   5. The aortic valve is tricuspid. Aortic valve regurgitation is trivial.   Comparison(s): No significant change from prior study.    LHC: 05/2022  Ost RCA to Prox RCA lesion is 50% stenosed.   Mid Cx lesion is 99% stenosed.   Prox Cx lesion is 70% stenosed.   1st Diag lesion is 80% stenosed.   Dist LAD lesion is 90% stenosed.   A drug-eluting stent was successfully placed using a STENT ONYX FRONTIER 2.25X18.   A drug-eluting stent was successfully placed using a  STENT ONYX FRONTIER 3.0X12.   A drug-eluting stent was successfully placed using a SYNERGY XD 2.25X16.   A drug-eluting stent was successfully placed using a SYNERGY XD 2.25X12.   Post intervention, there is a 0% residual stenosis.   Post intervention, there is a 0% residual stenosis.   Post intervention, there is a 0%  residual stenosis.   Post intervention, there is a 0% residual stenosis.   Severe mid to distal LAD stenosis. Successful PTCA/DES x 1 mid to distal LAD Severe Diagonal 1 stenosis. Successful PTCA/DES x 1 Diagonal Severe distal Circumflex stenosis. Successful PTCA/DES x 2 distal Circumflex Moderate ostial RCA stenosis. Large dominant RCA   Recommendations: Continue DAPT with ASA and Brilinta for one year. Continue statin and beta blocker.    Cardiac Telemetry: 05/2022 ZIO AT reviewed.  11 days, 2 hours analyzed. 1.  Predominant rhythm is sinus with prolonged PR interval, heart rate ranging from 36 bpm up to 122 bpm with average heart rate 71 bpm. 2.  There were occasional PACs representing 2.7% total beats with otherwise rare atrial couplets and triplets representing less than 1% total beats. 3.  There were frequent PVCs representing 17.1% total beats with occasional ventricular couplets representing 4.8% total beats, and otherwise rare ventricular triplets representing less than 1% total beats.  Ventricular bigeminy and trigeminy were also noted. 4.  Multiple episodes of NSVT were noted.  Longest episode was described at 22 seconds, however looks to be more of a combination of fusion beats with more brief episodes of NSVT, rather than a sustained event. 5.  There were 10 episodes of PSVT, the longest of which was 16 beats. 6.  Patient triggered episodes loosely correlated with PVCs.   Limited Echo: 06/23/2022 IMPRESSIONS   1. Limited echo for LVEF   2. Left ventricular ejection fraction, by estimation, is 45 to 50%. Left  ventricular ejection fraction by 2D MOD biplane is  47.4 %. The left  ventricle has mildly decreased function. The left ventricle demonstrates  global hypokinesis.   3. The inferior vena cava is normal in size with greater than 50%  respiratory variability, suggesting right atrial pressure of 3 mmHg.   Comparison(s): Changes from prior study are noted. 05/26/2022: LVEF 50-55%.   Recent Labs: 06/23/2022: B Natriuretic Peptide 293.0 01/30/2023: ALT 16; BUN 25; Creatinine, Ser 0.88; Hemoglobin 15.3; Magnesium 2.3; Platelets 181; Potassium 3.7; Sodium 141; TSH 1.244  07/27/2022: VLDL 36 01/04/2023: Chol/HDL Ratio 3.7; Cholesterol, Total 230; HDL 63; LDL Chol Calc (NIH) 144; Triglycerides 131   Estimated Creatinine Clearance: 88.9 mL/min (by C-G formula based on SCr of 0.88 mg/dL).   Wt Readings from Last 3 Encounters:  01/30/23 190 lb (86.2 kg)  01/30/23 196 lb (88.9 kg)  10/12/22 206 lb 9.6 oz (93.7 kg)     Other studies reviewed: Additional studies/records reviewed today include: summarized above  ASSESSMENT AND PLAN:  PVCs Amiodarone Labs today No PVS on exam or EKG again today He is feling better but worries about amio side effects Will plan for echo and monitor to re-evaluate his PVC burden and EF  He would like to come off amio and monitor, if his EF and burden are better and had recurrent PVCs off drug, would consider ablation if still an option.  Will continue amio until after testing  HFmrEF Chronic CHF Suspect 2/2 PVCs Stable LVEF, 45-50% *** No symptoms or exam findings of volume OL *** Pt fears of medications/potential side effects and un agreeable to advance GDMT  HTN *** Looks ok  CAD *** No anginal sounding symptoms  On ASA, plavix, statin/zetia /w Dr. Domenic Polite  6. ?anxiety *** Offered Dr. Michail Sermon referral, he does not want to Encouraged d/w his PMD    Disposition: ***  Current medicines are reviewed at length with the patient today.  The patient did not have  any concerns regarding  medicines.  Venetia Night, PA-C 02/05/2023 3:48 PM     [address] [place] [address] [phone] (office)  [phone] (fax)

## 2023-02-08 ENCOUNTER — Ambulatory Visit: Payer: Medicare Other | Attending: Physician Assistant | Admitting: Physician Assistant

## 2023-02-08 ENCOUNTER — Telehealth: Payer: Self-pay | Admitting: Physician Assistant

## 2023-02-08 ENCOUNTER — Encounter: Payer: Self-pay | Admitting: Physician Assistant

## 2023-02-08 VITALS — BP 110/76 | HR 70 | Ht 67.0 in | Wt 194.6 lb

## 2023-02-08 DIAGNOSIS — I10 Essential (primary) hypertension: Secondary | ICD-10-CM

## 2023-02-08 DIAGNOSIS — I2510 Atherosclerotic heart disease of native coronary artery without angina pectoris: Secondary | ICD-10-CM | POA: Diagnosis not present

## 2023-02-08 DIAGNOSIS — I493 Ventricular premature depolarization: Secondary | ICD-10-CM | POA: Diagnosis not present

## 2023-02-08 DIAGNOSIS — I5022 Chronic systolic (congestive) heart failure: Secondary | ICD-10-CM | POA: Diagnosis not present

## 2023-02-08 DIAGNOSIS — Z79899 Other long term (current) drug therapy: Secondary | ICD-10-CM

## 2023-02-08 NOTE — Telephone Encounter (Signed)
Spoke to patients wife who stated pt will have lab drawn on 2/15 @ [facility].

## 2023-02-08 NOTE — Patient Instructions (Signed)
Medication Instructions:    Your physician recommends that you continue on your current medications as directed. Please refer to the Current Medication list given to you today.  *If you need a refill on your cardiac medications before your next appointment, please call your pharmacy*   Lab Work: [place]   If you have labs (blood work) drawn today and your tests are completely normal, you will receive your results only by: [place] (if you have MyChart) OR A paper copy in the mail If you have any lab test that is abnormal or we need to change your treatment, we will call you to review the results.   Testing/Procedures: NONE ORDERED  TODAY    Follow-Up: At [facility], you and your health needs are our priority.  As part of our continuing mission to provide you with exceptional heart care, we have created designated Provider Care Teams.  These Care Teams include your primary Cardiologist (physician) and Advanced Practice Providers (APPs -  Physician Assistants and Nurse Practitioners) who all work together to provide you with the care you need, when you need it.  We recommend signing up for the patient portal called "MyChart".  Sign up information is provided on this After Visit Summary.  MyChart is used to connect with patients for Virtual Visits (Telemedicine).  Patients are able to view lab/test results, encounter notes, upcoming appointments, etc.  Non-urgent messages can be sent to your provider as well.   To learn more about what you can do with MyChart, go to [email or web address].    Your next appointment:   6 month(s)  Provider:   Tommye Standard, PA-C    Other Instructions

## 2023-02-08 NOTE — Telephone Encounter (Signed)
Per last phone note 2/6, plan was to add KCl 2mq daily [facility] Do not see labs drawn so please recommend he get BMET at his convenience. Thank you!

## 2023-02-09 ENCOUNTER — Telehealth: Payer: Self-pay | Admitting: Cardiology

## 2023-02-09 ENCOUNTER — Other Ambulatory Visit (HOSPITAL_COMMUNITY)
Admission: RE | Admit: 2023-02-09 | Discharge: 2023-02-09 | Disposition: A | Discharge status: Home / Self Care | Payer: Medicare Other | Source: Ambulatory Visit | Attending: Physician Assistant | Admitting: Physician Assistant

## 2023-02-09 DIAGNOSIS — I2510 Atherosclerotic heart disease of native coronary artery without angina pectoris: Secondary | ICD-10-CM | POA: Diagnosis not present

## 2023-02-09 DIAGNOSIS — Z79899 Other long term (current) drug therapy: Secondary | ICD-10-CM | POA: Insufficient documentation

## 2023-02-09 DIAGNOSIS — E785 Hyperlipidemia, unspecified: Secondary | ICD-10-CM | POA: Insufficient documentation

## 2023-02-09 LAB — BASIC METABOLIC PANEL
Anion gap: 9 (ref 5–15)
BUN: 31 mg/dL — ABNORMAL HIGH (ref 8–23)
CO2: 26 mmol/L (ref 22–32)
Calcium: 9.2 mg/dL (ref 8.9–10.3)
Chloride: 103 mmol/L (ref 98–111)
Creatinine, Ser: 0.96 mg/dL (ref 0.61–1.24)
GFR, Estimated: >60 mL/min (ref >60)
Glucose, Bld: 112 mg/dL — ABNORMAL HIGH (ref 70–99)
Potassium: 3.9 mmol/L (ref 3.5–5.1)
Sodium: 138 mmol/L (ref 135–145)

## 2023-02-09 LAB — HEPATIC FUNCTION PANEL
ALT: 23 U/L (ref 0–44)
AST: 23 U/L (ref 15–41)
Albumin: 4.3 g/dL (ref 3.5–5.0)
Alkaline Phosphatase: 59 U/L (ref 38–126)
Bilirubin, Direct: 0.1 mg/dL (ref 0.0–0.2)
Indirect Bilirubin: 0.5 mg/dL (ref 0.3–0.9)
Total Bilirubin: 0.6 mg/dL (ref 0.3–1.2)
Total Protein: 7.7 g/dL (ref 6.5–8.1)

## 2023-02-09 LAB — LIPID PANEL
Cholesterol: 208 mg/dL — ABNORMAL HIGH (ref 0–200)
HDL: 54 mg/dL (ref >40)
LDL Cholesterol: 123 mg/dL — ABNORMAL HIGH (ref 0–99)
Total CHOL/HDL Ratio: 3.9 RATIO
Triglycerides: 154 mg/dL — ABNORMAL HIGH (ref <150)
VLDL: 31 mg/dL (ref 0–40)

## 2023-02-09 NOTE — Telephone Encounter (Signed)
Patient's wife is requesting return call to discuss update on medications that was supposed to be called in and also to find out if upcoming lab appt will be far enough out after starting meds. Please advise.

## 2023-02-09 NOTE — Telephone Encounter (Signed)
Returned call to wife. No answer. Left msg to call back.

## 2023-02-13 ENCOUNTER — Telehealth: Payer: Self-pay

## 2023-02-13 DIAGNOSIS — Z79899 Other long term (current) drug therapy: Secondary | ICD-10-CM

## 2023-02-13 MED ORDER — POTASSIUM CHLORIDE CRYS ER 20 MEQ PO TBCR: 40.0000 meq | EXTENDED_RELEASE_TABLET | Freq: Every day | ORAL | 3 refills | Status: DC

## 2023-02-13 NOTE — Telephone Encounter (Signed)
Spoke to pt's wife and verbalized results. See previous note.

## 2023-02-13 NOTE — Telephone Encounter (Signed)
Patients wife notified and verbalized understanding. Patients wife had no questions or concerns at this time. PCP copied.

## 2023-02-13 NOTE — Telephone Encounter (Signed)
-----   Message from Charlie Pitter, [place] sent at 02/09/2023  9:55 AM EST ----- Please let pt know potassium better but not quite yet at goal given h/o PVCs. Would increase potassium supplement to 35m daily and recheck BMET in 1 week. His fluid markers look a little dry too - make sure drinking enough water but not to excess.   He was not supposed to have the LFTs/fasting lipid drawn yet. That was for 8 weeks from now. [facility] I do not want him charged for this - there has to be a better way that the lab can rectify this to avoid drawing future labs whenever a patient comes in for current labs. Thank you.

## 2023-02-16 NOTE — Telephone Encounter (Signed)
Per Melina Copa note, patient was not on Praluent at the time of labs. She is repeating labs in 8 weeks

## 2023-02-17 ENCOUNTER — Emergency Department (HOSPITAL_COMMUNITY): Payer: Medicare Other

## 2023-02-17 ENCOUNTER — Ambulatory Visit: Payer: Medicare Other

## 2023-02-17 ENCOUNTER — Other Ambulatory Visit: Payer: Self-pay

## 2023-02-17 ENCOUNTER — Encounter (HOSPITAL_COMMUNITY): Payer: Self-pay

## 2023-02-17 ENCOUNTER — Emergency Department (HOSPITAL_COMMUNITY)
Admission: EM | Admit: 2023-02-17 | Discharge: 2023-02-17 | Disposition: A | Discharge status: Home / Self Care | Payer: Medicare Other | Attending: Emergency Medicine | Admitting: Emergency Medicine

## 2023-02-17 DIAGNOSIS — I5020 Unspecified systolic (congestive) heart failure: Secondary | ICD-10-CM | POA: Diagnosis not present

## 2023-02-17 DIAGNOSIS — Z8673 Personal history of transient ischemic attack (TIA), and cerebral infarction without residual deficits: Secondary | ICD-10-CM | POA: Diagnosis not present

## 2023-02-17 DIAGNOSIS — N200 Calculus of kidney: Secondary | ICD-10-CM | POA: Diagnosis not present

## 2023-02-17 DIAGNOSIS — Z7982 Long term (current) use of aspirin: Secondary | ICD-10-CM | POA: Diagnosis not present

## 2023-02-17 DIAGNOSIS — Z79899 Other long term (current) drug therapy: Secondary | ICD-10-CM | POA: Insufficient documentation

## 2023-02-17 DIAGNOSIS — I2510 Atherosclerotic heart disease of native coronary artery without angina pectoris: Secondary | ICD-10-CM | POA: Insufficient documentation

## 2023-02-17 DIAGNOSIS — R109 Unspecified abdominal pain: Secondary | ICD-10-CM | POA: Diagnosis not present

## 2023-02-17 LAB — URINALYSIS, ROUTINE W REFLEX MICROSCOPIC
Bilirubin Urine: NEGATIVE
Glucose, UA: NEGATIVE mg/dL
Ketones, ur: 20 mg/dL — AB
Leukocytes,Ua: NEGATIVE
Nitrite: NEGATIVE
Protein, ur: 30 mg/dL — AB
RBC / HPF: >50 RBC/hpf (ref 0–5)
Specific Gravity, Urine: 1.021 (ref 1.005–1.030)
pH: 5 (ref 5.0–8.0)

## 2023-02-17 LAB — CBC WITH DIFFERENTIAL/PLATELET
Abs Immature Granulocytes: 0.07 10*3/uL (ref 0.00–0.07)
Basophils Absolute: 0 10*3/uL (ref 0.0–0.1)
Basophils Relative: 0 %
Eosinophils Absolute: 0 10*3/uL (ref 0.0–0.5)
Eosinophils Relative: 0 %
HCT: 45.5 % (ref 39.0–52.0)
Hemoglobin: 15.8 g/dL (ref 13.0–17.0)
Immature Granulocytes: 1 %
Lymphocytes Relative: 7 %
Lymphs Abs: 0.9 10*3/uL (ref 0.7–4.0)
MCH: 31.4 pg (ref 26.0–34.0)
MCHC: 34.7 g/dL (ref 30.0–36.0)
MCV: 90.5 fL (ref 80.0–100.0)
Monocytes Absolute: 0.7 10*3/uL (ref 0.1–1.0)
Monocytes Relative: 5 %
Neutro Abs: 11.1 10*3/uL — ABNORMAL HIGH (ref 1.7–7.7)
Neutrophils Relative %: 87 %
Platelets: 177 10*3/uL (ref 150–400)
RBC: 5.03 MIL/uL (ref 4.22–5.81)
RDW: 12.7 % (ref 11.5–15.5)
WBC: 12.8 10*3/uL — ABNORMAL HIGH (ref 4.0–10.5)
nRBC: 0 % (ref 0.0–0.2)

## 2023-02-17 LAB — COMPREHENSIVE METABOLIC PANEL
ALT: 22 U/L (ref 0–44)
AST: 24 U/L (ref 15–41)
Albumin: 4.4 g/dL (ref 3.5–5.0)
Alkaline Phosphatase: 51 U/L (ref 38–126)
Anion gap: 14 (ref 5–15)
BUN: 28 mg/dL — ABNORMAL HIGH (ref 8–23)
CO2: 25 mmol/L (ref 22–32)
Calcium: 9.1 mg/dL (ref 8.9–10.3)
Chloride: 100 mmol/L (ref 98–111)
Creatinine, Ser: 1.08 mg/dL (ref 0.61–1.24)
GFR, Estimated: >60 mL/min (ref >60)
Glucose, Bld: 126 mg/dL — ABNORMAL HIGH (ref 70–99)
Potassium: 3.3 mmol/L — ABNORMAL LOW (ref 3.5–5.1)
Sodium: 139 mmol/L (ref 135–145)
Total Bilirubin: 1.1 mg/dL (ref 0.3–1.2)
Total Protein: 7.1 g/dL (ref 6.5–8.1)

## 2023-02-17 LAB — LIPASE, BLOOD: Lipase: 28 U/L (ref 11–51)

## 2023-02-17 MED ORDER — TAMSULOSIN HCL 0.4 MG PO CAPS: 0.4000 mg | ORAL_CAPSULE | Freq: Every day | ORAL | 0 refills | Status: DC

## 2023-02-17 MED ORDER — OXYCODONE-ACETAMINOPHEN 5-325 MG PO TABS: 1.0000 | ORAL_TABLET | Freq: Three times a day (TID) | ORAL | 0 refills | Status: DC | PRN

## 2023-02-17 MED ORDER — KETOROLAC TROMETHAMINE 15 MG/ML IJ SOLN
15.0000 mg | Freq: Once | INTRAMUSCULAR | Status: AC
  Administered 2023-02-17: 15 mg via INTRAVENOUS
  Filled 2023-02-17: qty 1

## 2023-02-17 MED ORDER — ONDANSETRON HCL 4 MG/2ML IJ SOLN
4.0000 mg | Freq: Once | INTRAMUSCULAR | Status: AC
  Administered 2023-02-17: 4 mg via INTRAVENOUS
  Filled 2023-02-17: qty 2

## 2023-02-17 MED ORDER — HYDROMORPHONE HCL 1 MG/ML IJ SOLN
1.0000 mg | Freq: Once | INTRAMUSCULAR | Status: AC
  Administered 2023-02-17: 1 mg via INTRAVENOUS
  Filled 2023-02-17: qty 1

## 2023-02-17 NOTE — ED Triage Notes (Signed)
Complaining of rt sided flank pain, started today around lunch, is making him nauseated.

## 2023-02-17 NOTE — ED Provider Notes (Signed)
[facility] Provider Note   CSN: [id number] Arrival date & time: 02/17/23  1700     History  Chief Complaint  Patient presents with   Abdominal Pain    Jeffrey Allen is a 67 y.o. male.   Abdominal Pain Patient presents with severe abdominal pain.  Began earlier today.  Right flank pain but also across the upper abdomen.  Is been vomiting.  Had had similar pain about 2 weeks ago with a negative CT scan at that time although did have renal stones.  And doing well until today.  States urine has been a little darker.    Past Medical History:  Diagnosis Date   Bradycardia    CAD (coronary artery disease)    a. 05/2022: DES placement x1 to the mid/distal LAD, DES placement x1 to D1 and DES placement x2 to the distal LCx   GERD (gastroesophageal reflux disease)    Gout    Heart failure with mildly reduced ejection fraction (HFmrEF) (HCC)    High cholesterol    NSVT (nonsustained ventricular tachycardia) (HCC)    Obesity (BMI 30.0-34.9)    Peptic ulcer disease    Prediabetes    PVC's (premature ventricular contractions)    Stroke ([place]) 06/2017   SVT (supraventricular tachycardia)    Ureterolithiasis     Home Medications Prior to Admission medications   Medication Sig Start Date End Date Taking? Authorizing Provider  oxyCODONE-acetaminophen (PERCOCET/ROXICET) 5-325 MG tablet Take 1-2 tablets by mouth every 8 (eight) hours as needed for severe pain. 02/17/23  Yes Davonna Belling, MD  tamsulosin (FLOMAX) 0.4 MG CAPS capsule Take 1 capsule (0.4 mg total) by mouth daily. 02/17/23  Yes Davonna Belling, MD  Alirocumab (PRALUENT) 150 MG/ML SOAJ Inject 1 mL into the skin every 14 (fourteen) days. Patient not taking: Reported on 02/08/2023 10/12/22   [name], MD  allopurinol (ZYLOPRIM) 300 MG tablet Take 0.5 tablets (150 mg total) by mouth daily. 05/18/21   Doree Albee, MD  amiodarone (PACERONE) 200 MG tablet Take 1/2  tablet (100 mg) every OTHER day 01/30/23   Charlie Pitter, PA-C  Ascorbic Acid (VITAMIN C PO) Take 1 tablet by mouth daily.    [provider]  aspirin EC 81 MG tablet Take 1 tablet (81 mg total) by mouth daily with breakfast. 06/25/22   Denton Brick, Courage, MD  Cholecalciferol (VITAMIN D3) 125 MCG (5000 UT) TABS Take 10,000 Units by mouth daily.     [provider]  clopidogrel (PLAVIX) 75 MG tablet Take 1 tablet (75 mg total) by mouth daily. 01/06/23   [name], MD  co-enzyme Q-10 30 MG capsule Take 100 mg by mouth 3 (three) times daily.    [provider]  ezetimibe (ZETIA) 10 MG tablet TAKE (1) TABLET BY MOUTH ONCE DAILY. 07/18/22   [name], MD  gabapentin (NEURONTIN) 100 MG capsule TAKE 1 CAPSULE BY MOUTH THREE TIMES A DAY. Patient taking differently: Take 100 mg by mouth as needed. 09/22/21   Lindell Spar, MD  Garlic [id number] MG CAPS Take 1,000 mg by mouth daily.    [provider]  losartan (COZAAR) 25 MG tablet Take 1 tablet (25 mg total) by mouth daily. 07/19/22   Strader, Fransisco Hertz, PA-C  nitroGLYCERIN (NITROSTAT) 0.4 MG SL tablet Place 1 tablet (0.4 mg total) under the tongue every 5 (five) minutes as needed for chest pain. 05/27/22   Cheryln Manly, NP  pantoprazole (PROTONIX) 40 MG tablet Take 1 tablet (40 mg total) by mouth daily. 07/14/21   Hurshel Party C, MD  potassium chloride SA (KLOR-CON M) 20 MEQ tablet Take 2 tablets (40 mEq total) by mouth daily. 02/13/23   Dunn, Nedra Hai, PA-C  Red Yeast Rice Extract (RED YEAST RICE PO) Take 2 tablets by mouth daily.    [provider]  vitamin B-12 (CYANOCOBALAMIN) 1000 MCG tablet Take 1,000 mcg by mouth daily.    [provider]      Allergies    Prednisone and Statins    Review of Systems   Review of Systems  Gastrointestinal:  Positive for abdominal pain.    Physical Exam Updated Vital Signs BP (!) 141/96 (BP Location: Right Arm)   Pulse 66   Temp (!) 97.5 F  (36.4 C) (Oral)   Resp 18   Ht '5\' 8"'$  (1.727 m)   Wt 85.3 kg   SpO2 97%   BMI 28.59 kg/m  Physical Exam Vitals reviewed.  Constitutional:      Comments: Patient sitting in movable chair.  Hunched over to the side.  Somewhat tearful.  Pulmonary:     Breath sounds: Normal breath sounds.  Abdominal:     Comments: Diffuse tenderness but unable to lay back for better examination at this time..  May have some CVA tenderness on lower right side.  Skin:    General: Skin is warm.     Capillary Refill: Capillary refill takes less than 2 seconds.  Neurological:     Mental Status: He is alert and oriented to person, place, and time.     ED Results / Procedures / Treatments   Labs (all labs ordered are listed, but only abnormal results are displayed) Labs Reviewed  COMPREHENSIVE METABOLIC PANEL - Abnormal; Notable for the following components:      Result Value   Potassium 3.3 (*)    Glucose, Bld 126 (*)    BUN 28 (*)    All other components within normal limits  CBC WITH DIFFERENTIAL/PLATELET - Abnormal; Notable for the following components:   WBC 12.8 (*)    Neutro Abs 11.1 (*)    All other components within normal limits  URINALYSIS, ROUTINE W REFLEX MICROSCOPIC - Abnormal; Notable for the following components:   APPearance HAZY (*)    Hgb urine dipstick LARGE (*)    Ketones, ur 20 (*)    Protein, ur 30 (*)    Bacteria, UA RARE (*)    All other components within normal limits  LIPASE, BLOOD    EKG None  Radiology DG Abdomen 1 View  Result Date: 02/17/2023 CLINICAL DATA:  RIGHT-sided flank pain that began today. EXAM: ABDOMEN - 1 VIEW COMPARISON:  Pelvis July 23, 2006, CT renal stone protocol performed on January 31, 2023. FINDINGS: No signs of bowel obstruction. Stool and gas overlie the RIGHT renal contour. Subtle calcific density seen in the area of the RIGHT L2-3 transverse process level perhaps measuring as much as 6 mm. Spinal degenerative changes. No acute bone  abnormality to the extent evaluated. IMPRESSION: Subtle calcific density seen in the area of the RIGHT L2-3 transverse process level perhaps measuring as much as 6 mm. This could represent a ureteral calculus particularly given there was a small calculus seen on previous abdominal CT from February 6. CT may be helpful for further evaluation. Electronically Signed   By: Zetta Bills M.D.   On: 02/17/2023 17:54    Procedures Procedures  Medications Ordered in ED Medications  HYDROmorphone (DILAUDID) injection 1 mg (1 mg Intravenous Given 02/17/23 1815)  ondansetron (ZOFRAN) injection 4 mg (4 mg Intravenous Given 02/17/23 1814)  ketorolac (TORADOL) 15 MG/ML injection 15 mg (15 mg Intravenous Given 02/17/23 2032)    ED Course/ Medical Decision Making/ A&P                             Medical Decision Making Amount and/or Complexity of Data Reviewed Labs: ordered. Radiology: ordered.  Risk Prescription drug management.   Patient abdominal pain flank pain.  Severe.  Began somewhat quickly.  Previous history of stones but also had CT scan about 2 weeks ago that did not show ureteral stones.  Will get basic blood work and treat symptomatic with pain medicines.  Will get KUB to start with.  Differential diagnose includes kidney stones, other intra-abdominal pathology such as bili disease, obstruction.  Blood work reassuring.  KUB shows potential kidney stone.  Urine does show some hematuria.  Patient feels much better after pain medicine.  I think most likely this is a kidney stone.  Do not feel we need to reimage at this time with recent known renal stones.  Will give pain medicine and Flomax for home.  Urology follow-up.  No infection.  Discharge home.        Final Clinical Impression(s) / ED Diagnoses Final diagnoses:  Kidney stone    Rx / DC Orders ED Discharge Orders          Ordered    oxyCODONE-acetaminophen (PERCOCET/ROXICET) 5-325 MG tablet  Every 8 hours PRN         02/17/23 2040    tamsulosin (FLOMAX) 0.4 MG CAPS capsule  Daily        02/17/23 2040              Davonna Belling, MD 02/17/23 2041

## 2023-02-18 ENCOUNTER — Emergency Department (HOSPITAL_COMMUNITY): Payer: Medicare Other

## 2023-02-18 ENCOUNTER — Emergency Department (HOSPITAL_COMMUNITY)
Admission: EM | Admit: 2023-02-18 | Discharge: 2023-02-19 | Disposition: A | Discharge status: Home / Self Care | Payer: Medicare Other | Attending: Student | Admitting: Student

## 2023-02-18 ENCOUNTER — Encounter (HOSPITAL_COMMUNITY): Payer: Self-pay

## 2023-02-18 DIAGNOSIS — I110 Hypertensive heart disease with heart failure: Secondary | ICD-10-CM | POA: Insufficient documentation

## 2023-02-18 DIAGNOSIS — N200 Calculus of kidney: Secondary | ICD-10-CM

## 2023-02-18 DIAGNOSIS — I10 Essential (primary) hypertension: Secondary | ICD-10-CM | POA: Diagnosis not present

## 2023-02-18 DIAGNOSIS — Z955 Presence of coronary angioplasty implant and graft: Secondary | ICD-10-CM | POA: Insufficient documentation

## 2023-02-18 DIAGNOSIS — Z8616 Personal history of COVID-19: Secondary | ICD-10-CM | POA: Insufficient documentation

## 2023-02-18 DIAGNOSIS — Z87891 Personal history of nicotine dependence: Secondary | ICD-10-CM | POA: Diagnosis not present

## 2023-02-18 DIAGNOSIS — I2510 Atherosclerotic heart disease of native coronary artery without angina pectoris: Secondary | ICD-10-CM | POA: Diagnosis not present

## 2023-02-18 DIAGNOSIS — I5030 Unspecified diastolic (congestive) heart failure: Secondary | ICD-10-CM | POA: Insufficient documentation

## 2023-02-18 DIAGNOSIS — N132 Hydronephrosis with renal and ureteral calculous obstruction: Secondary | ICD-10-CM | POA: Diagnosis not present

## 2023-02-18 DIAGNOSIS — Z7902 Long term (current) use of antithrombotics/antiplatelets: Secondary | ICD-10-CM | POA: Insufficient documentation

## 2023-02-18 DIAGNOSIS — Z79899 Other long term (current) drug therapy: Secondary | ICD-10-CM | POA: Insufficient documentation

## 2023-02-18 DIAGNOSIS — D72829 Elevated white blood cell count, unspecified: Secondary | ICD-10-CM | POA: Insufficient documentation

## 2023-02-18 DIAGNOSIS — I493 Ventricular premature depolarization: Secondary | ICD-10-CM | POA: Diagnosis not present

## 2023-02-18 DIAGNOSIS — R109 Unspecified abdominal pain: Secondary | ICD-10-CM | POA: Diagnosis not present

## 2023-02-18 MED ORDER — LACTATED RINGERS IV BOLUS
1000.0000 mL | Freq: Once | INTRAVENOUS | Status: AC
  Administered 2023-02-19: 1000 mL via INTRAVENOUS

## 2023-02-18 NOTE — ED Triage Notes (Signed)
Pt c/o right flank pain & vomiting tonight. Pt reports he has a kidney stone, took his pain medication twice tonight with no relief.

## 2023-02-19 LAB — URINALYSIS, ROUTINE W REFLEX MICROSCOPIC
Bilirubin Urine: NEGATIVE
Glucose, UA: NEGATIVE mg/dL
Ketones, ur: 5 mg/dL — AB
Leukocytes,Ua: NEGATIVE
Nitrite: NEGATIVE
Protein, ur: NEGATIVE mg/dL
Specific Gravity, Urine: 1.024 (ref 1.005–1.030)
pH: 5 (ref 5.0–8.0)

## 2023-02-19 LAB — COMPREHENSIVE METABOLIC PANEL
ALT: 19 U/L (ref 0–44)
AST: 22 U/L (ref 15–41)
Albumin: 4 g/dL (ref 3.5–5.0)
Alkaline Phosphatase: 46 U/L (ref 38–126)
Anion gap: 11 (ref 5–15)
BUN: 32 mg/dL — ABNORMAL HIGH (ref 8–23)
CO2: 25 mmol/L (ref 22–32)
Calcium: 8.9 mg/dL (ref 8.9–10.3)
Chloride: 103 mmol/L (ref 98–111)
Creatinine, Ser: 1.3 mg/dL — ABNORMAL HIGH (ref 0.61–1.24)
GFR, Estimated: >60 mL/min (ref >60)
Glucose, Bld: 128 mg/dL — ABNORMAL HIGH (ref 70–99)
Potassium: 3.3 mmol/L — ABNORMAL LOW (ref 3.5–5.1)
Sodium: 139 mmol/L (ref 135–145)
Total Bilirubin: 0.9 mg/dL (ref 0.3–1.2)
Total Protein: 6.7 g/dL (ref 6.5–8.1)

## 2023-02-19 LAB — CBC WITH DIFFERENTIAL/PLATELET
Abs Immature Granulocytes: 0.08 10*3/uL — ABNORMAL HIGH (ref 0.00–0.07)
Basophils Absolute: 0 10*3/uL (ref 0.0–0.1)
Basophils Relative: 0 %
Eosinophils Absolute: 0 10*3/uL (ref 0.0–0.5)
Eosinophils Relative: 0 %
HCT: 43 % (ref 39.0–52.0)
Hemoglobin: 14.9 g/dL (ref 13.0–17.0)
Immature Granulocytes: 1 %
Lymphocytes Relative: 8 %
Lymphs Abs: 1 10*3/uL (ref 0.7–4.0)
MCH: 31.5 pg (ref 26.0–34.0)
MCHC: 34.7 g/dL (ref 30.0–36.0)
MCV: 90.9 fL (ref 80.0–100.0)
Monocytes Absolute: 0.7 10*3/uL (ref 0.1–1.0)
Monocytes Relative: 6 %
Neutro Abs: 10.4 10*3/uL — ABNORMAL HIGH (ref 1.7–7.7)
Neutrophils Relative %: 85 %
Platelets: 186 10*3/uL (ref 150–400)
RBC: 4.73 MIL/uL (ref 4.22–5.81)
RDW: 13.1 % (ref 11.5–15.5)
WBC: 12.3 10*3/uL — ABNORMAL HIGH (ref 4.0–10.5)
nRBC: 0 % (ref 0.0–0.2)

## 2023-02-19 MED ORDER — MORPHINE SULFATE (PF) 4 MG/ML IV SOLN
2.0000 mg | Freq: Once | INTRAVENOUS | Status: AC
  Administered 2023-02-19: 2 mg via INTRAVENOUS
  Filled 2023-02-19: qty 1

## 2023-02-19 MED ORDER — NAPROXEN 375 MG PO TABS: 375.0000 mg | ORAL_TABLET | Freq: Two times a day (BID) | ORAL | 0 refills | Status: DC | PRN

## 2023-02-19 MED ORDER — ACETAMINOPHEN 500 MG PO TABS: 1000.0000 mg | ORAL_TABLET | Freq: Three times a day (TID) | ORAL | 0 refills | Status: AC

## 2023-02-19 MED ORDER — MORPHINE SULFATE (PF) 4 MG/ML IV SOLN
4.0000 mg | Freq: Once | INTRAVENOUS | Status: AC
  Administered 2023-02-19: 4 mg via INTRAVENOUS
  Filled 2023-02-19: qty 1

## 2023-02-19 MED ORDER — KETOROLAC TROMETHAMINE 15 MG/ML IJ SOLN
15.0000 mg | Freq: Once | INTRAMUSCULAR | Status: AC
  Administered 2023-02-19: 15 mg via INTRAVENOUS
  Filled 2023-02-19: qty 1

## 2023-02-19 MED ORDER — OXYCODONE HCL 5 MG PO TABS: 5.0000 mg | ORAL_TABLET | Freq: Four times a day (QID) | ORAL | 0 refills | Status: DC | PRN

## 2023-02-19 MED ORDER — ONDANSETRON 4 MG PO TBDP: 4.0000 mg | ORAL_TABLET | Freq: Three times a day (TID) | ORAL | 0 refills | Status: DC | PRN

## 2023-02-19 MED ORDER — ONDANSETRON HCL 4 MG/2ML IJ SOLN
4.0000 mg | Freq: Once | INTRAMUSCULAR | Status: AC
  Administered 2023-02-19: 4 mg via INTRAVENOUS
  Filled 2023-02-19: qty 2

## 2023-02-19 NOTE — ED Provider Notes (Signed)
[facility] Provider Note  CSN: [id number] Arrival date & time: 02/18/23 2306  Chief Complaint(s) Flank Pain  HPI Jeffrey Allen is a 67 y.o. male with PMH CAD status post DES placement, CHF, previous CVA, previous nephrolithiasis who presents emergency department for evaluation of abdominal pain.  Patient was seen in the emergency department yesterday where a KUB reportedly diagnosed a kidney stone and patient was discharged with pain medication and Flomax.  Patient took pain medication at home but symptoms worsened and patient has had persistent vomiting.  He denies fever, chest pain, shortness of breath, headache or other systemic symptoms.  Pain worse in the right CVA and right lower quadrant   Past Medical History Past Medical History:  Diagnosis Date   Bradycardia    CAD (coronary artery disease)    a. 05/2022: DES placement x1 to the mid/distal LAD, DES placement x1 to D1 and DES placement x2 to the distal LCx   GERD (gastroesophageal reflux disease)    Gout    Heart failure with mildly reduced ejection fraction (HFmrEF) (HCC)    High cholesterol    NSVT (nonsustained ventricular tachycardia) (HCC)    Obesity (BMI 30.0-34.9)    Peptic ulcer disease    Prediabetes    PVC's (premature ventricular contractions)    Stroke ([place]) 06/2017   SVT (supraventricular tachycardia)    Ureterolithiasis    Patient Active Problem List   Diagnosis Date Noted   CAD S/P percutaneous coronary angioplasty/Multi-Vessel CAD/Stents 05/26/22 06/26/2022   Acute exacerbation of CHF (congestive heart failure) ([place]) 06/23/2022   PVC (premature ventricular contraction)/PACs 05/27/2022   Unstable angina ([place])    Diverticulitis of colon 10/07/2020   History of colonic polyps 10/07/2020   Vitamin D deficiency 02/10/2020   Chest pain 12/30/2019   COVID-19 virus infection 12/30/2019   NSTEMI (non-ST elevated myocardial infarction) ([place]) 12/29/2019    Prediabetes 11/07/2019   Hypogonadism, male 11/07/2019   Obesity (BMI 30.0-34.9) 11/07/2019   Stroke ([place])    Essential hypertension 06/19/2018   Mixed hyperlipidemia 06/19/2018   Gout 06/18/2018   GERD (gastroesophageal reflux disease) 06/18/2018   Acute CVA (cerebrovascular accident) ([place]) 06/18/2018   CARPAL TUNNEL SYNDROME 04/06/2009   CUBITAL TUNNEL SYNDROME 04/06/2009   NECK PAIN, CHRONIC 04/06/2009   Disturbance of skin sensation 04/06/2009   ANKLE SPRAIN 12/03/2008   Home Medication(s) Prior to Admission medications   Medication Sig Start Date End Date Taking? Authorizing Provider  acetaminophen (TYLENOL) 500 MG tablet Take 2 tablets (1,000 mg total) by mouth every 8 (eight) hours. 02/19/23 03/21/23 Yes Paulanthony Gleaves, MD  naproxen (NAPROSYN) 375 MG tablet Take 1 tablet (375 mg total) by mouth 2 (two) times daily as needed (breakthrough pain). 02/19/23  Yes Sharnell Knight, MD  ondansetron (ZOFRAN-ODT) 4 MG disintegrating tablet Take 1 tablet (4 mg total) by mouth every 8 (eight) hours as needed for nausea or vomiting. 02/19/23  Yes Maricella Filyaw, MD  oxyCODONE (ROXICODONE) 5 MG immediate release tablet Take 1 tablet (5 mg total) by mouth every 6 (six) hours as needed for severe pain. 02/19/23  Yes Vanilla Heatherington, MD  Alirocumab (PRALUENT) 150 MG/ML SOAJ Inject 1 mL into the skin every 14 (fourteen) days. Patient not taking: Reported on 02/08/2023 10/12/22   [name], MD  allopurinol (ZYLOPRIM) 300 MG tablet Take 0.5 tablets (150 mg total) by mouth daily. 05/18/21   Doree Albee, MD  amiodarone (PACERONE) 200 MG tablet Take 1/2 tablet (100  mg) every OTHER day 01/30/23   Charlie Pitter, PA-C  Ascorbic Acid (VITAMIN C PO) Take 1 tablet by mouth daily.    [provider]  aspirin EC 81 MG tablet Take 1 tablet (81 mg total) by mouth daily with breakfast. 06/25/22   Denton Brick, Courage, MD  Cholecalciferol (VITAMIN D3) 125 MCG (5000 UT) TABS Take 10,000 Units by mouth  daily.     [provider]  clopidogrel (PLAVIX) 75 MG tablet Take 1 tablet (75 mg total) by mouth daily. 01/06/23   [name], MD  co-enzyme Q-10 30 MG capsule Take 100 mg by mouth 3 (three) times daily.    [provider]  ezetimibe (ZETIA) 10 MG tablet TAKE (1) TABLET BY MOUTH ONCE DAILY. 07/18/22   [name], MD  gabapentin (NEURONTIN) 100 MG capsule TAKE 1 CAPSULE BY MOUTH THREE TIMES A DAY. Patient taking differently: Take 100 mg by mouth as needed. 09/22/21   Lindell Spar, MD  Garlic [id number] MG CAPS Take 1,000 mg by mouth daily.    [provider]  losartan (COZAAR) 25 MG tablet Take 1 tablet (25 mg total) by mouth daily. 07/19/22   Strader, Fransisco Hertz, PA-C  nitroGLYCERIN (NITROSTAT) 0.4 MG SL tablet Place 1 tablet (0.4 mg total) under the tongue every 5 (five) minutes as needed for chest pain. 05/27/22   Cheryln Manly, NP  oxyCODONE-acetaminophen (PERCOCET/ROXICET) 5-325 MG tablet Take 1-2 tablets by mouth every 8 (eight) hours as needed for severe pain. 02/17/23   Davonna Belling, MD  pantoprazole (PROTONIX) 40 MG tablet Take 1 tablet (40 mg total) by mouth daily. 07/14/21   Hurshel Party C, MD  potassium chloride SA (KLOR-CON M) 20 MEQ tablet Take 2 tablets (40 mEq total) by mouth daily. 02/13/23   Dunn, Nedra Hai, PA-C  Red Yeast Rice Extract (RED YEAST RICE PO) Take 2 tablets by mouth daily.    [provider]  tamsulosin (FLOMAX) 0.4 MG CAPS capsule Take 1 capsule (0.4 mg total) by mouth daily. 02/17/23   Davonna Belling, MD  vitamin B-12 (CYANOCOBALAMIN) 1000 MCG tablet Take 1,000 mcg by mouth daily.    [provider]                                                                                                                                    Past Surgical History Past Surgical History:  Procedure Laterality Date   BIOPSY  11/25/2020   Procedure: BIOPSY;  Surgeon: Daneil Dolin, MD;  [facility];   Service: Endoscopy;;  gastric   COLONOSCOPY     COLONOSCOPY N/A 12/07/2016   Scattered small and large-mouthed diverticula in sigmoid and descending colon. Six semi-pedunculated polyps in sigmoid and ascending colon, 4-6 mm in size. Distal TI normal appearing. Hyperplastic and tubular adenomas. 3 year surveillance recommended.    COLONOSCOPY N/A 11/25/2020   diverticulosis in sigmoid  and descending colon, three 3-4 mm polyps at rectosigmoid colon and hepatic flexure. Hyperplastic polyp. 5 year colonoscopy.    CORONARY STENT INTERVENTION N/A 05/26/2022   Procedure: CORONARY STENT INTERVENTION;  Surgeon: Burnell Blanks, MD;  Location: [facility];  Service: Cardiovascular;  Laterality: N/A;   ESOPHAGOGASTRODUODENOSCOPY N/A 11/25/2020     normal esophagus, portal gastropathy, normal duodenum. Mild chronic gastritis, negative H.pylori.    LEFT HEART CATH AND CORS/GRAFTS ANGIOGRAPHY N/A 05/26/2022   Procedure: LEFT HEART CATH AND CORS/GRAFTS ANGIOGRAPHY;  Surgeon: Burnell Blanks, MD;  Location: [facility];  Service: Cardiovascular;  Laterality: N/A;   POLYPECTOMY  11/25/2020   Procedure: POLYPECTOMY;  Surgeon: Daneil Dolin, MD;  [facility];  Service: Endoscopy;;   TOOTH EXTRACTION     Family History Family History  Problem Relation Age of Onset   Dementia Mother    Hypertension Father    Diabetes Father    COPD Father    Hyperlipidemia Father    Heart attack Father    Colon cancer Neg Hx     Social History Social History   Tobacco Use   Smoking status: Former    Packs/day: 0.02    Years: 25.00    Total pack years: 0.50    Types: Cigarettes    Quit date: 12/26/2006    Years since quitting: 16.1   Smokeless tobacco: Never  Vaping Use   Vaping Use: Never used  Substance Use Topics   Alcohol use: Not Currently   Drug use: Not Currently    Types: Marijuana   Allergies Prednisone and Statins  Review of Systems Review of Systems   Gastrointestinal:  Positive for abdominal pain.  Genitourinary:  Positive for flank pain.    Physical Exam Vital Signs  I have reviewed the triage vital signs BP 114/74 (BP Location: Left Arm)   Pulse 60   Temp 97.8 F (36.6 C) (Oral)   Resp 17   Ht '5\' 8"'$  (1.727 m)   Wt 85.3 kg   SpO2 95%   BMI 28.59 kg/m   Physical Exam Vitals and nursing note reviewed.  Constitutional:      General: He is not in acute distress.    Appearance: He is well-developed. He is ill-appearing.  HENT:     Head: Normocephalic and atraumatic.  Eyes:     Conjunctiva/sclera: Conjunctivae normal.  Cardiovascular:     Rate and Rhythm: Normal rate and regular rhythm.     Heart sounds: No murmur heard. Pulmonary:     Effort: Pulmonary effort is normal. No respiratory distress.     Breath sounds: Normal breath sounds.  Abdominal:     Palpations: Abdomen is soft.     Tenderness: There is abdominal tenderness. There is right CVA tenderness.  Musculoskeletal:        General: No swelling.     Cervical back: Neck supple.  Skin:    General: Skin is warm and dry.     Capillary Refill: Capillary refill takes less than 2 seconds.  Neurological:     Mental Status: He is alert.  Psychiatric:        Mood and Affect: Mood normal.     ED Results and Treatments Labs (all labs ordered are listed, but only abnormal results are displayed) Labs Reviewed  COMPREHENSIVE METABOLIC PANEL - Abnormal; Notable for the following components:      Result Value   Potassium 3.3 (*)    Glucose, Bld 128 (*)  BUN 32 (*)    Creatinine, Ser 1.30 (*)    All other components within normal limits  CBC WITH DIFFERENTIAL/PLATELET - Abnormal; Notable for the following components:   WBC 12.3 (*)    Neutro Abs 10.4 (*)    Abs Immature Granulocytes 0.08 (*)    All other components within normal limits  URINALYSIS, ROUTINE W REFLEX MICROSCOPIC - Abnormal; Notable for the following components:   Hgb urine dipstick LARGE (*)     Ketones, ur 5 (*)    Bacteria, UA RARE (*)    All other components within normal limits  URINE CULTURE                                                                                                                          Radiology CT Renal Stone Study  Result Date: 02/19/2023 CLINICAL DATA:  Right flank pain and vomiting EXAM: CT ABDOMEN AND PELVIS WITHOUT CONTRAST TECHNIQUE: Multidetector CT imaging of the abdomen and pelvis was performed following the standard protocol without IV contrast. RADIATION DOSE REDUCTION: This exam was performed according to the departmental dose-optimization program which includes automated exposure control, adjustment of the mA and/or kV according to patient size and/or use of iterative reconstruction technique. COMPARISON:  01/31/2023 FINDINGS: Lower chest: No acute abnormality. Hepatobiliary: No suspicious hepatic lesion. Biliary tree and gallbladder are unremarkable. Pancreas: Unremarkable. Spleen: Unremarkable. Adrenals/Urinary Tract: Normal adrenal glands. Asymmetric stranding about the right kidney with moderate right hydroureteronephrosis upstream from an obstructing 4 mm calculus in the proximal right ureter. Additional tiny nonobstructing lower pole right nephrolithiasis left kidney and bladder are unremarkable. Stomach/Bowel: Normal caliber large and small bowel. Colonic diverticulosis without diverticulitis. Normal appendix. Unremarkable stomach. Vascular/Lymphatic: Aortic atherosclerosis. No enlarged abdominal or pelvic lymph nodes. Reproductive: Unremarkable. Other: No free intraperitoneal air. Small fat containing inguinal hernias. Musculoskeletal: No acute or significant osseous findings. IMPRESSION: 1. Obstructing 4 mm calculus in the proximal right ureter with moderate right hydroureteronephrosis. 2. Periureteral stranding about the right kidney may be due to pyelonephritis. Aortic Atherosclerosis (ICD10-I70.0). Electronically Signed   By: Placido Sou M.D.   On: 02/19/2023 00:14    Pertinent labs & imaging results that were available during my care of the patient were reviewed by me and considered in my medical decision making (see MDM for details).  Medications Ordered in ED Medications  morphine (PF) 4 MG/ML injection 2 mg (has no administration in time range)  lactated ringers bolus 1,000 mL (0 mLs Intravenous Stopped 02/19/23 0122)  ketorolac (TORADOL) 15 MG/ML injection 15 mg (15 mg Intravenous Given 02/19/23 0019)  morphine (PF) 4 MG/ML injection 4 mg (4 mg Intravenous Given 02/19/23 0017)  ondansetron (ZOFRAN) injection 4 mg (4 mg Intravenous Given 02/19/23 0016)  Procedures Procedures  (including critical care time)  Medical Decision Making / ED Course   This patient presents to the ED for concern of flank pain, abdominal pain, this involves an extensive number of treatment options, and is a complaint that carries with it a high risk of complications and morbidity.  The differential diagnosis includes nephrolithiasis, pyelonephritis, septic stone, diverticulitis, pancreatitis, intestinal gas  MDM: Patient seen emergency room for evaluation of flank and abdomen pain.  Physical exam with right CVA tenderness and right lower quadrant tenderness to palpation.  Laboratory evaluation with leukocytosis of 12.3, likely stress demargination from pain, potassium 3.3, BUN 32, creatinine 1.30 which is a mild elevation for this patient.  Urinalysis reassuringly negative for infection and is showing hematuria.  CT abdomen pelvis with a 4 mm right-sided stone with moderate hydro and stranding around the kidney parenchyma.  Given normal urinalysis, lower suspicion for concomitant pyelonephritis and septic stone.  Thus, we will hold off on antibiotics.  Patient pain controlled with Toradol and morphine.  Patient is  on Plavix and will need to be cautious with NSAID dosing and thus I refilled his opioid prescription, encouraged patient to continue taking his Flomax that was prescribed on the 23rd and I sent a referral to [facility] for follow-up.  Patient will call the urologist office on Monday.  Of note, patient is having frequent PVCs here in the emergency department but this appears to be baseline for this patient and he is currently on amiodarone for this.  He states that he did take his amiodarone today due to his frequent vomiting.  At this time, patient remains hemodynamically stable with pain controlled, patient discharged with outpatient urologic follow-up and return precautions of which he and his wife voiced understanding.   Additional history obtained: -Additional history obtained from wife -External records from outside source obtained and reviewed including: Chart review including previous notes, labs, imaging, consultation notes   Lab Tests: -I ordered, reviewed, and interpreted labs.   The pertinent results include:   Labs Reviewed  COMPREHENSIVE METABOLIC PANEL - Abnormal; Notable for the following components:      Result Value   Potassium 3.3 (*)    Glucose, Bld 128 (*)    BUN 32 (*)    Creatinine, Ser 1.30 (*)    All other components within normal limits  CBC WITH DIFFERENTIAL/PLATELET - Abnormal; Notable for the following components:   WBC 12.3 (*)    Neutro Abs 10.4 (*)    Abs Immature Granulocytes 0.08 (*)    All other components within normal limits  URINALYSIS, ROUTINE W REFLEX MICROSCOPIC - Abnormal; Notable for the following components:   Hgb urine dipstick LARGE (*)    Ketones, ur 5 (*)    Bacteria, UA RARE (*)    All other components within normal limits  URINE CULTURE      EKG   EKG Interpretation  Date/Time:  Sunday February 19 2023 01:20:27 EST Ventricular Rate:  69 PR Interval:  250 QRS Duration: 110 QT Interval:  466 QTC Calculation: 412 R  Axis:   -13 Text Interpretation: Sinus rhythm Ventricular bigeminy Prolonged PR interval Confirmed by [place] ([id number]) on 02/19/2023 2:16:54 AM         Imaging Studies ordered: I ordered imaging studies including CT abdomen pelvis I independently visualized and interpreted imaging. I agree with the radiologist interpretation   Medicines ordered and prescription drug management: Meds ordered this encounter  Medications   lactated ringers bolus 1,000  mL   ketorolac (TORADOL) 15 MG/ML injection 15 mg   morphine (PF) 4 MG/ML injection 4 mg   ondansetron (ZOFRAN) injection 4 mg   acetaminophen (TYLENOL) 500 MG tablet    Sig: Take 2 tablets (1,000 mg total) by mouth every 8 (eight) hours.    Dispense:  180 tablet    Refill:  0   oxyCODONE (ROXICODONE) 5 MG immediate release tablet    Sig: Take 1 tablet (5 mg total) by mouth every 6 (six) hours as needed for severe pain.    Dispense:  14 tablet    Refill:  0   naproxen (NAPROSYN) 375 MG tablet    Sig: Take 1 tablet (375 mg total) by mouth 2 (two) times daily as needed (breakthrough pain).    Dispense:  10 tablet    Refill:  0   ondansetron (ZOFRAN-ODT) 4 MG disintegrating tablet    Sig: Take 1 tablet (4 mg total) by mouth every 8 (eight) hours as needed for nausea or vomiting.    Dispense:  20 tablet    Refill:  0   morphine (PF) 4 MG/ML injection 2 mg    -I have reviewed the patients home medicines and have made adjustments as needed  Critical interventions none   Cardiac Monitoring: The patient was maintained on a cardiac monitor.  I personally viewed and interpreted the cardiac monitored which showed an underlying rhythm of: NSR with frequent PVCs  Social Determinants of Health:  Factors impacting patients care include: none   Reevaluation: After the interventions noted above, I reevaluated the patient and found that they have :improved  Co morbidities that complicate the patient evaluation  Past Medical  History:  Diagnosis Date   Bradycardia    CAD (coronary artery disease)    a. 05/2022: DES placement x1 to the mid/distal LAD, DES placement x1 to D1 and DES placement x2 to the distal LCx   GERD (gastroesophageal reflux disease)    Gout    Heart failure with mildly reduced ejection fraction (HFmrEF) (HCC)    High cholesterol    NSVT (nonsustained ventricular tachycardia) (HCC)    Obesity (BMI 30.0-34.9)    Peptic ulcer disease    Prediabetes    PVC's (premature ventricular contractions)    Stroke ([place]) 06/2017   SVT (supraventricular tachycardia)    Ureterolithiasis       Dispostion: I considered admission for this patient, but at this time he does not meet inpatient criteria for admission he is safe for discharge with outpatient follow-up with return precautions     Final Clinical Impression(s) / ED Diagnoses Final diagnoses:  Nephrolithiasis  PVC's (premature ventricular contractions)     '@PCDICTATION'$ @    Teressa Lower, MD 02/19/23 [phone]

## 2023-02-19 NOTE — Discharge Instructions (Signed)
For pain:  - Acetaminophen 1000 mg three times daily (every 8 hours) - oxycodone every 6 hours for breakthrough pain  - Naproxen 2 times daily (every 12 hours) for breakthough pain only (be careful not to take this too often because of your plavix use)

## 2023-02-20 DIAGNOSIS — N132 Hydronephrosis with renal and ureteral calculous obstruction: Secondary | ICD-10-CM | POA: Diagnosis not present

## 2023-02-20 DIAGNOSIS — N201 Calculus of ureter: Secondary | ICD-10-CM | POA: Diagnosis not present

## 2023-02-20 LAB — URINE CULTURE: Culture: NO GROWTH

## 2023-02-23 ENCOUNTER — Other Ambulatory Visit: Payer: Self-pay | Admitting: Cardiology

## 2023-03-14 DIAGNOSIS — N132 Hydronephrosis with renal and ureteral calculous obstruction: Secondary | ICD-10-CM | POA: Diagnosis not present

## 2023-03-15 DIAGNOSIS — Z8673 Personal history of transient ischemic attack (TIA), and cerebral infarction without residual deficits: Secondary | ICD-10-CM | POA: Diagnosis not present

## 2023-03-15 DIAGNOSIS — K219 Gastro-esophageal reflux disease without esophagitis: Secondary | ICD-10-CM | POA: Diagnosis not present

## 2023-03-15 DIAGNOSIS — G720 Drug-induced myopathy: Secondary | ICD-10-CM | POA: Diagnosis not present

## 2023-03-15 DIAGNOSIS — E7801 Familial hypercholesterolemia: Secondary | ICD-10-CM | POA: Diagnosis not present

## 2023-05-31 ENCOUNTER — Encounter: Payer: Self-pay | Admitting: Cardiology

## 2023-05-31 ENCOUNTER — Ambulatory Visit: Payer: Medicare Other | Attending: Cardiology | Admitting: Cardiology

## 2023-05-31 VITALS — BP 142/96 | HR 54 | Ht 67.0 in | Wt 197.8 lb

## 2023-05-31 DIAGNOSIS — I493 Ventricular premature depolarization: Secondary | ICD-10-CM

## 2023-05-31 DIAGNOSIS — E782 Mixed hyperlipidemia: Secondary | ICD-10-CM

## 2023-05-31 DIAGNOSIS — I5022 Chronic systolic (congestive) heart failure: Secondary | ICD-10-CM

## 2023-05-31 NOTE — Patient Instructions (Signed)
Medication Instructions:   Your physician recommends that you continue on your current medications as directed. Please refer to the Current Medication list given to you today.  Labwork: Fasting Lipids in November  Testing/Procedures: Echo in November   Follow-Up: November after echo  Any Other Special Instructions Will Be Listed Below (If Applicable).  If you need a refill on your cardiac medications before your next appointment, please call your pharmacy.

## 2023-05-31 NOTE — Progress Notes (Signed)
Cardiology Office Note  Date: 05/31/2023   ID: SUSANA AGUILLAR, DOB Sep 02, 1956, MRN [id number]  History of Present Illness: Jeffrey Allen is a 67 y.o. male last seen in February by Ms. Collene Schlichter, I reviewed the note.  He is here today with his wife for a follow-up visit.  States that overall he has been doing well.  He does not report any sense of palpitations, no dizziness or syncope, no exertional chest pain.  He has been enjoying carpentry work.  We went over his medications.  His wife does tell me that he is not entirely consistent with therapy, sometimes forgets to take them.  He has been tolerating amiodarone 100 mg every other day and recently started on Repatha.  He had been on Zetia prior to that and his last LDL was 123 in February, history of statin intolerance.  Blood pressure up today, he had not yet taken losartan.  I did review his interval lab work and we discussed getting a follow-up echocardiogram as well as lipid panel later in the year.  Physical Exam: VS:  BP (!) 142/96   Pulse (!) 54   Ht 5\' 7"  (1.702 m)   Wt 197 lb 12.8 oz (89.7 kg)   SpO2 97%   BMI 30.98 kg/m , BMI Body mass index is 30.98 kg/m.  Wt Readings from Last 3 Encounters:  05/31/23 197 lb 12.8 oz (89.7 kg)  02/18/23 188 lb 0.8 oz (85.3 kg)  02/17/23 188 lb (85.3 kg)    General: Patient appears comfortable at rest. HEENT: Conjunctiva and lids normal. Neck: Supple, no elevated JVP or carotid bruits. Lungs: Clear to auscultation, nonlabored breathing at rest. Cardiac: Regular rate and rhythm, no S3, 1/6 systolic murmur. Extremities: No pitting edema.  ECG:  An ECG dated 02/19/2023 was personally reviewed today and demonstrated:  Sinus rhythm with ventricular bigeminy.  Labwork: 06/23/2022: B Natriuretic Peptide 293.0 01/30/2023: Magnesium 2.3; TSH 1.244 02/19/2023: ALT 19; AST 22; BUN 32; Creatinine, Ser 1.30; Hemoglobin 14.9; Platelets 186; Potassium 3.3; Sodium 139     Component Value  Date/Time   CHOL 208 (H) 02/09/2023 0842   CHOL 230 (H) 01/04/2023 0819   TRIG 154 (H) 02/09/2023 0842   HDL 54 02/09/2023 0842   HDL 63 01/04/2023 0819   CHOLHDL 3.9 02/09/2023 0842   VLDL 31 02/09/2023 0842   LDLCALC 123 (H) 02/09/2023 0842   LDLCALC 144 (H) 01/04/2023 0819   LDLCALC 162 (H) 02/16/2021 0940   Other Studies Reviewed Today:  Echocardiogram 10/28/2022:  1. Left ventricular ejection fraction, by estimation, is 45 to 50%. The  left ventricle has mildly decreased function. The left ventricle  demonstrates global hypokinesis. Left ventricular diastolic parameters are  indeterminate.   2. Right ventricular systolic function is normal. The right ventricular  size is normal.   3. Left atrial size was mildly dilated.   4. The mitral valve is grossly normal. Trivial mitral valve  regurgitation.   5. The aortic valve is tricuspid. Aortic valve regurgitation is trivial.   Assessment and Plan:  1.  Multivessel CAD status post DES to the mid to distal LAD, DES to the first diagonal, and DES x 2 to the distal circumflex in June 2023.  He does not report any active angina at this time.  Plan to continue long-term dual antiplatelet therapy as tolerated in light of stent burden.  Continue Repatha, losartan, and as needed nitroglycerin.  2.  HFmrEF, LVEF 45 to 50% by  echocardiogram in November 2023.  Currently on losartan.  Recheck echocardiogram in November of this year.  3.  Symptomatic PVCs, improved on low-dose amiodarone 100 mg every other day.  4.  Mixed hyperlipidemia.  He has a history of statin myalgias.  LDL 123 in February.  Currently on Zetia and red yeast rice extract with recent addition of Repatha.  Recheck FLP in November and most likely simplify to Repatha alone at that point.  Disposition:  Follow up  December.  Signed, Jonelle Sidle, M.D., F.A.C.C. [facility]

## 2023-06-27 ENCOUNTER — Ambulatory Visit: Payer: Medicare Other | Admitting: Internal Medicine

## 2023-07-03 ENCOUNTER — Telehealth: Payer: Self-pay | Admitting: Cardiology

## 2023-07-03 NOTE — Telephone Encounter (Signed)
STAT if HR is under 50 or over 120 (normal HR is 60-100 beats per minute)  What is your heart rate? Wife is unsure but believes it's better than this mornings readings, at 38-47 HR, after taking medications. She states he is no longer at home, so she believes he is better.   Do you have a log of your heart rate readings (document readings)?   Do you have any other symptoms? Weak and lightheadedness this morning and SOB. Also felt hot. BP this morning 144/90. 122/87 before he left house.

## 2023-07-03 NOTE — Telephone Encounter (Signed)
Noted message from Dr.McDowell

## 2023-07-03 NOTE — Telephone Encounter (Signed)
Wife states patient had kIdney stone last week, spent 15 hours vomiting,says he passed large stone in the toilet. Then yesterday at church wife said he "wasn't acting right", wasn't singing strongly. 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 He is not home now.   This morning they recorded a HR in the low 40's but went on about his day.  Wife has gone through his pill container has has found he hasn't taken cozaar or zetia in the past week. She now finds he is only taking amiodarone 1/2 tablet perhaps once or twice a week vs every other day. She is going to encourage him to take his medication as prescribed.

## 2023-07-31 DIAGNOSIS — G5603 Carpal tunnel syndrome, bilateral upper limbs: Secondary | ICD-10-CM | POA: Diagnosis not present

## 2023-07-31 DIAGNOSIS — M720 Palmar fascial fibromatosis [Dupuytren]: Secondary | ICD-10-CM | POA: Diagnosis not present

## 2023-08-19 DIAGNOSIS — K080 Exfoliation of teeth due to systemic causes: Secondary | ICD-10-CM | POA: Diagnosis not present

## 2023-08-24 DIAGNOSIS — K080 Exfoliation of teeth due to systemic causes: Secondary | ICD-10-CM | POA: Diagnosis not present

## 2023-09-07 DIAGNOSIS — K080 Exfoliation of teeth due to systemic causes: Secondary | ICD-10-CM | POA: Diagnosis not present

## 2023-09-12 ENCOUNTER — Encounter: Payer: Self-pay | Admitting: Internal Medicine

## 2023-09-12 ENCOUNTER — Ambulatory Visit: Payer: Medicare Other | Admitting: Internal Medicine

## 2023-09-12 VITALS — BP 110/65 | HR 45 | Temp 97.3°F | Ht 67.0 in | Wt 200.8 lb

## 2023-09-12 DIAGNOSIS — K219 Gastro-esophageal reflux disease without esophagitis: Secondary | ICD-10-CM

## 2023-09-12 DIAGNOSIS — Z86010 Personal history of colonic polyps: Secondary | ICD-10-CM

## 2023-09-12 NOTE — Progress Notes (Unsigned)
Primary Care Physician:  Donetta Potts, MD Primary Gastroenterologist:  Dr. Jena Gauss  Pre-Procedure History & Physical: HPI:  Jeffrey Allen is a 67 y.o. male here for   If follow-up of GERD.  Well-controlled on Protonix 40 mg daily no  esophagitis or Barrett's on prior EGD.  Denies dysphagia.  Since I last saw him a has well chronic with coronary artery disease and has multiple coronary stents.  He does not have any bowel symptoms.  History of colonic adenomas last colonoscopy 2021- hyperplastic polyp;  due for surveillance 2026. Past Medical History:  Diagnosis Date   Bradycardia    CAD (coronary artery disease)    a. 05/2022: DES placement x1 to the mid/distal LAD, DES placement x1 to D1 and DES placement x2 to the distal LCx   GERD (gastroesophageal reflux disease)    Gout    Heart failure with mildly reduced ejection fraction (HFmrEF) (HCC)    High cholesterol    NSVT (nonsustained ventricular tachycardia) (HCC)    Obesity (BMI 30.0-34.9)    Peptic ulcer disease    Prediabetes    PVC's (premature ventricular contractions)    Stroke (HCC) 06/2017   SVT (supraventricular tachycardia)    Ureterolithiasis     Past Surgical History:  Procedure Laterality Date   BIOPSY  11/25/2020   Procedure: BIOPSY;  Surgeon: Corbin Ade, MD;  Location: [facility];  Service: Endoscopy;;  gastric   COLONOSCOPY     COLONOSCOPY N/A 12/07/2016   Scattered small and large-mouthed diverticula in sigmoid and descending colon. Six semi-pedunculated polyps in sigmoid and ascending colon, 4-6 mm in size. Distal TI normal appearing. Hyperplastic and tubular adenomas. 3 year surveillance recommended.    COLONOSCOPY N/A 11/25/2020   diverticulosis in sigmoid and descending colon, three 3-4 mm polyps at rectosigmoid colon and hepatic flexure. Hyperplastic polyp. 5 year colonoscopy.    CORONARY STENT INTERVENTION N/A 05/26/2022   Procedure: CORONARY STENT INTERVENTION;  Surgeon: Kathleene Hazel, MD;  Location: [facility];  Service: Cardiovascular;  Laterality: N/A;   ESOPHAGOGASTRODUODENOSCOPY N/A 11/25/2020     normal esophagus, portal gastropathy, normal duodenum. Mild chronic gastritis, negative H.pylori.    LEFT HEART CATH AND CORS/GRAFTS ANGIOGRAPHY N/A 05/26/2022   Procedure: LEFT HEART CATH AND CORS/GRAFTS ANGIOGRAPHY;  Surgeon: Kathleene Hazel, MD;  Location: [facility];  Service: Cardiovascular;  Laterality: N/A;   POLYPECTOMY  11/25/2020   Procedure: POLYPECTOMY;  Surgeon: Corbin Ade, MD;  Location: [facility];  Service: Endoscopy;;   TOOTH EXTRACTION      Prior to Admission medications   Medication Sig Start Date End Date Taking? Authorizing Provider  allopurinol (ZYLOPRIM) 300 MG tablet Take 0.5 tablets (150 mg total) by mouth daily. 05/18/21  Yes Gosrani, Nimish C, MD  Cholecalciferol (VITAMIN D3) 125 MCG (5000 UT) TABS Take 10,000 Units by mouth daily.    Yes [provider]  clopidogrel (PLAVIX) 75 MG tablet Take 1 tablet (75 mg total) by mouth daily. Patient taking differently: Take 75 mg by mouth every other day. 01/06/23  Yes Jonelle Sidle, MD  co-enzyme Q-10 30 MG capsule Take 100 mg by mouth 3 (three) times daily.   Yes [provider]  Evolocumab (REPATHA) 140 MG/ML SOSY Inject into the skin.   Yes [provider]  gabapentin (NEURONTIN) 100 MG capsule TAKE 1 CAPSULE BY MOUTH THREE TIMES A DAY. Patient taking differently: Take 100 mg by mouth as needed.  09/22/21  Yes Anabel Halon, MD  losartan (COZAAR) 25 MG tablet Take 1 tablet (25 mg total) by mouth daily. 07/19/22  Yes Strader, [place] M, PA-C  nitroGLYCERIN (NITROSTAT) 0.4 MG SL tablet Place 1 tablet (0.4 mg total) under the tongue every 5 (five) minutes as needed for chest pain. 05/27/22  Yes Laverda Page B, NP  pantoprazole (PROTONIX) 40 MG tablet Take 1 tablet (40 mg total) by mouth daily. 07/14/21  Yes Gosrani, Nimish C, MD   potassium chloride SA (KLOR-CON M) 20 MEQ tablet Take 2 tablets (40 mEq total) by mouth daily. 02/13/23  Yes Dunn, Dayna N, PA-C  Red Yeast Rice Extract (RED YEAST RICE PO) Take 2 tablets by mouth daily.   Yes [provider]  vitamin B-12 (CYANOCOBALAMIN) 1000 MCG tablet Take 1,000 mcg by mouth daily.   Yes [provider]    Allergies as of 09/12/2023 - Review Complete 09/12/2023  Allergen Reaction Noted   Prednisone Other (See Comments) 09/04/2015   Statins  02/28/2022    Family History  Problem Relation Age of Onset   Dementia Mother    Hypertension Father    Diabetes Father    COPD Father    Hyperlipidemia Father    Heart attack Father    Colon cancer Neg Hx     Social History   Socioeconomic History   Marital status: Married    Spouse name: Not on file   Number of children: Not on file   Years of education: Not on file   Highest education level: Not on file  Occupational History   Not on file  Tobacco Use   Smoking status: Former    Current packs/day: 0.00    Average packs/day: (0.5 ttl pk-yrs)    Types: Cigarettes    Start date: 12/26/1981    Quit date: 12/26/2006    Years since quitting: 16.7   Smokeless tobacco: Never  Vaping Use   Vaping status: Never Used  Substance and Sexual Activity   Alcohol use: Not Currently   Drug use: Not Currently    Types: Marijuana   Sexual activity: Yes  Other Topics Concern   Not on file  Social History Narrative   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   Social Determinants of Health   Financial Resource Strain: Not on file  Food Insecurity: Not on file  Transportation Needs: Not on file  Physical Activity: Not on file  Stress: Not on file  Social Connections: Not on file  Intimate Partner Violence: Not on file    Review of Systems: See HPI, otherwise negative ROS  Physical Exam: BP 110/65 (BP Location: Right Arm, Patient Position: Sitting, Cuff Size: Large)   Pulse (!) 45    Temp (!) 97.3 F (36.3 C) (Temporal)   Ht 5\' 7"  (1.702 m)   Wt 200 lb 12.8 oz (91.1 kg)   SpO2 100%   BMI 31.45 kg/m  General:   Alert,  Well-developed, well-nourished, pleasant and cooperative in NAD Lungs:  Clear throughout to auscultation.   No wheezes, crackles, or rhonchi. No acute distress. Heart:  Regular rate and rhythm; no murmurs, clicks, rubs,  or gallops. Abdomen: Non-distended, normal bowel sounds.  Soft and nontender without appreciable mass or hepatosplenomegaly.    Impression/Plan:    Pleasant 55-year-old gentleman with coronary artery disease status post multiple coronary stents comes for follow-up of GERD.  Well-controlled on once daily PPI.  No alarm symptoms whatsoever.  Prior EGD  findings as outlined above. History of colonic adenoma; negative colonoscopy 2021;   Due for surveillance examination 2026   Recommendations:  GERD information provided  Continue Protonix 40 mg daily best taken 30 minutes before breakfast  As discussed, plan for surveillance colonoscopy (history of colon polyps) in 2026.  Unless something comes up, we will see  Patient back in the office in 1 year.     Notice: This dictation was prepared with Dragon dictation along with smaller phrase technology. Any transcriptional errors that result from this process are unintentional and may not be corrected upon review.

## 2023-09-12 NOTE — Patient Instructions (Signed)
It was good to see you again today!  GERD information provided  Continue Protonix 40 mg daily best taken 30 minutes before breakfast  As discussed, we will plan for surveillance colonoscopy (history of colon polyps) in 2026.  Unless something comes up, we will see you back in the office in 1 year.

## 2023-09-18 DIAGNOSIS — R202 Paresthesia of skin: Secondary | ICD-10-CM | POA: Diagnosis not present

## 2023-09-18 DIAGNOSIS — R200 Anesthesia of skin: Secondary | ICD-10-CM | POA: Diagnosis not present

## 2023-09-19 DIAGNOSIS — K080 Exfoliation of teeth due to systemic causes: Secondary | ICD-10-CM | POA: Diagnosis not present

## 2023-11-06 ENCOUNTER — Ambulatory Visit (HOSPITAL_COMMUNITY)
Admission: RE | Admit: 2023-11-06 | Discharge: 2023-11-06 | Disposition: A | Discharge status: Home / Self Care | Payer: Medicare Other | Source: Ambulatory Visit | Attending: Cardiology | Admitting: Cardiology

## 2023-11-06 DIAGNOSIS — I5022 Chronic systolic (congestive) heart failure: Secondary | ICD-10-CM | POA: Diagnosis not present

## 2023-11-06 DIAGNOSIS — I428 Other cardiomyopathies: Secondary | ICD-10-CM | POA: Diagnosis not present

## 2023-11-06 LAB — ECHOCARDIOGRAM COMPLETE
Area-P 1/2: 2.91 cm2
S' Lateral: 4.1 cm

## 2023-11-06 NOTE — Progress Notes (Signed)
*  PRELIMINARY RESULTS* Echocardiogram 2D Echocardiogram has been performed.  Stacey Drain 11/06/2023, 9:31 AM

## 2023-11-09 DIAGNOSIS — G5603 Carpal tunnel syndrome, bilateral upper limbs: Secondary | ICD-10-CM | POA: Diagnosis not present

## 2023-11-10 DIAGNOSIS — G5603 Carpal tunnel syndrome, bilateral upper limbs: Secondary | ICD-10-CM | POA: Diagnosis not present

## 2023-11-15 ENCOUNTER — Telehealth: Payer: Self-pay | Admitting: Cardiology

## 2023-11-15 NOTE — Telephone Encounter (Signed)
   Name: Jeffrey Allen  DOB: [birth year]  MRN: [id number]  Primary Cardiologist: Nona Dell, MD  Chart reviewed as part of pre-operative protocol coverage. The patient has an upcoming visit scheduled with Dr. Diona Browner on 11/17/23 at which time clearance can be addressed in case there are any issues that would impact surgical recommendations.  Right carpal tunnel release is not scheduled until 12/01/23 as below. I added preop FYI to appointment note so that provider is aware to address at time of outpatient visit.  Per office protocol the cardiology provider should forward their finalized clearance decision and recommendations regarding antiplatelet therapy to the requesting party below.    I will route this message as FYI to requesting party and remove this message from the preop box as separate preop APP input not needed at this time.   Please call with any questions.  Rip Harbour, NP  11/15/2023, 5:37 PM

## 2023-11-15 NOTE — Telephone Encounter (Signed)
   Pre-operative Risk Assessment    Patient Name: Jeffrey Allen  DOB: Dec 10, 1956 MRN: [id number]      Request for Surgical Clearance    Procedure:   right carpal tunnel release  Date of Surgery:  Clearance 12/01/23                                 Surgeon:  Dr. Shaune Pollack Surgeon's Group or Practice Name:  [facility] Phone number:  [phone] Fax number:  [phone] attn Rhonda   Type of Clearance Requested:   - Medical  - Pharmacy:  Hold if applicable      Type of Anesthesia:  Local    Additional requests/questions:    SignedSeymour Bars   11/15/2023, 8:44 AM

## 2023-11-17 ENCOUNTER — Ambulatory Visit: Payer: Medicare Other | Attending: Cardiology | Admitting: Cardiology

## 2023-11-17 ENCOUNTER — Ambulatory Visit: Payer: Medicare Other | Attending: Cardiology

## 2023-11-17 ENCOUNTER — Encounter: Payer: Self-pay | Admitting: Cardiology

## 2023-11-17 VITALS — BP 138/82 | HR 64 | Ht 67.0 in | Wt 199.6 lb

## 2023-11-17 DIAGNOSIS — I2510 Atherosclerotic heart disease of native coronary artery without angina pectoris: Secondary | ICD-10-CM

## 2023-11-17 DIAGNOSIS — Z01818 Encounter for other preprocedural examination: Secondary | ICD-10-CM

## 2023-11-17 DIAGNOSIS — T466X5A Adverse effect of antihyperlipidemic and antiarteriosclerotic drugs, initial encounter: Secondary | ICD-10-CM

## 2023-11-17 DIAGNOSIS — I5022 Chronic systolic (congestive) heart failure: Secondary | ICD-10-CM

## 2023-11-17 DIAGNOSIS — I493 Ventricular premature depolarization: Secondary | ICD-10-CM

## 2023-11-17 DIAGNOSIS — E782 Mixed hyperlipidemia: Secondary | ICD-10-CM

## 2023-11-17 DIAGNOSIS — I25119 Atherosclerotic heart disease of native coronary artery with unspecified angina pectoris: Secondary | ICD-10-CM | POA: Diagnosis not present

## 2023-11-17 DIAGNOSIS — M791 Myalgia, unspecified site: Secondary | ICD-10-CM

## 2023-11-17 DIAGNOSIS — I5020 Unspecified systolic (congestive) heart failure: Secondary | ICD-10-CM

## 2023-11-17 NOTE — Progress Notes (Signed)
Cardiology Office Note  Date: 11/17/2023   ID: Jeffrey Allen, Jeffrey Allen 1956/11/01, MRN [id number]  History of Present Illness: Jeffrey Allen is a 67 y.o. male last seen in June.  He is here today with his wife for a follow-up visit.  Reports no exertional chest pain or palpitations, no dizziness or syncope.  Generally NYHA class II dyspnea with typical activities.  He is scheduled for right carpal tunnel release under local anesthesia on December 6.  RCRI perioperative cardiac risk index is class III, 6.6% chance of major adverse cardiac event.  I reviewed his medications which he has self adjusted in the interim.  He stopped aspirin, potassium supplement, Zetia and amiodarone.  Current cardiac regimen includes Plavix, Repatha, and losartan.  He is due for follow-up lab work.  Physical Exam: VS:  BP 138/82   Pulse 64   Ht 5\' 7"  (1.702 m)   Wt 199 lb 9.6 oz (90.5 kg)   SpO2 94%   BMI 31.26 kg/m , BMI Body mass index is 31.26 kg/m.  Wt Readings from Last 3 Encounters:  11/17/23 199 lb 9.6 oz (90.5 kg)  09/12/23 200 lb 12.8 oz (91.1 kg)  05/31/23 197 lb 12.8 oz (89.7 kg)    General: Patient appears comfortable at rest. HEENT: Conjunctiva and lids normal. Neck: Supple, no elevated JVP or carotid bruits. Lungs: Clear to auscultation, nonlabored breathing at rest. Cardiac: Regular rate and rhythm with ectopy, no S3 or significant systolic murmur. Extremities: No pitting edema.  ECG:  An ECG dated 02/19/2023 was personally reviewed today and demonstrated:  Sinus rhythm with ventricular bigeminy, low voltage.  Labwork: 01/30/2023: Magnesium 2.3; TSH 1.244 02/19/2023: ALT 19; AST 22; BUN 32; Creatinine, Ser 1.30; Hemoglobin 14.9; Platelets 186; Potassium 3.3; Sodium 139     Component Value Date/Time   CHOL 208 (H) 02/09/2023 0842   CHOL 230 (H) 01/04/2023 0819   TRIG 154 (H) 02/09/2023 0842   HDL 54 02/09/2023 0842   HDL 63 01/04/2023 0819   CHOLHDL 3.9 02/09/2023 0842   VLDL  31 02/09/2023 0842   LDLCALC 123 (H) 02/09/2023 0842   LDLCALC 144 (H) 01/04/2023 0819   LDLCALC 162 (H) 02/16/2021 0940   Other Studies Reviewed Today:  Echocardiogram 11/06/2023:  1. Left ventricular ejection fraction, by estimation, is 45 to 50%. The  left ventricle has mildly decreased function. The left ventricle  demonstrates global hypokinesis. The left ventricular internal cavity size  was mildly dilated. Left ventricular  diastolic parameters were normal.   2. Right ventricular systolic function is normal. The right ventricular  size is normal.   3. Left atrial size was moderately dilated.   4. The mitral valve is abnormal. Trivial mitral valve regurgitation. No  evidence of mitral stenosis.   5. The aortic valve is tricuspid. Aortic valve regurgitation is not  visualized. No aortic stenosis is present.   6. The inferior vena cava is normal in size with greater than 50%  respiratory variability, suggesting right atrial pressure of 3 mmHg.   Assessment and Plan:  1.  Preoperative cardiac evaluation prior to planned right carpal tunnel release under local anesthesia.  Perioperative risk would be intermediate in setting of general anesthesia, lower with current planned operation.  He does not require any further cardiac testing at this time.  If necessary to hold Plavix, this would need to be 5 days prior to operation.  2.  Multivessel CAD status post DES to the mid to distal LAD,  DES to the first diagonal, and DES x 2 to the distal circumflex in June 2023.  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  He is taking Plavix and Repatha, denies any interval nitroglycerin use.   3.  HFmrEF, LVEF stable at 45 to 50% by recent follow-up echocardiogram in November of this year.  He is on Cozaar.  Has generally preferred a simplified medical regimen.  Check BMET.   4.  Symptomatic PVCs, improved on low-dose amiodarone 100  mg every other day.  He stopped amiodarone in the interim questioning whether he needed to stay on this or not.  He is hesitant to resume now, plan to obtain 72-hour Zio patch for requantification of PVCs as he may still need to be on antiarrhythmic therapy.   5.  Mixed hyperlipidemia.  He has a history of statin myalgias.  LDL 123 in February.  Currently on Repatha.  Check FLP.  Disposition:  Follow up  3 months.  Signed, Jonelle Sidle, M.D., F.A.C.C. [facility]

## 2023-11-17 NOTE — Patient Instructions (Signed)
Medication Instructions:   Your physician recommends that you continue on your current medications as directed. Please refer to the Current Medication list given to you today.   Labwork:  Fasting Lipids,BMET  Testing/Procedures: ZIO XT- Long Term Monitor Instructions   Your physician has requested you wear your ZIO patch monitor___3____days.   This is a single patch monitor.  [employer] supplies one patch monitor per enrollment.  Additional stickers are not available.   Do not shower for the first 24 hours.  You may shower after the first 24 hours.   Call [facility] Customer Care at [phone] if you have questions regarding your ZIO XT patch monitor.  Call them immediately if you see an orange light blinking on your monitor.   If your monitor falls off in less than 4 days contact our Monitor department at [phone].  If your monitor becomes loose or falls off after 4 days call [employer] at [phone] for suggestions on securing your monitor.    Follow-Up: 3 months  Any Other Special Instructions Will Be Listed Below (If Applicable).  If you need a refill on your cardiac medications before your next appointment, please call your pharmacy.

## 2023-11-27 DIAGNOSIS — I493 Ventricular premature depolarization: Secondary | ICD-10-CM | POA: Diagnosis not present

## 2023-11-28 ENCOUNTER — Other Ambulatory Visit (HOSPITAL_COMMUNITY)
Admission: RE | Admit: 2023-11-28 | Discharge: 2023-11-28 | Disposition: A | Discharge status: Home / Self Care | Payer: Medicare Other | Source: Ambulatory Visit | Attending: Cardiology | Admitting: Cardiology

## 2023-11-28 ENCOUNTER — Telehealth: Payer: Self-pay | Admitting: Cardiology

## 2023-11-28 DIAGNOSIS — I2510 Atherosclerotic heart disease of native coronary artery without angina pectoris: Secondary | ICD-10-CM | POA: Diagnosis not present

## 2023-11-28 LAB — HEPATIC FUNCTION PANEL
ALT: 16 U/L (ref 0–44)
AST: 17 U/L (ref 15–41)
Albumin: 4 g/dL (ref 3.5–5.0)
Alkaline Phosphatase: 59 U/L (ref 38–126)
Bilirubin, Direct: 0.1 mg/dL (ref 0.0–0.2)
Indirect Bilirubin: 0.6 mg/dL (ref 0.3–0.9)
Total Bilirubin: 0.7 mg/dL (ref <1.2)
Total Protein: 7.1 g/dL (ref 6.5–8.1)

## 2023-11-28 LAB — LIPID PANEL
Cholesterol: 151 mg/dL (ref 0–200)
HDL: 47 mg/dL (ref >40)
LDL Cholesterol: 89 mg/dL (ref 0–99)
Total CHOL/HDL Ratio: 3.2 {ratio}
Triglycerides: 76 mg/dL (ref <150)
VLDL: 15 mg/dL (ref 0–40)

## 2023-11-28 MED ORDER — AMIODARONE HCL 200 MG PO TABS: ORAL_TABLET | ORAL | 3 refills | Status: DC

## 2023-11-28 NOTE — Telephone Encounter (Signed)
-----   Message from Nona Dell sent at 11/27/2023  4:03 PM EST ----- Results reviewed.  Please let him know that the follow-up monitor shows frequent PVCs and multiple episodes of NSVT.  As per our recent discussion in the office, I would suggest that he go back on amiodarone at prior dose for suppression of ventricular ectopy as before.

## 2023-11-28 NOTE — Telephone Encounter (Signed)
Patient notified and verbalized understanding. Pt is having carpal tunnel surgery Friday 12/01/23, and patient is agreeable to starting medication until after surgery. Pt would like to re-visit ablation conversation with provider.    FYI

## 2023-11-28 NOTE — Telephone Encounter (Signed)
Patient's wife is returning phone call in regards to long term heart monitor results. Please advise.

## 2023-11-28 NOTE — Telephone Encounter (Signed)
Wife informed and verbalized understanding. Per wife, patient was taking amiodarone 100 mg every other day and wanted to confirm this is the dose he should restart? Copy sent to PCP.

## 2023-11-29 NOTE — Telephone Encounter (Signed)
Wife informed and verbalized understanding of plan. 

## 2023-12-01 DIAGNOSIS — I493 Ventricular premature depolarization: Secondary | ICD-10-CM | POA: Diagnosis not present

## 2023-12-01 DIAGNOSIS — I440 Atrioventricular block, first degree: Secondary | ICD-10-CM | POA: Diagnosis not present

## 2023-12-01 DIAGNOSIS — G5601 Carpal tunnel syndrome, right upper limb: Secondary | ICD-10-CM | POA: Diagnosis not present

## 2023-12-06 ENCOUNTER — Other Ambulatory Visit: Payer: Self-pay

## 2023-12-06 MED ORDER — CLOPIDOGREL BISULFATE 75 MG PO TABS: 75.0000 mg | ORAL_TABLET | Freq: Every day | ORAL | 1 refills | Status: DC

## 2023-12-12 DIAGNOSIS — Z8673 Personal history of transient ischemic attack (TIA), and cerebral infarction without residual deficits: Secondary | ICD-10-CM | POA: Diagnosis not present

## 2023-12-12 DIAGNOSIS — E7801 Familial hypercholesterolemia: Secondary | ICD-10-CM | POA: Diagnosis not present

## 2023-12-12 DIAGNOSIS — K219 Gastro-esophageal reflux disease without esophagitis: Secondary | ICD-10-CM | POA: Diagnosis not present

## 2023-12-12 DIAGNOSIS — Z23 Encounter for immunization: Secondary | ICD-10-CM | POA: Diagnosis not present

## 2023-12-12 DIAGNOSIS — G720 Drug-induced myopathy: Secondary | ICD-10-CM | POA: Diagnosis not present

## 2023-12-15 DIAGNOSIS — G5603 Carpal tunnel syndrome, bilateral upper limbs: Secondary | ICD-10-CM | POA: Diagnosis not present

## 2023-12-15 DIAGNOSIS — M720 Palmar fascial fibromatosis [Dupuytren]: Secondary | ICD-10-CM | POA: Diagnosis not present

## 2023-12-25 DIAGNOSIS — R059 Cough, unspecified: Secondary | ICD-10-CM | POA: Diagnosis not present

## 2023-12-25 DIAGNOSIS — J22 Unspecified acute lower respiratory infection: Secondary | ICD-10-CM | POA: Diagnosis not present

## 2023-12-25 DIAGNOSIS — J019 Acute sinusitis, unspecified: Secondary | ICD-10-CM | POA: Diagnosis not present

## 2023-12-25 DIAGNOSIS — R0981 Nasal congestion: Secondary | ICD-10-CM | POA: Diagnosis not present

## 2024-01-08 DIAGNOSIS — K080 Exfoliation of teeth due to systemic causes: Secondary | ICD-10-CM | POA: Diagnosis not present

## 2024-01-17 DIAGNOSIS — G5603 Carpal tunnel syndrome, bilateral upper limbs: Secondary | ICD-10-CM | POA: Diagnosis not present

## 2024-01-22 DIAGNOSIS — M25461 Effusion, right knee: Secondary | ICD-10-CM | POA: Diagnosis not present

## 2024-01-22 DIAGNOSIS — M25561 Pain in right knee: Secondary | ICD-10-CM | POA: Diagnosis not present

## 2024-01-26 DIAGNOSIS — M25561 Pain in right knee: Secondary | ICD-10-CM | POA: Diagnosis not present

## 2024-02-02 DIAGNOSIS — M25561 Pain in right knee: Secondary | ICD-10-CM | POA: Diagnosis not present

## 2024-02-22 DIAGNOSIS — G5603 Carpal tunnel syndrome, bilateral upper limbs: Secondary | ICD-10-CM | POA: Diagnosis not present

## 2024-02-22 DIAGNOSIS — M720 Palmar fascial fibromatosis [Dupuytren]: Secondary | ICD-10-CM | POA: Diagnosis not present

## 2024-02-28 ENCOUNTER — Ambulatory Visit: Payer: Medicare Other | Attending: Student | Admitting: Student

## 2024-02-28 ENCOUNTER — Encounter: Payer: Self-pay | Admitting: Student

## 2024-02-28 VITALS — BP 122/82 | HR 56 | Ht 67.0 in | Wt 192.0 lb

## 2024-02-28 DIAGNOSIS — E782 Mixed hyperlipidemia: Secondary | ICD-10-CM

## 2024-02-28 DIAGNOSIS — I493 Ventricular premature depolarization: Secondary | ICD-10-CM

## 2024-02-28 DIAGNOSIS — I10 Essential (primary) hypertension: Secondary | ICD-10-CM | POA: Diagnosis not present

## 2024-02-28 DIAGNOSIS — I2510 Atherosclerotic heart disease of native coronary artery without angina pectoris: Secondary | ICD-10-CM

## 2024-02-28 DIAGNOSIS — I5022 Chronic systolic (congestive) heart failure: Secondary | ICD-10-CM | POA: Diagnosis not present

## 2024-02-28 NOTE — Patient Instructions (Signed)
 Medication Instructions:  Your physician recommends that you continue on your current medications as directed. Please refer to the Current Medication list given to you today.   Labwork: None today  Testing/Procedures: None today  Follow-Up: 6 months with Eloise Harman or Dr.McDowell  Any Other Special Instructions Will Be Listed Below (If Applicable).  If you need a refill on your cardiac medications before your next appointment, please call your pharmacy.

## 2024-02-28 NOTE — Progress Notes (Signed)
 Cardiology Office Note    Date:  02/28/2024  ID:  Jeffrey, Allen [birth year], MRN [id number] Cardiologist: Nona Dell, MD    History of Present Illness:    Jeffrey Allen is a 68 y.o. male with past medical history of CAD (s/p cath on 05/26/2022 showing severe 3-vessel CAD with DESx1 to mid/distal LAD, DESx1 to D1 and DESx2 to distal LCx), chronic HFmrEF, PVC's, HTN, HLD, prediabetes and prior CVA who presents to the office today for 19-month follow-up.  He was last examined by Dr. Diona Browner in 10/2023 and reported NYHA class II dyspnea but denied any chest pain or palpitations. He was cleared to proceed with upcoming carpal tunnel release surgery. He had previously stopped ASA due to frequent bruising but was continued on Plavix and Repatha. He was also previously not open to additional GDMT for his cardiomyopathy and was continued on Losartan 25 mg daily. Did not require diuretic therapy.  In talking with the patient and his wife today, he reports overall feeling well since his last office visit. His activity was more limited around the time that he underwent carpal tunnel surgery but he is now back to his usual activities. Has been doing carpentry work and carrying heavy objects. He denies any recent chest pain or dyspnea on exertion with this. No recent palpitations, orthopnea, PND or pitting edema.  Studies Reviewed:   EKG: EKG is not ordered today. EKG from 11/17/2023 is reviewed and shows normal sinus rhythm, heart rate 64 with first-degree AV block and PVC's.  Cardiac Catheterization: 05/2022   Ost RCA to Prox RCA lesion is 50% stenosed.   Mid Cx lesion is 99% stenosed.   Prox Cx lesion is 70% stenosed.   1st Diag lesion is 80% stenosed.   Dist LAD lesion is 90% stenosed.   A drug-eluting stent was successfully placed using a STENT ONYX FRONTIER 2.25X18.   A drug-eluting stent was successfully placed using a STENT ONYX FRONTIER 3.0X12.   A drug-eluting stent was successfully  placed using a SYNERGY XD 2.25X16.   A drug-eluting stent was successfully placed using a SYNERGY XD 2.25X12.   Post intervention, there is a 0% residual stenosis.   Post intervention, there is a 0% residual stenosis.   Post intervention, there is a 0% residual stenosis.   Post intervention, there is a 0% residual stenosis.   Severe mid to distal LAD stenosis. Successful PTCA/DES x 1 mid to distal LAD Severe Diagonal 1 stenosis. Successful PTCA/DES x 1 Diagonal Severe distal Circumflex stenosis. Successful PTCA/DES x 2 distal Circumflex Moderate ostial RCA stenosis. Large dominant RCA   Recommendations: Continue DAPT with ASA and Brilinta for one year. Continue statin and beta blocker.   Echocardiogram: 10/2023 IMPRESSIONS     1. Left ventricular ejection fraction, by estimation, is 45 to 50%. The  left ventricle has mildly decreased function. The left ventricle  demonstrates global hypokinesis. The left ventricular internal cavity size  was mildly dilated. Left ventricular  diastolic parameters were normal.   2. Right ventricular systolic function is normal. The right ventricular  size is normal.   3. Left atrial size was moderately dilated.   4. The mitral valve is abnormal. Trivial mitral valve regurgitation. No  evidence of mitral stenosis.   5. The aortic valve is tricuspid. Aortic valve regurgitation is not  visualized. No aortic stenosis is present.   6. The inferior vena cava is normal in size with greater than 50%  respiratory variability, suggesting right  atrial pressure of 3 mmHg.    Event Monitor: 11/2023 ZIO monitor reviewed.  3 days, 3 hours analyzed.   Predominant rhythm is sinus with heart rate ranging from 35 bpm up to 111 bpm and average heart rate 73 bpm. There were occasional PACs representing 2.7% total beats with otherwise rare atrial couplets and triplets. There were frequent PVCs representing 14.2% total beats.  Also frequent ventricular couplets  representing 5.4% total beats.  Otherwise, there were rare ventricular couplets including episodes of ventricular bigeminy and trigeminy. Multiple episodes of NSVT were noted (179) with the longest event lasting 11 beats.  No sustained ventricular events. No pauses or high degree heart block.   Physical Exam:   VS:  BP 122/82 (BP Location: Right Arm, Patient Position: Sitting, Cuff Size: Normal)   Pulse (!) 56   Ht 5\' 7"  (1.702 m)   Wt 192 lb (87.1 kg)   SpO2 95%   BMI 30.07 kg/m    Wt Readings from Last 3 Encounters:  02/28/24 192 lb (87.1 kg)  11/17/23 199 lb 9.6 oz (90.5 kg)  09/12/23 200 lb 12.8 oz (91.1 kg)     GEN: Well nourished, well developed male appearing in no acute distress NECK: No JVD; No carotid bruits CARDIAC: RRR with occasional ectopic beats, no murmurs, rubs, gallops RESPIRATORY:  Clear to auscultation without rales, wheezing or rhonchi  ABDOMEN: Appears non-distended. No obvious abdominal masses. EXTREMITIES: No clubbing or cyanosis. No pitting edema.  Distal pedal pulses are 2+ bilaterally.   Assessment and Plan:   1. CAD - Prior cardiac catheterization in 05/2022 showed severe three-vessel CAD and he underwent PCI with DESx1 to mid/distal LAD, DESx1 to D1 and DESx2 to distal LCx. He remains active at baseline and denies any recent anginal symptoms. He has been continued on Plavix 75 mg daily given his stent burden. Continue Repatha and Losartan 25 mg daily. Previously intolerant to statins and not on a beta-blocker due to bradycardia.  2. Chronic HFmrEF - His EF was at 45 to 50% by echocardiogram in 10/2023 which is overall similar to prior imaging. He has remained on Losartan 25 mg daily. Previously not interested in additional GDMT and wishes to keep a simplified medication regimen. He has not been on a beta-blocker due to bradycardia.  3. HTN - BP is well-controlled at 122/82 during today's visit. Continue Losartan 25 mg daily.   4. HLD - LDL was at  89 when checked in 11/2023. He has remained on Repatha as he was previously intolerant to statin therapy.  5. PVC's - Most recent monitor in 11/2023 showed that his PVC burden was at 14.2%. He has been on Amiodarone 100 mg every other day. He is unsure if he wants to continue this long-term or switch to a different option. By review of EP notes, it was recommended to continue low-dose Amiodarone and if intolerances in the future, could potentially start Mexiletine or consider PVC ablation in the future but all medications would need to be stopped. Reviewed possible referral back to EP today but he wishes to continue his current regimen for now. Encouraged him to reach out in the interim if wishing to pursue this. LFT's were WNL in 11/2023 and will request a copy of his recent labs from his PCP to make sure TSH has been obtained (was WNL in 01/2023).  Signed, Ellsworth Lennox, PA-C

## 2024-03-06 DIAGNOSIS — K219 Gastro-esophageal reflux disease without esophagitis: Secondary | ICD-10-CM | POA: Diagnosis not present

## 2024-03-06 DIAGNOSIS — Z131 Encounter for screening for diabetes mellitus: Secondary | ICD-10-CM | POA: Diagnosis not present

## 2024-03-06 DIAGNOSIS — E7801 Familial hypercholesterolemia: Secondary | ICD-10-CM | POA: Diagnosis not present

## 2024-03-09 ENCOUNTER — Ambulatory Visit
Admission: EM | Admit: 2024-03-09 | Discharge: 2024-03-09 | Disposition: A | Discharge status: Home / Self Care | Attending: Nurse Practitioner | Admitting: Nurse Practitioner

## 2024-03-09 ENCOUNTER — Encounter: Payer: Self-pay | Admitting: Emergency Medicine

## 2024-03-09 DIAGNOSIS — J209 Acute bronchitis, unspecified: Secondary | ICD-10-CM

## 2024-03-09 LAB — POC COVID19/FLU A&B COMBO
Covid Antigen, POC: NEGATIVE
Influenza A Antigen, POC: NEGATIVE
Influenza B Antigen, POC: NEGATIVE

## 2024-03-09 MED ORDER — METHYLPREDNISOLONE SODIUM SUCC 125 MG IJ SOLR
125.0000 mg | Freq: Once | INTRAMUSCULAR | Status: AC
  Administered 2024-03-09: 125 mg via INTRAMUSCULAR

## 2024-03-09 MED ORDER — PROMETHAZINE-DM 6.25-15 MG/5ML PO SYRP: 5.0000 mL | ORAL_SOLUTION | Freq: Four times a day (QID) | ORAL | 0 refills | Status: AC | PRN

## 2024-03-09 MED ORDER — PREDNISONE 20 MG PO TABS: 40.0000 mg | ORAL_TABLET | Freq: Every day | ORAL | 0 refills | Status: AC

## 2024-03-09 MED ORDER — ALBUTEROL SULFATE HFA 108 (90 BASE) MCG/ACT IN AERS: 2.0000 | INHALATION_SPRAY | Freq: Four times a day (QID) | RESPIRATORY_TRACT | 0 refills | Status: AC | PRN

## 2024-03-09 NOTE — ED Provider Notes (Signed)
 [facility]    CSN: [id number] Arrival date & time: 03/09/24  1030      History   Chief Complaint No chief complaint on file.   HPI Jeffrey Allen is a 68 y.o. male.   The history is provided by the patient.   Patient presents for complaints of cough and headache that been present over the past 2 days.  Patient states prior to his symptoms starting, he inhaled corn dust.  He states after the inhalation, his symptoms started.  States he heard himself wheezing last night.  He states symptoms have worsened over the past 24 hours.  Denies fever, chills, chest pain, abdominal pain, nausea, vomiting, diarrhea, or rash.  Patient states he was taking over-the-counter Mucinex and NyQuil for his symptoms.  Past Medical History:  Diagnosis Date   Bradycardia    CAD (coronary artery disease)    a. 05/2022: DES placement x1 to the mid/distal LAD, DES placement x1 to D1 and DES placement x2 to the distal LCx   GERD (gastroesophageal reflux disease)    Gout    Heart failure with mildly reduced ejection fraction (HFmrEF) (HCC)    High cholesterol    NSVT (nonsustained ventricular tachycardia) (HCC)    Obesity (BMI 30.0-34.9)    Peptic ulcer disease    Prediabetes    PVC's (premature ventricular contractions)    Stroke (HCC) 06/2017   SVT (supraventricular tachycardia) (HCC)    Ureterolithiasis     Patient Active Problem List   Diagnosis Date Noted   CAD S/P percutaneous coronary angioplasty/Multi-Vessel CAD/Stents 05/26/22 06/26/2022   Acute exacerbation of CHF (congestive heart failure) (HCC) 06/23/2022   PVC (premature ventricular contraction)/PACs 05/27/2022   Unstable angina (HCC)    Diverticulitis of colon 10/07/2020   History of colonic polyps 10/07/2020   Vitamin D deficiency 02/10/2020   Chest pain 12/30/2019   COVID-19 virus infection 12/30/2019   NSTEMI (non-ST elevated myocardial infarction) (HCC) 12/29/2019   Prediabetes 11/07/2019   Hypogonadism, male  11/07/2019   Obesity (BMI 30.0-34.9) 11/07/2019   Stroke (HCC)    Essential hypertension 06/19/2018   Mixed hyperlipidemia 06/19/2018   Gout 06/18/2018   GERD (gastroesophageal reflux disease) 06/18/2018   Acute CVA (cerebrovascular accident) (HCC) 06/18/2018   CARPAL TUNNEL SYNDROME 04/06/2009   CUBITAL TUNNEL SYNDROME 04/06/2009   NECK PAIN, CHRONIC 04/06/2009   Disturbance of skin sensation 04/06/2009   Sprain of ankle 12/03/2008    Past Surgical History:  Procedure Laterality Date   BIOPSY  11/25/2020   Procedure: BIOPSY;  Surgeon: Corbin Ade, MD;  Location: [facility];  Service: Endoscopy;;  gastric   COLONOSCOPY     COLONOSCOPY N/A 12/07/2016   Scattered small and large-mouthed diverticula in sigmoid and descending colon. Six semi-pedunculated polyps in sigmoid and ascending colon, 4-6 mm in size. Distal TI normal appearing. Hyperplastic and tubular adenomas. 3 year surveillance recommended.    COLONOSCOPY N/A 11/25/2020   diverticulosis in sigmoid and descending colon, three 3-4 mm polyps at rectosigmoid colon and hepatic flexure. Hyperplastic polyp. 5 year colonoscopy.    CORONARY STENT INTERVENTION N/A 05/26/2022   Procedure: CORONARY STENT INTERVENTION;  Surgeon: Kathleene Hazel, MD;  Location: [facility];  Service: Cardiovascular;  Laterality: N/A;   ESOPHAGOGASTRODUODENOSCOPY N/A 11/25/2020     normal esophagus, portal gastropathy, normal duodenum. Mild chronic gastritis, negative H.pylori.    LEFT HEART CATH AND CORS/GRAFTS ANGIOGRAPHY N/A 05/26/2022   Procedure: LEFT HEART CATH AND CORS/GRAFTS ANGIOGRAPHY;  Surgeon: Kathleene Hazel, MD;  Location: [facility];  Service: Cardiovascular;  Laterality: N/A;   POLYPECTOMY  11/25/2020   Procedure: POLYPECTOMY;  Surgeon: Corbin Ade, MD;  Location: [facility];  Service: Endoscopy;;   TOOTH EXTRACTION         Home Medications    Prior to Admission medications   Medication  Sig Start Date End Date Taking? Authorizing Provider  albuterol (VENTOLIN HFA) 108 (90 Base) MCG/ACT inhaler Inhale 2 puffs into the lungs every 6 (six) hours as needed. 03/09/24  Yes Leath-Warren, Sadie Haber, NP  predniSONE (DELTASONE) 20 MG tablet Take 2 tablets (40 mg total) by mouth daily with breakfast for 5 days. 03/09/24 03/14/24 Yes Leath-Warren, Sadie Haber, NP  promethazine-dextromethorphan (PROMETHAZINE-DM) 6.25-15 MG/5ML syrup Take 5 mLs by mouth 4 (four) times daily as needed. 03/09/24  Yes Leath-Warren, Sadie Haber, NP  allopurinol (ZYLOPRIM) 300 MG tablet Take 0.5 tablets (150 mg total) by mouth daily. 05/18/21   Wilson Singer, MD  amiodarone (PACERONE) 200 MG tablet Take 100 mg (0.5 tablet) by mouth every other day. 11/28/23   Jonelle Sidle, MD  clopidogrel (PLAVIX) 75 MG tablet Take 1 tablet (75 mg total) by mouth daily. 12/06/23   Jonelle Sidle, MD  Evolocumab (REPATHA) 140 MG/ML SOSY Inject into the skin.    [provider]  gabapentin (NEURONTIN) 100 MG capsule TAKE 1 CAPSULE BY MOUTH THREE TIMES A DAY. Patient taking differently: Take 100 mg by mouth as needed. 09/22/21   Anabel Halon, MD  losartan (COZAAR) 25 MG tablet Take 1 tablet (25 mg total) by mouth daily. 07/19/22   Strader, Lennart Pall, PA-C  nitroGLYCERIN (NITROSTAT) 0.4 MG SL tablet Place 1 tablet (0.4 mg total) under the tongue every 5 (five) minutes as needed for chest pain. 05/27/22   Arty Baumgartner, NP  pantoprazole (PROTONIX) 40 MG tablet Take 1 tablet (40 mg total) by mouth daily. 07/14/21   Wilson Singer, MD    Family History Family History  Problem Relation Age of Onset   Dementia Mother    Hypertension Father    Diabetes Father    COPD Father    Hyperlipidemia Father    Heart attack Father    Colon cancer Neg Hx     Social History Social History   Tobacco Use   Smoking status: Former    Current packs/day: 0.00    Average packs/day: (0.5 ttl pk-yrs)    Types: Cigarettes     Start date: 12/26/1981    Quit date: 12/26/2006    Years since quitting: 17.2   Smokeless tobacco: Never  Vaping Use   Vaping status: Never Used  Substance Use Topics   Alcohol use: Not Currently   Drug use: Not Currently    Types: Marijuana     Allergies   Prednisone and Statins   Review of Systems Review of Systems Per HPI  Physical Exam Triage Vital Signs ED Triage Vitals  Encounter Vitals Group     BP 03/09/24 1054 (!) 150/89     Systolic BP Percentile --      Diastolic BP Percentile --      Pulse Rate 03/09/24 1054 67     Resp 03/09/24 1054 18     Temp 03/09/24 1054 98.3 F (36.8 C)     Temp Source 03/09/24 1054 Oral     SpO2 03/09/24 1054 96 %     Weight --  Height --      Head Circumference --      Peak Flow --      Pain Score 03/09/24 1057 4     Pain Loc --      Pain Education --      Exclude from Growth Chart --    No data found.  Updated Vital Signs BP (!) 150/89 (BP Location: Right Arm)   Pulse 67   Temp 98.3 F (36.8 C) (Oral)   Resp 18   SpO2 96%   Visual Acuity Right Eye Distance:   Left Eye Distance:   Bilateral Distance:    Right Eye Near:   Left Eye Near:    Bilateral Near:     Physical Exam Vitals and nursing note reviewed.  Constitutional:      General: He is not in acute distress.    Appearance: Normal appearance.  HENT:     Head: Normocephalic.     Mouth/Throat:     Mouth: Mucous membranes are moist.  Eyes:     Extraocular Movements: Extraocular movements intact.     Conjunctiva/sclera: Conjunctivae normal.     Pupils: Pupils are equal, round, and reactive to light.  Cardiovascular:     Rate and Rhythm: Normal rate and regular rhythm.     Pulses: Normal pulses.     Heart sounds: Normal heart sounds.  Pulmonary:     Effort: Pulmonary effort is normal. No respiratory distress.     Breath sounds: Normal breath sounds. No stridor. No wheezing, rhonchi or rales.  Abdominal:     General: Bowel sounds are normal.      Palpations: Abdomen is soft.     Tenderness: There is no abdominal tenderness.  Musculoskeletal:     Cervical back: Normal range of motion.  Lymphadenopathy:     Cervical: No cervical adenopathy.  Skin:    General: Skin is warm and dry.  Neurological:     General: No focal deficit present.     Mental Status: He is alert and oriented to person, place, and time.  Psychiatric:        Mood and Affect: Mood normal.        Behavior: Behavior normal.     UC Treatments / Results  Labs (all labs ordered are listed, but only abnormal results are displayed) Labs Reviewed  POC COVID19/FLU A&B COMBO    EKG   Radiology No results found.  Procedures Procedures (including critical care time)  Medications Ordered in UC Medications  methylPREDNISolone sodium succinate (SOLU-MEDROL) 125 mg/2 mL injection 125 mg (has no administration in time range)    Initial Impression / Assessment and Plan / UC Course  I have reviewed the triage vital signs and the nursing notes.  Pertinent labs & imaging results that were available during my care of the patient were reviewed by me and considered in my medical decision making (see chart for details).  COVID/flu test was negative.  On exam, lung sounds are clear throughout, room air sats at 96%.  Imaging is not indicated at this time.  Symptoms are consistent with acute bronchitis.  Solu-Medrol 125 mg IM and ministered in the clinic.  Start prednisone 40 mg for the next 5 days, along with an albuterol inhaler as needed for wheezing or shortness of breath, and Promethazine DM for the cough.  Supportive care recommendations were provided and discussed with the patient to include fluids, rest, and use of a humidifier during sleep.  Discussed indications regarding  follow-up.  Patient was in agreement with this plan of care and verbalizes understanding.  All questions were answered.  Patient stable for discharge.  Final Clinical Impressions(s) / UC Diagnoses    Final diagnoses:  Acute bronchitis, unspecified organism     Discharge Instructions      The COVID/flu test was negative. You were given an injection of Solu-Medrol 125 mg.  Begin the prednisone on 03/10/2024. Take medication as prescribed.  During the day, recommend over-the-counter Coricidin HBP to help with your cough. Increase fluids and allow for plenty of rest. Recommend use of a humidifier in your bedroom at nighttime during sleep and sleeping elevated on pillows while cough symptoms persist. As discussed, your cough may last from days to weeks.  If you continue to have a persistent nagging cough, but are generally feeling well, continue over-the-counter cough medications, increasing fluids, and use of drops.  If you begin to experience wheezing, feel short of breath, or other concerns, please follow-up in this clinic or with your primary care physician for further evaluation. Follow-up as needed.      ED Prescriptions     Medication Sig Dispense Auth. Provider   predniSONE (DELTASONE) 20 MG tablet Take 2 tablets (40 mg total) by mouth daily with breakfast for 5 days. 10 tablet Leath-Warren, Sadie Haber, NP   albuterol (VENTOLIN HFA) 108 (90 Base) MCG/ACT inhaler Inhale 2 puffs into the lungs every 6 (six) hours as needed. 8 g Leath-Warren, Sadie Haber, NP   promethazine-dextromethorphan (PROMETHAZINE-DM) 6.25-15 MG/5ML syrup Take 5 mLs by mouth 4 (four) times daily as needed. 118 mL Leath-Warren, Sadie Haber, NP      PDMP not reviewed this encounter.   Abran Cantor, NP 03/09/24 1128

## 2024-03-09 NOTE — Discharge Instructions (Signed)
 The COVID/flu test was negative. You were given an injection of Solu-Medrol 125 mg.  Begin the prednisone on 03/10/2024. Take medication as prescribed.  During the day, recommend over-the-counter Coricidin HBP to help with your cough. Increase fluids and allow for plenty of rest. Recommend use of a humidifier in your bedroom at nighttime during sleep and sleeping elevated on pillows while cough symptoms persist. As discussed, your cough may last from days to weeks.  If you continue to have a persistent nagging cough, but are generally feeling well, continue over-the-counter cough medications, increasing fluids, and use of drops.  If you begin to experience wheezing, feel short of breath, or other concerns, please follow-up in this clinic or with your primary care physician for further evaluation. Follow-up as needed.

## 2024-03-09 NOTE — ED Triage Notes (Signed)
 Runny nose of Tuesday.  Cough and headache started on Wednesday.  States symptoms have progressively gotten worse.  States cough is productive at times now and hurts to cough.

## 2024-03-13 ENCOUNTER — Encounter: Payer: Self-pay | Admitting: Internal Medicine

## 2024-03-13 DIAGNOSIS — Z8673 Personal history of transient ischemic attack (TIA), and cerebral infarction without residual deficits: Secondary | ICD-10-CM | POA: Diagnosis not present

## 2024-03-13 DIAGNOSIS — Z6830 Body mass index (BMI) 30.0-30.9, adult: Secondary | ICD-10-CM | POA: Diagnosis not present

## 2024-03-13 DIAGNOSIS — E7800 Pure hypercholesterolemia, unspecified: Secondary | ICD-10-CM | POA: Diagnosis not present

## 2024-03-13 DIAGNOSIS — I2510 Atherosclerotic heart disease of native coronary artery without angina pectoris: Secondary | ICD-10-CM | POA: Diagnosis not present

## 2024-03-13 DIAGNOSIS — Z8739 Personal history of other diseases of the musculoskeletal system and connective tissue: Secondary | ICD-10-CM | POA: Diagnosis not present

## 2024-03-20 ENCOUNTER — Encounter: Payer: Self-pay | Admitting: Pharmacy Technician

## 2024-03-20 ENCOUNTER — Telehealth: Payer: Self-pay | Admitting: Student

## 2024-03-20 ENCOUNTER — Other Ambulatory Visit (HOSPITAL_COMMUNITY): Payer: Self-pay

## 2024-03-20 ENCOUNTER — Telehealth: Payer: Self-pay | Admitting: Pharmacy Technician

## 2024-03-20 DIAGNOSIS — J40 Bronchitis, not specified as acute or chronic: Secondary | ICD-10-CM | POA: Diagnosis not present

## 2024-03-20 DIAGNOSIS — Z6829 Body mass index (BMI) 29.0-29.9, adult: Secondary | ICD-10-CM | POA: Diagnosis not present

## 2024-03-20 NOTE — Telephone Encounter (Signed)
 Patient Advocate Encounter   The patient was approved for a [employer] grant that will help cover the cost of repatha Total amount awarded, 2500.00.  Effective: 02/19/24 - 02/17/25   [id number] [employer] ID: [id number]   Pharmacy provided with approval and processing information. Patient informed via mychart

## 2024-03-20 NOTE — Telephone Encounter (Signed)
 I will ask med assist to help patient with the assistance program.

## 2024-03-20 NOTE — Telephone Encounter (Signed)
 Patient would like [place] to prescribe the repatha. Not really understanding why he wants [place] too. She said she can't afford the copay of $450.

## 2024-03-27 DIAGNOSIS — K080 Exfoliation of teeth due to systemic causes: Secondary | ICD-10-CM | POA: Diagnosis not present

## 2024-05-16 DIAGNOSIS — G5603 Carpal tunnel syndrome, bilateral upper limbs: Secondary | ICD-10-CM | POA: Diagnosis not present

## 2024-05-16 DIAGNOSIS — G5621 Lesion of ulnar nerve, right upper limb: Secondary | ICD-10-CM | POA: Diagnosis not present

## 2024-06-20 ENCOUNTER — Encounter (HOSPITAL_COMMUNITY): Payer: Self-pay

## 2024-06-20 ENCOUNTER — Emergency Department (HOSPITAL_COMMUNITY)

## 2024-06-20 ENCOUNTER — Other Ambulatory Visit: Payer: Self-pay

## 2024-06-20 ENCOUNTER — Emergency Department (HOSPITAL_COMMUNITY)
Admission: EM | Admit: 2024-06-20 | Discharge: 2024-06-20 | Disposition: A | Discharge status: Home / Self Care | Attending: Student | Admitting: Student

## 2024-06-20 DIAGNOSIS — W312XXA Contact with powered woodworking and forming machines, initial encounter: Secondary | ICD-10-CM | POA: Diagnosis not present

## 2024-06-20 DIAGNOSIS — S61311A Laceration without foreign body of left index finger with damage to nail, initial encounter: Secondary | ICD-10-CM | POA: Insufficient documentation

## 2024-06-20 DIAGNOSIS — S61211A Laceration without foreign body of left index finger without damage to nail, initial encounter: Secondary | ICD-10-CM | POA: Diagnosis not present

## 2024-06-20 DIAGNOSIS — Z7902 Long term (current) use of antithrombotics/antiplatelets: Secondary | ICD-10-CM | POA: Insufficient documentation

## 2024-06-20 MED ORDER — LIDOCAINE HCL (PF) 2 % IJ SOLN
INTRAMUSCULAR | Status: AC
  Filled 2024-06-20: qty 5

## 2024-06-20 MED ORDER — TETANUS-DIPHTH-ACELL PERTUSSIS 5-2.5-18.5 LF-MCG/0.5 IM SUSY: 0.5000 mL | PREFILLED_SYRINGE | Freq: Once | INTRAMUSCULAR | Status: DC

## 2024-06-20 MED ORDER — HYDROCODONE-ACETAMINOPHEN 5-325 MG PO TABS: 1.0000 | ORAL_TABLET | Freq: Four times a day (QID) | ORAL | 0 refills | Status: AC | PRN

## 2024-06-20 NOTE — ED Triage Notes (Signed)
 Pt arrived via POV with a laceration to his left index finger. Pt reports the break on his saw failed and he cut the end of his finger. Pt reports he is on a blood thinner.

## 2024-06-20 NOTE — Discharge Instructions (Signed)
 This wound will need to heal and fill in and should without too much problem,  but I do recommend getting rechecked for any increased pain, swelling, redness or drainage of pus, all signs of infection.  Keep it covered , clean and dry, using the xeroform as the first layer to protect it and keep other layers of gauze from sticking.  Plan to see your primary MD in a week for a recheck.  I have placed one stitch, this will dissolve on its own and doesn't need to be removed.

## 2024-06-20 NOTE — ED Provider Notes (Signed)
 [facility] Provider Note   CSN: [id number] Arrival date & time: 06/20/24  1617     Patient presents with: Laceration   Jeffrey Allen is a 68 y.o. male presenting for treatment of a laceration which occurred just prior to arrival.  He was using a table saw and accidentally cut his distal left index finger.  He reports significant bleeding after the injury as he is on Plavix .  He has been able to control the bleeding with direct pressure.  He denies numbness in his distal finger, he is able to flex and extend the finger without difficulty.  He is unsure of his tetanus status, however upon review of chart he is current, less than 5 years since last tetanus update.   The history is provided by the patient and the spouse.       Prior to Admission medications   Medication Sig Start Date End Date Taking? Authorizing Provider  HYDROcodone -acetaminophen  (NORCO/VICODIN) 5-325 MG tablet Take 1 tablet by mouth every 6 (six) hours as needed. 06/20/24  Yes Ouida Abeyta, Mliss, PA-C  albuterol  (VENTOLIN  HFA) 108 (90 Base) MCG/ACT inhaler Inhale 2 puffs into the lungs every 6 (six) hours as needed. 03/09/24   Leath-Warren, Etta JINNY, NP  allopurinol  (ZYLOPRIM ) 300 MG tablet Take 0.5 tablets (150 mg total) by mouth daily. 05/18/21   Gosrani, Nimish C, MD  amiodarone  (PACERONE ) 200 MG tablet Take 100 mg (0.5 tablet) by mouth every other day. 11/28/23   Debera Jayson MATSU, MD  clopidogrel  (PLAVIX ) 75 MG tablet Take 1 tablet (75 mg total) by mouth daily. 12/06/23   Debera Jayson MATSU, MD  Evolocumab  (REPATHA ) 140 MG/ML SOSY Inject into the skin.    [provider]  gabapentin  (NEURONTIN ) 100 MG capsule TAKE 1 CAPSULE BY MOUTH THREE TIMES A DAY. Patient taking differently: Take 100 mg by mouth as needed. 09/22/21   Tobie Suzzane POUR, MD  losartan  (COZAAR ) 25 MG tablet Take 1 tablet (25 mg total) by mouth daily. 07/19/22   Strader, Laymon HERO, PA-C  nitroGLYCERIN   (NITROSTAT ) 0.4 MG SL tablet Place 1 tablet (0.4 mg total) under the tongue every 5 (five) minutes as needed for chest pain. 05/27/22   Henry Manuelita NOVAK, NP  pantoprazole  (PROTONIX ) 40 MG tablet Take 1 tablet (40 mg total) by mouth daily. 07/14/21   Gosrani, Nimish C, MD  promethazine -dextromethorphan (PROMETHAZINE -DM) 6.25-15 MG/5ML syrup Take 5 mLs by mouth 4 (four) times daily as needed. 03/09/24   Leath-Warren, Etta JINNY, NP    Allergies: Prednisone  and Statins    Review of Systems  Constitutional:  Negative for chills and fever.  Respiratory:  Negative for shortness of breath and wheezing.   Skin:  Positive for wound.  Neurological:  Negative for numbness.    Updated Vital Signs BP (!) 145/105   Pulse 80   Temp 97.6 F (36.4 C) (Oral)   Resp 18   Ht 5' 7 (1.702 m)   Wt 87.1 kg   SpO2 98%   BMI 30.07 kg/m   Physical Exam Constitutional:      Appearance: He is well-developed.  HENT:     Head: Normocephalic.   Cardiovascular:     Rate and Rhythm: Normal rate.  Pulmonary:     Effort: Pulmonary effort is normal.   Skin:    Findings: Laceration present.     Comments: 1 cm laceration left dorsal distal index finger, hemostatic.  There are 2 approximately 0.4 mm deep  avulsions in the linear pattern connected by a 2 to 3 mm laceration.  There is also a superficial avulsion of the proximal nail plate but it does not appear to communicate with the nail bed.   Neurological:     Mental Status: He is alert and oriented to person, place, and time.     Sensory: No sensory deficit.     (all labs ordered are listed, but only abnormal results are displayed) Labs Reviewed - No data to display  EKG: None  Radiology: No results found.  DG Finger Index Left Result Date: 06/20/2024 CLINICAL DATA:  Left index finger laceration. EXAM: LEFT INDEX FINGER 2+V COMPARISON:  None Available. FINDINGS: There is no evidence of fracture or dislocation. There is no evidence of arthropathy or  other focal bone abnormality. A superficial soft tissue defect is seen involving the distal aspect of the second left finger. IMPRESSION: Superficial soft tissue defect involving the distal aspect of the second left finger. Electronically Signed   By: Suzen Dials M.D.   On: 06/20/2024 17:48     Procedures   LACERATION REPAIR Performed by: Mliss Narrow Authorized by: Mliss Narrow Consent: Verbal consent obtained. Risks and benefits: risks, benefits and alternatives were discussed Consent given by: patient Patient identity confirmed: provided demographic data Prepped and Draped in normal sterile fashion Wound explored  Laceration Location: 1 cm,  mostly avulsion not amenable to suturing,  but central bridge laceration between avulsions   Laceration Length: 1cm  No Foreign Bodies seen or palpated  Anesthesia: digital block  Local anesthetic: lidocaine  2% without epinephrine  Anesthetic total: 4 ml  Irrigation method: syringe Amount of cleaning: standard  Skin closure: vicryl 4-0  Number of sutures: 1  Technique: simple interupted  Avulsions dressed using iodoform guaze and dressings.  Patient tolerance: Patient tolerated the procedure well with no immediate complications.   Medications Ordered in the ED  lidocaine  HCl (PF) (XYLOCAINE ) 2 % injection (  Given 06/20/24 1728)                                    Medical Decision Making Amount and/or Complexity of Data Reviewed Radiology: ordered.  Risk Prescription drug management.        Final diagnoses:  Laceration of left index finger without foreign body, nail damage status unspecified, initial encounter    ED Discharge Orders          Ordered    HYDROcodone -acetaminophen  (NORCO/VICODIN) 5-325 MG tablet  Every 6 hours PRN        06/20/24 1857               Jeramie Scogin, PA-C 06/22/24 2149    Kommor, Lum, MD 06/23/24 [id number]

## 2024-06-27 DIAGNOSIS — S6982XA Other specified injuries of left wrist, hand and finger(s), initial encounter: Secondary | ICD-10-CM | POA: Diagnosis not present

## 2024-06-27 DIAGNOSIS — G5621 Lesion of ulnar nerve, right upper limb: Secondary | ICD-10-CM | POA: Diagnosis not present

## 2024-06-27 DIAGNOSIS — M25532 Pain in left wrist: Secondary | ICD-10-CM | POA: Diagnosis not present

## 2024-06-27 DIAGNOSIS — G5603 Carpal tunnel syndrome, bilateral upper limbs: Secondary | ICD-10-CM | POA: Diagnosis not present

## 2024-07-11 DIAGNOSIS — G5603 Carpal tunnel syndrome, bilateral upper limbs: Secondary | ICD-10-CM | POA: Diagnosis not present

## 2024-07-11 DIAGNOSIS — M720 Palmar fascial fibromatosis [Dupuytren]: Secondary | ICD-10-CM | POA: Diagnosis not present

## 2024-08-05 DIAGNOSIS — M545 Low back pain, unspecified: Secondary | ICD-10-CM | POA: Diagnosis not present

## 2024-08-05 DIAGNOSIS — Z6830 Body mass index (BMI) 30.0-30.9, adult: Secondary | ICD-10-CM | POA: Diagnosis not present

## 2024-08-07 ENCOUNTER — Other Ambulatory Visit: Payer: Self-pay

## 2024-08-07 ENCOUNTER — Emergency Department (HOSPITAL_COMMUNITY)
Admission: EM | Admit: 2024-08-07 | Discharge: 2024-08-07 | Disposition: A | Discharge status: Home / Self Care | Attending: Emergency Medicine | Admitting: Emergency Medicine

## 2024-08-07 ENCOUNTER — Encounter (HOSPITAL_COMMUNITY): Payer: Self-pay

## 2024-08-07 ENCOUNTER — Emergency Department (HOSPITAL_COMMUNITY)

## 2024-08-07 DIAGNOSIS — T148XXA Other injury of unspecified body region, initial encounter: Secondary | ICD-10-CM

## 2024-08-07 DIAGNOSIS — I10 Essential (primary) hypertension: Secondary | ICD-10-CM | POA: Insufficient documentation

## 2024-08-07 DIAGNOSIS — M5136 Other intervertebral disc degeneration, lumbar region with discogenic back pain only: Secondary | ICD-10-CM | POA: Diagnosis not present

## 2024-08-07 DIAGNOSIS — S39012A Strain of muscle, fascia and tendon of lower back, initial encounter: Secondary | ICD-10-CM | POA: Diagnosis not present

## 2024-08-07 DIAGNOSIS — I2510 Atherosclerotic heart disease of native coronary artery without angina pectoris: Secondary | ICD-10-CM | POA: Insufficient documentation

## 2024-08-07 DIAGNOSIS — Z79899 Other long term (current) drug therapy: Secondary | ICD-10-CM | POA: Insufficient documentation

## 2024-08-07 DIAGNOSIS — X501XXA Overexertion from prolonged static or awkward postures, initial encounter: Secondary | ICD-10-CM | POA: Diagnosis not present

## 2024-08-07 DIAGNOSIS — Y9389 Activity, other specified: Secondary | ICD-10-CM | POA: Diagnosis not present

## 2024-08-07 DIAGNOSIS — M545 Low back pain, unspecified: Secondary | ICD-10-CM | POA: Diagnosis not present

## 2024-08-07 DIAGNOSIS — M48061 Spinal stenosis, lumbar region without neurogenic claudication: Secondary | ICD-10-CM | POA: Diagnosis not present

## 2024-08-07 MED ORDER — METHOCARBAMOL 500 MG PO TABS
500.0000 mg | ORAL_TABLET | Freq: Two times a day (BID) | ORAL | 0 refills | Status: AC
Start: 2024-08-07 — End: ?

## 2024-08-07 NOTE — Discharge Instructions (Addendum)
 Take muscle relaxer up to 2 times per day as needed.  Take Tylenol  and ibuprofen  alternating as needed at home.  Rest and ice as needed.  Follow-up with primary care for further evaluation if pain does not resolve.

## 2024-08-07 NOTE — ED Triage Notes (Signed)
 Pt reports he missed a step 3 weeks and caught himself and has had some right side lower back pain ever since.  Pt reports Monday it started to get worse and his PCP gave him some pain medicine and a muscle relaxer.  Pt reports he bent over in the shower and he has been is severe pain ever since.

## 2024-08-07 NOTE — ED Provider Notes (Signed)
 [place] EMERGENCY DEPARTMENT AT [facility] Provider Note   CSN: [id number] Arrival date & time: 08/07/24  1836     Patient presents with: Back Pain   Jeffrey Allen is a 68 y.o. male.   68 year old male presents to the ED with complaints of a near mechanical fall 3 weeks ago.  Patient was he tripped on a raised concrete while he was working outside and caught himself on a rail near a ladder.  Patient reports he felt a pop in his back and had right lower back pain ever since.  Patient denies any headache dizziness syncope or fall to the ground.  Patient was seen by his PCP and given steroids for the pain and swelling on Monday.  He reports he was in the shower today and cleaning his feet when the pain exacerbated while bending over.  Patient reports ever since he has not been able to move his back is normal.  Patient has significant history for CAD, hypertension, prediabetes  Prior to Admission medications   Medication Sig Start Date End Date Taking? Authorizing Provider  methocarbamol  (ROBAXIN ) 500 MG tablet Take 1 tablet (500 mg total) by mouth in the morning and at bedtime. 08/07/24  Yes Myriam Fonda RAMAN, PA-C  albuterol  (VENTOLIN  HFA) 108 (90 Base) MCG/ACT inhaler Inhale 2 puffs into the lungs every 6 (six) hours as needed. 03/09/24   Leath-Warren, Etta JINNY, NP  allopurinol  (ZYLOPRIM ) 300 MG tablet Take 0.5 tablets (150 mg total) by mouth daily. 05/18/21   Gosrani, Nimish C, MD  amiodarone  (PACERONE ) 200 MG tablet Take 100 mg (0.5 tablet) by mouth every other day. 11/28/23   Debera Jayson MATSU, MD  clopidogrel  (PLAVIX ) 75 MG tablet Take 1 tablet (75 mg total) by mouth daily. 12/06/23   Debera Jayson MATSU, MD  Evolocumab  (REPATHA ) 140 MG/ML SOSY Inject into the skin.    [provider]  gabapentin  (NEURONTIN ) 100 MG capsule TAKE 1 CAPSULE BY MOUTH THREE TIMES A DAY. Patient taking differently: Take 100 mg by mouth as needed. 09/22/21   Tobie Suzzane POUR, MD   HYDROcodone -acetaminophen  (NORCO/VICODIN) 5-325 MG tablet Take 1 tablet by mouth every 6 (six) hours as needed. 06/20/24   Idol, Julie, PA-C  losartan  (COZAAR ) 25 MG tablet Take 1 tablet (25 mg total) by mouth daily. 07/19/22   Strader, Brittany M, PA-C  nitroGLYCERIN  (NITROSTAT ) 0.4 MG SL tablet Place 1 tablet (0.4 mg total) under the tongue every 5 (five) minutes as needed for chest pain. 05/27/22   Henry Manuelita NOVAK, NP  pantoprazole  (PROTONIX ) 40 MG tablet Take 1 tablet (40 mg total) by mouth daily. 07/14/21   Gosrani, Nimish C, MD  promethazine -dextromethorphan (PROMETHAZINE -DM) 6.25-15 MG/5ML syrup Take 5 mLs by mouth 4 (four) times daily as needed. 03/09/24   Leath-Warren, Etta JINNY, NP    Allergies: Prednisone  and Statins    Review of Systems  Musculoskeletal:  Positive for back pain.   Physical Exam Vitals and nursing note reviewed.  Constitutional:      Appearance: Normal appearance.  HENT:     Head: Normocephalic and atraumatic.     Nose: Nose normal.  Eyes:     Extraocular Movements: Extraocular movements intact.     Conjunctiva/sclera: Conjunctivae normal.     Pupils: Pupils are equal, round, and reactive to light.  Cardiovascular:     Rate and Rhythm: Normal rate.  Pulmonary:     Effort: Pulmonary effort is normal. No respiratory distress.  Musculoskeletal:  General: Tenderness present.     Cervical back: Normal range of motion.     Comments: Pain to palpation on right flank.   Skin:    General: Skin is warm and dry.  Neurological:     General: No focal deficit present.     Mental Status: He is alert.  Psychiatric:        Mood and Affect: Mood normal.        Behavior: Behavior normal.     Updated Vital Signs BP (!) 157/99 (BP Location: Left Arm)   Pulse (!) 56   Temp 97.7 F (36.5 C) (Oral)   Resp 19   Ht 5' 7 (1.702 m)   Wt 85.7 kg   SpO2 98%   BMI 29.60 kg/m     (all labs ordered are listed, but only abnormal results are displayed) Labs  Reviewed - No data to display  EKG: None  Radiology: DG Lumbar Spine Complete Result Date: 08/07/2024 CLINICAL DATA:  Right lower back pain. EXAM: LUMBAR SPINE - COMPLETE 4+ VIEW COMPARISON:  None Available. FINDINGS: Five non-rib-bearing lumbar vertebra with diminutive ribs at T12. L5 is hemi transitional with enlarged left transverse process and pseudoarticulation with the sacrum. Slight straightening of normal lordosis without listhesis. Normal vertebral body heights. No evidence of acute fracture. Mild diffuse disc space narrowing and spurring. Facet hypertrophy L4-L5 and L5-S1. No visible pars defects or focal bone abnormality. The sacroiliac joints are congruent IMPRESSION: 1. Mild diffuse degenerative disc disease and lower lumbar facet hypertrophy. 2. Transitional lumbosacral anatomy with hemi transitional L5 vertebra. Electronically Signed   By: Andrea Gasman M.D.   On: 08/07/2024 20:31     Procedures   Medications Ordered in the ED - No data to display                               Medical Decision Making Amount and/or Complexity of Data Reviewed Radiology: ordered.  Risk Prescription drug management.  68 y.o. male presents to the ED for concern of Back Pain     This involves an extensive number of treatment options, and is a complaint that carries with it a high risk of complications and morbidity.  The differential diagnosis prior to evaluation includes, but is not limited to: Muscle strain, lumbar fracture, radiculopathy pain  This is not an exhaustive differential.   Past Medical History / Co-morbidities / Social History: Hx of CAD, hypertension, prediabetes, stents in heart. Social Determinants of Health include: None currently reported  Additional History:  Obtained by chart review.  Notably stroke and cardiac stents   Imaging Studies: I ordered imaging studies including x-ray lumbar spine.   I independently visualized and interpreted imaging which showed no  acute fractures, mild degenerative disease I agree with the radiologist interpretation.   ED Course / Critical Interventions: Pt well-appearing on exam.  Sitting in bed comfortable and supine position.  Patient advised since cleaning his feet today in the shower and he has not been able to move his back as normal.  Patient has a palpable knot in the right lower flank where patient reports pain.  Patient has been able to ambulate for the past 3 weeks since initial fall and has seen PCP given steroids with minimal relief.  Patient reports is just exacerbated while showering today.  No pain down thoracic or lumbar spine.  Patient confirmed there was no head trauma, syncope, dizziness on initial incident.  Patient is in good spirits and no obvious deficits. Upon reevaluation, patient agreed with treatment plan and already has an established follow-up with PCP next week which she was advised to keep.  Patient given muscle relaxer for treatment at home. I have reviewed the patients home medicines and have made adjustments as needed.  Disposition: Considered admission and after reviewing the patient's encounter today, I feel that the patient would benefit from discharge and follow-up with PCP.  Discussed course of treatment with the patient, whom demonstrated understanding.  Patient in agreement and has no further questions.    I discussed this case with my attending, Dr. Cleotilde, who agreed with the proposed treatment course and cosigned this note including patient's presenting symptoms, physical exam, and planned diagnostics and interventions.  Attending physician stated agreement with plan or made changes to plan which were implemented.     This chart was dictated using voice recognition software.  Despite best efforts to proofread, errors can occur which can change the documentation meaning.    Final diagnoses:  Acute right-sided low back pain without sciatica  Muscle strain    ED Discharge  Orders          Ordered    methocarbamol  (ROBAXIN ) 500 MG tablet  2 times daily        08/07/24 2116               Myriam Fonda GORMAN DEVONNA 08/07/24 2151    Cleotilde Rogue, MD 08/08/24 1256

## 2024-08-13 ENCOUNTER — Encounter: Payer: Self-pay | Admitting: Internal Medicine

## 2024-09-20 DIAGNOSIS — M545 Low back pain, unspecified: Secondary | ICD-10-CM | POA: Diagnosis not present

## 2024-09-20 DIAGNOSIS — Z0001 Encounter for general adult medical examination with abnormal findings: Secondary | ICD-10-CM | POA: Diagnosis not present

## 2024-09-20 DIAGNOSIS — E7800 Pure hypercholesterolemia, unspecified: Secondary | ICD-10-CM | POA: Diagnosis not present

## 2024-09-20 DIAGNOSIS — Z8739 Personal history of other diseases of the musculoskeletal system and connective tissue: Secondary | ICD-10-CM | POA: Diagnosis not present

## 2024-09-20 DIAGNOSIS — Z1389 Encounter for screening for other disorder: Secondary | ICD-10-CM | POA: Diagnosis not present

## 2024-09-20 DIAGNOSIS — I2510 Atherosclerotic heart disease of native coronary artery without angina pectoris: Secondary | ICD-10-CM | POA: Diagnosis not present

## 2024-09-20 DIAGNOSIS — Z23 Encounter for immunization: Secondary | ICD-10-CM | POA: Diagnosis not present

## 2024-09-20 DIAGNOSIS — E7849 Other hyperlipidemia: Secondary | ICD-10-CM | POA: Diagnosis not present

## 2024-09-20 DIAGNOSIS — Z6830 Body mass index (BMI) 30.0-30.9, adult: Secondary | ICD-10-CM | POA: Diagnosis not present

## 2024-09-20 DIAGNOSIS — Z1331 Encounter for screening for depression: Secondary | ICD-10-CM | POA: Diagnosis not present

## 2024-09-20 DIAGNOSIS — R7303 Prediabetes: Secondary | ICD-10-CM | POA: Diagnosis not present

## 2024-10-27 ENCOUNTER — Other Ambulatory Visit: Payer: Self-pay | Admitting: Cardiology

## 2024-11-09 ENCOUNTER — Other Ambulatory Visit: Payer: Self-pay | Admitting: Physician Assistant

## 2024-12-23 NOTE — Progress Notes (Deleted)
 " Cardiology Office Note:  .   Date:  12/23/2024  ID:  FRAZIER BALFOUR, DOB [birth year], MRN [id number] PCP: Trudy Vaughn FALCON, MD  [facility] Providers Cardiologist:  Jayson Sierras, MD Electrophysiologist:  Will Gladis Norton, MD { Click to update primary MD,subspecialty MD or APP then REFRESH:1}   History of Present Illness: .   Jeffrey Allen is a 68 y.o. male   with past medical history of CAD (s/p cath on 05/26/2022 showing severe 3-vessel CAD with DESx1 to mid/distal LAD, DESx1 to D1 and DESx2 to distal LCx), chronic HFmrEF, PVC's, HTN, HLD, prediabetes and prior CVA   ROS: ***  Studies Reviewed: SABRA         Prior CV Studies: {Select studies to display:26339}   Cardiac Catheterization: 05/2022   Ost RCA to Prox RCA lesion is 50% stenosed.   Mid Cx lesion is 99% stenosed.   Prox Cx lesion is 70% stenosed.   1st Diag lesion is 80% stenosed.   Dist LAD lesion is 90% stenosed.   A drug-eluting stent was successfully placed using a STENT ONYX FRONTIER 2.25X18.   A drug-eluting stent was successfully placed using a STENT ONYX FRONTIER 3.0X12.   A drug-eluting stent was successfully placed using a SYNERGY XD 2.25X16.   A drug-eluting stent was successfully placed using a SYNERGY XD 2.25X12.   Post intervention, there is a 0% residual stenosis.   Post intervention, there is a 0% residual stenosis.   Post intervention, there is a 0% residual stenosis.   Post intervention, there is a 0% residual stenosis.   Severe mid to distal LAD stenosis. Successful PTCA/DES x 1 mid to distal LAD Severe Diagonal 1 stenosis. Successful PTCA/DES x 1 Diagonal Severe distal Circumflex stenosis. Successful PTCA/DES x 2 distal Circumflex Moderate ostial RCA stenosis. Large dominant RCA   Recommendations: Continue DAPT with ASA and Brilinta  for one year. Continue statin and beta blocker.    Echocardiogram: 10/2023 IMPRESSIONS     1. Left ventricular ejection fraction, by estimation, is  45 to 50%. The  left ventricle has mildly decreased function. The left ventricle  demonstrates global hypokinesis. The left ventricular internal cavity size  was mildly dilated. Left ventricular  diastolic parameters were normal.   2. Right ventricular systolic function is normal. The right ventricular  size is normal.   3. Left atrial size was moderately dilated.   4. The mitral valve is abnormal. Trivial mitral valve regurgitation. No  evidence of mitral stenosis.   5. The aortic valve is tricuspid. Aortic valve regurgitation is not  visualized. No aortic stenosis is present.   6. The inferior vena cava is normal in size with greater than 50%  respiratory variability, suggesting right atrial pressure of 3 mmHg.      Event Monitor: 11/2023 ZIO monitor reviewed.  3 days, 3 hours analyzed.   Predominant rhythm is sinus with heart rate ranging from 35 bpm up to 111 bpm and average heart rate 73 bpm. There were occasional PACs representing 2.7% total beats with otherwise rare atrial couplets and triplets. There were frequent PVCs representing 14.2% total beats.  Also frequent ventricular couplets representing 5.4% total beats.  Otherwise, there were rare ventricular couplets including episodes of ventricular bigeminy and trigeminy. Multiple episodes of NSVT were noted (179) with the longest event lasting 11 beats.  No sustained ventricular events. No pauses or high degree heart block.    Risk Assessment/Calculations:   {Does this patient have ATRIAL FIBRILLATION?[phone]} No  BP recorded.  {Refresh Note OR Click here to enter BP  :1}***       Physical Exam:   VS:  There were no vitals taken for this visit.   Orhtostatics: No data found. Wt Readings from Last 3 Encounters:  08/07/24 189 lb (85.7 kg)  06/20/24 192 lb 0.3 oz (87.1 kg)  02/28/24 192 lb (87.1 kg)    GEN: Well nourished, well developed in no acute distress NECK: No JVD; No carotid bruits CARDIAC: ***RRR, no murmurs,  rubs, gallops RESPIRATORY:  Clear to auscultation without rales, wheezing or rhonchi  ABDOMEN: Soft, non-tender, non-distended EXTREMITIES:  No edema; No deformity   ASSESSMENT AND PLAN: .    CAD - Prior cardiac catheterization in 05/2022 showed severe three-vessel CAD and he underwent PCI with DESx1 to mid/distal LAD, DESx1 to D1 and DESx2 to distal LCx. He remains active at baseline and denies any recent anginal symptoms. He has been continued on Plavix  75 mg daily given his stent burden. Continue Repatha  and Losartan  25 mg daily. Previously intolerant to statins and not on a beta-blocker due to bradycardia.   2. Chronic HFmrEF - His EF was at 45 to 50% by echocardiogram in 10/2023 which is overall similar to prior imaging. He has remained on Losartan  25 mg daily. Previously not interested in additional GDMT and wishes to keep a simplified medication regimen. He has not been on a beta-blocker due to bradycardia.   3. HTN - BP is well-controlled at 122/82 during today's visit. Continue Losartan  25 mg daily.    4. HLD - LDL was at 89 when checked in 11/2023. He has remained on Repatha  as he was previously intolerant to statin therapy.   5. PVC's - Most recent monitor in 11/2023 showed that his PVC burden was at 14.2%. He has been on Amiodarone  100 mg every other day. He is unsure if he wants to continue this long-term or switch to a different option. By review of EP notes, it was recommended to continue low-dose Amiodarone  and if intolerances in the future, could potentially start Mexiletine or consider PVC ablation in the future but all medications would need to be stopped. Reviewed possible referral back to EP today but he wishes to continue his current regimen for now. Encouraged him to reach out in the interim if wishing to pursue this. LFT's were WNL in 11/2023 and will request a copy of his recent labs from his PCP to make sure TSH has been obtained (was WNL in 01/2023).       {Are you  ordering a CV Procedure (e.g. stress test, cath, DCCV, TEE, etc)?   Press F2        :[id number]}  Dispo: ***  Signed, Olivia Pavy, PA-C   "

## 2024-12-30 ENCOUNTER — Ambulatory Visit
Admission: EM | Admit: 2024-12-30 | Discharge: 2024-12-30 | Disposition: A | Discharge status: Home / Self Care | Attending: Family Medicine | Admitting: Family Medicine

## 2024-12-30 ENCOUNTER — Telehealth: Payer: Self-pay | Admitting: Physician Assistant

## 2024-12-30 DIAGNOSIS — J309 Allergic rhinitis, unspecified: Secondary | ICD-10-CM | POA: Diagnosis not present

## 2024-12-30 MED ORDER — AZELASTINE HCL 0.1 % NA SOLN: 1.0000 | Freq: Two times a day (BID) | NASAL | 0 refills | Status: AC

## 2024-12-30 MED ORDER — BENZONATATE 200 MG PO CAPS: 200.0000 mg | ORAL_CAPSULE | Freq: Three times a day (TID) | ORAL | 0 refills | Status: AC | PRN
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
## 2024-12-30 NOTE — Telephone Encounter (Signed)
 Pt says her dad has been sick but doesn't have a fever. Wants to know if its okay to come to appt tomorrow. Please advise.

## 2024-12-30 NOTE — ED Provider Notes (Signed)
 " [facility]    CSN: [id number] Arrival date & time: 12/30/24  1036      History   Chief Complaint No chief complaint on file.   HPI Jeffrey Allen is a 69 y.o. male.   Patient presenting today with several day history of rhinorrhea, cough, occasional chest tightness.  Notes symptoms started after working with a wood stove with significant smoking debris in the air.  Denies fever, chills, wheezing, shortness of breath, abdominal pain, vomiting, diarrhea.  So far not trying anything over-the-counter for symptoms other than Mucinex .   Past Medical History:  Diagnosis Date   Bradycardia    CAD (coronary artery disease)    a. 05/2022: DES placement x1 to the mid/distal LAD, DES placement x1 to D1 and DES placement x2 to the distal LCx   GERD (gastroesophageal reflux disease)    Gout    Heart failure with mildly reduced ejection fraction (HFmrEF) (HCC)    High cholesterol    NSVT (nonsustained ventricular tachycardia) (HCC)    Obesity (BMI 30.0-34.9)    Peptic ulcer disease    Prediabetes    PVC's (premature ventricular contractions)    Stroke (HCC) 06/2017   SVT (supraventricular tachycardia)    Ureterolithiasis     Patient Active Problem List   Diagnosis Date Noted   CAD S/P percutaneous coronary angioplasty/Multi-Vessel CAD/Stents 05/26/22 06/26/2022   Acute exacerbation of CHF (congestive heart failure) (HCC) 06/23/2022   PVC (premature ventricular contraction)/PACs 05/27/2022   Unstable angina (HCC)    Diverticulitis of colon 10/07/2020   History of colonic polyps 10/07/2020   Vitamin D  deficiency 02/10/2020   Chest pain 12/30/2019   COVID-19 virus infection 12/30/2019   NSTEMI (non-ST elevated myocardial infarction) (HCC) 12/29/2019   Prediabetes 11/07/2019   Hypogonadism, male 11/07/2019   Obesity (BMI 30.0-34.9) 11/07/2019   Stroke (HCC)    Essential hypertension 06/19/2018   Mixed hyperlipidemia 06/19/2018   Gout 06/18/2018   GERD  (gastroesophageal reflux disease) 06/18/2018   Acute CVA (cerebrovascular accident) (HCC) 06/18/2018   CARPAL TUNNEL SYNDROME 04/06/2009   CUBITAL TUNNEL SYNDROME 04/06/2009   NECK PAIN, CHRONIC 04/06/2009   Disturbance of skin sensation 04/06/2009   Sprain of ankle 12/03/2008    Past Surgical History:  Procedure Laterality Date   BIOPSY  11/25/2020   Procedure: BIOPSY;  Surgeon: Shaaron Lamar HERO, MD;  Location: [facility];  Service: Endoscopy;;  gastric   COLONOSCOPY     COLONOSCOPY N/A 12/07/2016   Scattered small and large-mouthed diverticula in sigmoid and descending colon. Six semi-pedunculated polyps in sigmoid and ascending colon, 4-6 mm in size. Distal TI normal appearing. Hyperplastic and tubular adenomas. 3 year surveillance recommended.    COLONOSCOPY N/A 11/25/2020   diverticulosis in sigmoid and descending colon, three 3-4 mm polyps at rectosigmoid colon and hepatic flexure. Hyperplastic polyp. 5 year colonoscopy.    CORONARY STENT INTERVENTION N/A 05/26/2022   Procedure: CORONARY STENT INTERVENTION;  Surgeon: Verlin Lonni BIRCH, MD;  Location: [facility];  Service: Cardiovascular;  Laterality: N/A;   ESOPHAGOGASTRODUODENOSCOPY N/A 11/25/2020     normal esophagus, portal gastropathy, normal duodenum. Mild chronic gastritis, negative H.pylori.    LEFT HEART CATH AND CORS/GRAFTS ANGIOGRAPHY N/A 05/26/2022   Procedure: LEFT HEART CATH AND CORS/GRAFTS ANGIOGRAPHY;  Surgeon: Verlin Lonni BIRCH, MD;  Location: [facility];  Service: Cardiovascular;  Laterality: N/A;   POLYPECTOMY  11/25/2020   Procedure: POLYPECTOMY;  Surgeon: Shaaron Lamar HERO, MD;  Location: [facility];  Service: Endoscopy;;   TOOTH EXTRACTION         Home Medications    Prior to Admission medications  Medication Sig Start Date End Date Taking? Authorizing Provider  azelastine  (ASTELIN ) 0.1 % nasal spray Place 1 spray into both nostrils 2 (two) times daily. Use in each nostril  as directed 12/30/24  Yes Stuart Vernell Norris, PA-C  benzonatate  (TESSALON ) 200 MG capsule Take 1 capsule (200 mg total) by mouth 3 (three) times daily as needed for cough. 12/30/24  Yes Stuart Vernell Norris, PA-C  albuterol  (VENTOLIN  HFA) 108 (90 Base) MCG/ACT inhaler Inhale 2 puffs into the lungs every 6 (six) hours as needed. 03/09/24   Leath-Warren, Etta PARAS, NP  allopurinol  (ZYLOPRIM ) 300 MG tablet Take 0.5 tablets (150 mg total) by mouth daily. 05/18/21   Gosrani, Nimish C, MD  amiodarone  (PACERONE ) 200 MG tablet TAKE 1/2 TABLET BY MOUTH EVERY OTHER DAY. 11/12/24   Strader, Laymon HERO, PA-C  clopidogrel  (PLAVIX ) 75 MG tablet TAKE ONE TABLET BY MOUTH EVERY DAY 10/29/24   Strader, Brittany M, PA-C  Evolocumab  (REPATHA ) 140 MG/ML SOSY Inject into the skin.    [provider]  gabapentin  (NEURONTIN ) 100 MG capsule TAKE 1 CAPSULE BY MOUTH THREE TIMES A DAY. Patient taking differently: Take 100 mg by mouth as needed. 09/22/21   Tobie Suzzane POUR, MD  HYDROcodone -acetaminophen  (NORCO/VICODIN) 5-325 MG tablet Take 1 tablet by mouth every 6 (six) hours as needed. 06/20/24   Idol, Julie, PA-C  losartan  (COZAAR ) 25 MG tablet Take 1 tablet (25 mg total) by mouth daily. 07/19/22   Strader, Laymon HERO, PA-C  methocarbamol  (ROBAXIN ) 500 MG tablet Take 1 tablet (500 mg total) by mouth in the morning and at bedtime. 08/07/24   Myriam Fonda RAMAN, PA-C  nitroGLYCERIN  (NITROSTAT ) 0.4 MG SL tablet Place 1 tablet (0.4 mg total) under the tongue every 5 (five) minutes as needed for chest pain. 05/27/22   Henry Manuelita NOVAK, NP  pantoprazole  (PROTONIX ) 40 MG tablet Take 1 tablet (40 mg total) by mouth daily. 07/14/21   Gosrani, Nimish C, MD  promethazine -dextromethorphan (PROMETHAZINE -DM) 6.25-15 MG/5ML syrup Take 5 mLs by mouth 4 (four) times daily as needed. 03/09/24   Leath-Warren, Etta PARAS, NP    Family History Family History  Problem Relation Age of Onset   Dementia Mother    Hypertension Father    Diabetes  Father    COPD Father    Hyperlipidemia Father    Heart attack Father    Colon cancer Neg Hx     Social History Social History[1]   Allergies   Prednisone  and Statins   Review of Systems Review of Systems PER HPI  Physical Exam Triage Vital Signs ED Triage Vitals  Encounter Vitals Group     BP 12/30/24 1159 132/82     Girls Systolic BP Percentile --      Girls Diastolic BP Percentile --      Boys Systolic BP Percentile --      Boys Diastolic BP Percentile --      Pulse Rate 12/30/24 1159 82     Resp 12/30/24 1159 20     Temp 12/30/24 1159 99.1 F (37.3 C)     Temp Source 12/30/24 1159 Oral     SpO2 12/30/24 1159 94 %     Weight --      Height --      Head Circumference --      Peak Flow --  Pain Score 12/30/24 1200 3     Pain Loc --      Pain Education --      Exclude from Growth Chart --    No data found.  Updated Vital Signs BP 132/82 (BP Location: Right Arm)   Pulse 82   Temp 99.1 F (37.3 C) (Oral)   Resp 20   SpO2 94%   Visual Acuity Right Eye Distance:   Left Eye Distance:   Bilateral Distance:    Right Eye Near:   Left Eye Near:    Bilateral Near:     Physical Exam Vitals and nursing note reviewed.  Constitutional:      Appearance: He is well-developed.  HENT:     Head: Atraumatic.     Right Ear: Tympanic membrane and external ear normal.     Left Ear: Tympanic membrane and external ear normal.     Nose: Rhinorrhea present.     Mouth/Throat:     Pharynx: No oropharyngeal exudate or posterior oropharyngeal erythema.  Eyes:     Conjunctiva/sclera: Conjunctivae normal.     Pupils: Pupils are equal, round, and reactive to light.  Cardiovascular:     Rate and Rhythm: Normal rate and regular rhythm.  Pulmonary:     Effort: Pulmonary effort is normal. No respiratory distress.     Breath sounds: No wheezing or rales.  Musculoskeletal:        General: Normal range of motion.     Cervical back: Normal range of motion and neck supple.   Lymphadenopathy:     Cervical: No cervical adenopathy.  Skin:    General: Skin is warm and dry.  Neurological:     Mental Status: He is alert and oriented to person, place, and time.  Psychiatric:        Behavior: Behavior normal.      UC Treatments / Results  Labs (all labs ordered are listed, but only abnormal results are displayed) Labs Reviewed - No data to display  EKG   Radiology No results found.  Procedures Procedures (including critical care time)  Medications Ordered in UC Medications - No data to display  Initial Impression / Assessment and Plan / UC Course  I have reviewed the triage vital signs and the nursing notes.  Pertinent labs & imaging results that were available during my care of the patient were reviewed by me and considered in my medical decision making (see chart for details).     Vitals and exam very reassuring today, he is well-appearing and in no acute distress.  Suspect allergic sinusitis.  Per wife he may have had a reaction once to Zyrtec so he refuses antihistamines going forward.  Treat with Astelin , Tessalon , supportive over-the-counter medications and home care.  Return for worsening or unresolving symptoms.  Final Clinical Impressions(s) / UC Diagnoses   Final diagnoses:  Allergic sinusitis     Discharge Instructions      In addition to prescribed medications, you may use Coricidin HBP, plain Mucinex , use humidifiers and saline sinus rinses.  Return for worsening or unresolving symptoms.    ED Prescriptions     Medication Sig Dispense Auth. Provider   azelastine  (ASTELIN ) 0.1 % nasal spray Place 1 spray into both nostrils 2 (two) times daily. Use in each nostril as directed 30 mL Stuart Vernell Norris, PA-C   benzonatate  (TESSALON ) 200 MG capsule Take 1 capsule (200 mg total) by mouth 3 (three) times daily as needed for cough. 20 capsule Stuart,  Vernell Norris, PA-C      PDMP not reviewed this encounter.    [1]   Social History Tobacco Use   Smoking status: Former    Current packs/day: 0.00    Average packs/day: (0.5 ttl pk-yrs)    Types: Cigarettes    Start date: 12/26/1981    Quit date: 12/26/2006    Years since quitting: 18.0   Smokeless tobacco: Never  Vaping Use   Vaping status: Never Used  Substance Use Topics   Alcohol use: Not Currently   Drug use: Not Currently    Types: Marijuana     Stuart Vernell Norris, [place] 12/30/24 1415  "

## 2024-12-30 NOTE — Discharge Instructions (Signed)
 In addition to prescribed medications, you may use Coricidin HBP, plain Mucinex , use humidifiers and saline sinus rinses.  Return for worsening or unresolving symptoms.

## 2024-12-30 NOTE — Telephone Encounter (Signed)
 Patient's wife notified of Olivia Lenze's response. They will call back to reschedule.

## 2024-12-31 ENCOUNTER — Ambulatory Visit: Admitting: Physician Assistant

## 2025-01-30 ENCOUNTER — Ambulatory Visit: Admitting: Cardiology
